# Patient Record
Sex: Female | Born: 1965 | Race: Black or African American | Hispanic: No | Marital: Married | Smoking: Never smoker
Health system: Southern US, Community
[De-identification: ages and names within clinical notes are randomized; demographics above are authoritative.]

## PROBLEM LIST (undated history)

## (undated) DIAGNOSIS — G43909 Migraine, unspecified, not intractable, without status migrainosus: Secondary | ICD-10-CM

## (undated) DIAGNOSIS — K219 Gastro-esophageal reflux disease without esophagitis: Secondary | ICD-10-CM

## (undated) HISTORY — PX: CERVIX SURGERY: SHX593

## (undated) HISTORY — PX: HERNIA REPAIR: SHX51

---

## 2015-11-17 ENCOUNTER — Ambulatory Visit (HOSPITAL_COMMUNITY)
Admission: EM | Admit: 2015-11-17 | Discharge: 2015-11-17 | Disposition: A | Discharge status: Home / Self Care | Payer: Worker's Compensation | Attending: Emergency Medicine | Admitting: Emergency Medicine

## 2015-11-17 ENCOUNTER — Ambulatory Visit (HOSPITAL_COMMUNITY): Payer: Worker's Compensation

## 2015-11-17 ENCOUNTER — Encounter (HOSPITAL_COMMUNITY): Payer: Self-pay | Admitting: Emergency Medicine

## 2015-11-17 DIAGNOSIS — Z79899 Other long term (current) drug therapy: Secondary | ICD-10-CM | POA: Insufficient documentation

## 2015-11-17 DIAGNOSIS — Z9889 Other specified postprocedural states: Secondary | ICD-10-CM | POA: Insufficient documentation

## 2015-11-17 DIAGNOSIS — M545 Low back pain: Secondary | ICD-10-CM | POA: Diagnosis present

## 2015-11-17 DIAGNOSIS — M533 Sacrococcygeal disorders, not elsewhere classified: Secondary | ICD-10-CM | POA: Insufficient documentation

## 2015-11-17 HISTORY — DX: Migraine, unspecified, not intractable, without status migrainosus: G43.909

## 2015-11-17 HISTORY — DX: Gastro-esophageal reflux disease without esophagitis: K21.9

## 2015-11-17 MED ORDER — MELOXICAM 7.5 MG PO TABS: 7.5000 mg | ORAL_TABLET | Freq: Every day | ORAL | Status: DC
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
## 2015-11-17 NOTE — ED Notes (Signed)
Patient sent to hospital radiology, called prior to leaving to notify radiology department

## 2015-11-17 NOTE — ED Notes (Addendum)
1 page work- related paper xeroxed to be scanned into record

## 2015-11-17 NOTE — ED Notes (Signed)
[facility] returned to [facility]

## 2015-11-17 NOTE — Discharge Instructions (Signed)
Tailbone Injury The tailbone is the small bone at the lower end of the backbone (spine). You may have stretched tissues, bruises, or a broken bone (fracture). These injuries can be painful. Most tailbone injuries get better on their own in 4-6 weeks. HOME CARE  Take Meloxicam daily for the next month.  Apply ice to the injured area.  Put ice in a plastic bag.  Place a towel between your skin and the bag.  Leave the ice on for 20 minutes, 2-3 times per day. Do this for the first 1-2 days.  Sit on a large, rubber or inflated ring or cushion to lessen pain. You can also use a wedge cushion. Lean forward when you sit to help lessen pain.  Avoid sitting in one place for a long time.  Increase your activity as the pain allows.  Do exercises as told by your doctor or physical therapist.  If it is painful to poop, take medicine to help you poop (stool softeners) as told by your doctor.  Eat foods that have plenty of fiber.  Keep all follow-up visits as told by your doctor. This is important. GET HELP IF:  Your pain gets worse.  Pooping causes you pain.  You cannot poop (constipation).  You are leaking pee (urinary incontinence).  You have a fever.   This information is not intended to replace advice given to you by your health care provider. Make sure you discuss any questions you have with your health care provider.   Document Released: 05/24/2010 Document Revised: 09/05/2014 Document Reviewed: 04/17/2014 Elsevier Interactive Patient Education [employer]2016 [employer]

## 2017-01-05 ENCOUNTER — Emergency Department (HOSPITAL_COMMUNITY)
Admission: EM | Admit: 2017-01-05 | Discharge: 2017-01-05 | Disposition: A | Discharge status: Home / Self Care | Payer: BLUE CROSS/BLUE SHIELD | Attending: Emergency Medicine | Admitting: Emergency Medicine

## 2017-01-05 ENCOUNTER — Emergency Department (HOSPITAL_COMMUNITY): Payer: BLUE CROSS/BLUE SHIELD

## 2017-01-05 ENCOUNTER — Encounter (HOSPITAL_COMMUNITY): Payer: Self-pay | Admitting: Emergency Medicine

## 2017-01-05 DIAGNOSIS — Z79899 Other long term (current) drug therapy: Secondary | ICD-10-CM | POA: Diagnosis not present

## 2017-01-05 DIAGNOSIS — Y999 Unspecified external cause status: Secondary | ICD-10-CM | POA: Diagnosis not present

## 2017-01-05 DIAGNOSIS — S161XXA Strain of muscle, fascia and tendon at neck level, initial encounter: Secondary | ICD-10-CM | POA: Diagnosis not present

## 2017-01-05 DIAGNOSIS — S199XXA Unspecified injury of neck, initial encounter: Secondary | ICD-10-CM | POA: Diagnosis present

## 2017-01-05 DIAGNOSIS — Y9389 Activity, other specified: Secondary | ICD-10-CM | POA: Insufficient documentation

## 2017-01-05 DIAGNOSIS — Y929 Unspecified place or not applicable: Secondary | ICD-10-CM | POA: Diagnosis not present

## 2017-01-05 DIAGNOSIS — M25512 Pain in left shoulder: Secondary | ICD-10-CM

## 2017-01-05 MED ORDER — CYCLOBENZAPRINE HCL 10 MG PO TABS: 10.0000 mg | ORAL_TABLET | Freq: Three times a day (TID) | ORAL | 0 refills | Status: DC | PRN

## 2017-01-05 MED ORDER — NAPROXEN 375 MG PO TABS: 375.0000 mg | ORAL_TABLET | Freq: Two times a day (BID) | ORAL | 0 refills | Status: DC

## 2017-01-05 NOTE — Discharge Instructions (Signed)
Your imaging has been reassuring. Likely musculoskeletal pain.  Please rest, ice, compress and elevated the affected body part to help with swelling and pain.  Please take the Naproxen as prescribed for pain. Do not take any additional NSAIDs including Motrin, Aleve, Ibuprofen, Advil.  Please the the Flexeril for muscle relaxation. This medication will make you drowsy so avoid situation that could place you in danger.   Soaking in warm bath with epson sault, and applying heat can help your pain.   Please follow-up with her primary care doctor for further imaging and referral if indicated.
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
## 2017-01-05 NOTE — ED Notes (Signed)
Pt taken to Xray.

## 2017-07-04 ENCOUNTER — Encounter (HOSPITAL_COMMUNITY): Payer: Self-pay | Admitting: Emergency Medicine

## 2017-07-04 ENCOUNTER — Ambulatory Visit (HOSPITAL_COMMUNITY)
Admission: EM | Admit: 2017-07-04 | Discharge: 2017-07-04 | Disposition: A | Discharge status: Home / Self Care | Payer: Worker's Compensation | Attending: Family Medicine | Admitting: Family Medicine

## 2017-07-04 DIAGNOSIS — S46811A Strain of other muscles, fascia and tendons at shoulder and upper arm level, right arm, initial encounter: Secondary | ICD-10-CM

## 2017-07-04 MED ORDER — TRAMADOL HCL 50 MG PO TABS: 50.0000 mg | ORAL_TABLET | Freq: Four times a day (QID) | ORAL | 0 refills | Status: AC | PRN
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
## 2017-07-04 NOTE — Discharge Instructions (Signed)
Heat (pad or rice pillow in microwave) over affected area, 10-15 minutes twice daily.   Ice/cold pack over area for 10-15 min twice daily.  OK to take Tylenol 1000 mg (2 extra strength tabs) or 975 mg (3 regular strength tabs) every 6 hours as needed.  Ibuprofen 400-600 mg (2-3 over the counter strength tabs) every 6 hours as needed for pain.  Trapezius stretches/exercises Do exercises exactly as told by your health care provider and adjust them as directed. It is normal to feel mild stretching, pulling, tightness, or discomfort as you do these exercises, but you should stop right away if you feel sudden pain or your pain gets worse.   Stretching and range of motion exercises These exercises warm up your muscles and joints and improve the movement and flexibility of your shoulder. These exercises can also help to relieve pain, numbness, and tingling. If you are unable to do any of the following for any reason, do not further attempt to do it.   Exercise A: Flexion, standing     Stand and hold a broomstick, a cane, or a similar object. Place your hands a little more than shoulder-width apart on the object. Your left / right hand should be palm-up, and your other hand should be palm-down. Push the stick to raise your left / right arm out to your side and then over your head. Use your other hand to help move the stick. Stop when you feel a stretch in your shoulder, or when you reach the angle that is recommended by your health care provider. Avoid shrugging your shoulder while you raise your arm. Keep your shoulder blade tucked down toward your spine. Hold for 30 seconds. Slowly return to the starting position. Repeat 2 times. Complete this exercise 3 times per week.  Exercise B: Abduction, supine     Lie on your back and hold a broomstick, a cane, or a similar object. Place your hands a little more than shoulder-width apart on the object. Your left / right hand should be palm-up, and your  other hand should be palm-down. Push the stick to raise your left / right arm out to your side and then over your head. Use your other hand to help move the stick. Stop when you feel a stretch in your shoulder, or when you reach the angle that is recommended by your health care provider. Avoid shrugging your shoulder while you raise your arm. Keep your shoulder blade tucked down toward your spine. Hold for 30 seconds. Slowly return to the starting position. Repeat 2 times. Complete this exercise 3 times per week.  Exercise C: Flexion, active-assisted     Lie on your back. You may bend your knees for comfort. Hold a broomstick, a cane, or a similar object. Place your hands about shoulder-width apart on the object. Your palms should face toward your feet. Raise the stick and move your arms over your head and behind your head, toward the floor. Use your healthy arm to help your left / right arm move farther. Stop when you feel a gentle stretch in your shoulder, or when you reach the angle where your health care provider tells you to stop. Hold for 30 seconds. Slowly return to the starting position. Repeat 2 times. Complete this exercise 3 times per week.  Exercise D: External rotation and abduction     Stand in a door frame with one of your feet slightly in front of the other. This is called a staggered  stance. Choose one of the following positions as told by your health care provider: Place your hands and forearms on the door frame above your head. Place your hands and forearms on the door frame at the height of your head. Place your hands on the door frame at the height of your elbows. Slowly move your weight onto your front foot until you feel a stretch across your chest and in the front of your shoulders. Keep your head and chest upright and keep your abdominal muscles tight. Hold for 30 seconds. To release the stretch, shift your weight to your back foot. Repeat 2 times. Complete this  stretch 3 times per week.  Strengthening exercises These exercises build strength and endurance in your shoulder. Endurance is the ability to use your muscles for a long time, even after your muscles get tired. Exercise E: Scapular depression and adduction  Sit on a stable chair. Support your arms in front of you with pillows, armrests, or a tabletop. Keep your elbows in line with the sides of your body. Gently move your shoulder blades down toward your middle back. Relax the muscles on the tops of your shoulders and in the back of your neck. Hold for 3 seconds. Slowly release the tension and relax your muscles completely before doing this exercise again. Repeat for a total of 10 repetitions. After you have practiced this exercise, try doing the exercise without the arm support. Then, try the exercise while standing instead of sitting. Repeat 2 times. Complete this exercise 3 times per week.  Exercise F: Shoulder abduction, isometric     Stand or sit about 4-6 inches (10-15 cm) from a wall with your left / right side facing the wall. Bend your left / right elbow and gently press your elbow against the wall. Increase the pressure slowly until you are pressing as hard as you can without shrugging your shoulder. Hold for 3 seconds. Slowly release the tension and relax your muscles completely. Repeat for a total of 10 repetitions. Repeat 2 times. Complete this exercise 3 times per week.  Exercise G: Shoulder flexion, isometric     Stand or sit about 4-6 inches (10-15 cm) away from a wall with your left / right side facing the wall. Keep your left / right elbow straight and gently press the top of your fist against the wall. Increase the pressure slowly until you are pressing as hard as you can without shrugging your shoulder. Hold for 10-15 seconds. Slowly release the tension and relax your muscles completely. Repeat for a total of 10 repetitions. Repeat 2 times. Complete this exercise 3  times per week.  Exercise H: Internal rotation     Sit in a stable chair without armrests, or stand. Secure an exercise band at your left / right side, at elbow height. Place a soft object, such as a folded towel or a small pillow, under your left / right upper arm so your elbow is a few inches (about 8 cm) away from your side. Hold the end of the exercise band so the band stretches. Keeping your elbow pressed against the soft object under your arm, move your forearm across your body toward your abdomen. Keep your body steady so the movement is only coming from your shoulder. Hold for 3 seconds. Slowly return to the starting position. Repeat for a total of 10 repetitions. Repeat 2 times. Complete this exercise 3 times per week.  Exercise I: External rotation     Sit in  a stable chair without armrests, or stand. Secure an exercise band at your left / right side, at elbow height. Place a soft object, such as a folded towel or a small pillow, under your left / right upper arm so your elbow is a few inches (about 8 cm) away from your side. Hold the end of the exercise band so the band stretches. Keeping your elbow pressed against the soft object under your arm, move your forearm out, away from your abdomen. Keep your body steady so the movement is only coming from your shoulder. Hold for 3 seconds. Slowly return to the starting position. Repeat for a total of 10 repetitions. Repeat 2 times. Complete this exercise 3 times per week. Exercise J: Shoulder extension  Sit in a stable chair without armrests, or stand. Secure an exercise band to a stable object in front of you so the band is at shoulder height. Hold one end of the exercise band in each hand. Your palms should face each other. Straighten your elbows and lift your hands up to shoulder height. Step back, away from the secured end of the exercise band, until the band stretches. Squeeze your shoulder blades together and pull your hands  down to the sides of your thighs. Stop when your hands are straight down by your sides. Do not let your hands go behind your body. Hold for 3 seconds. Slowly return to the starting position. Repeat for a total of 10 repetitions. Repeat 2 times. Complete this exercise 3 times per week.  Exercise K: Shoulder extension, prone     Lie on your abdomen on a firm surface so your left / right arm hangs over the edge. Hold a 5 lb weight in your hand so your palm faces in toward your body. Your arm should be straight. Squeeze your shoulder blade down toward the middle of your back. Slowly raise your arm behind you, up to the height of the surface that you are lying on. Keep your arm straight. Hold for 3 seconds. Slowly return to the starting position and relax your muscles. Repeat for a total of 10 repetitions. Repeat 2 times. Complete this exercise 3 times per week.   Exercise L: Horizontal abduction, prone  Lie on your abdomen on a firm surface so your left / right arm hangs over the edge. Hold a 5 lb weight in your hand so your palm faces toward your feet. Your arm should be straight. Squeeze your shoulder blade down toward the middle of your back. Bend your elbow so your hand moves up, until your elbow is bent to an "L" shape (90 degrees). With your elbow bent, slowly move your forearm forward and up. Raise your hand up to the height of the surface that you are lying on. Your upper arm should not move, and your elbow should stay bent. At the top of the movement, your palm should face the floor. Hold for 3 seconds. Slowly return to the starting position and relax your muscles. Repeat for a total of 10 repetitions. Repeat 2 times. Complete this exercise 3 times per week.  Exercise M: Horizontal abduction, standing  Sit on a stable chair, or stand. Secure an exercise band to a stable object in front of you so the band is at shoulder height. Hold one end of the exercise band in each  hand. Straighten your elbows and lift your hands straight in front of you, up to shoulder height. Your palms should face down, toward the floor.  Step back, away from the secured end of the exercise band, until the band stretches. Move your arms out to your sides, and keep your arms straight. Hold for 3 seconds. Slowly return to the starting position. Repeat for a total of 10 repetitions. Repeat 2 times. Complete this exercise 3 times per week.  Exercise N: Scapular retraction and elevation  Sit on a stable chair, or stand. Secure an exercise band to a stable object in front of you so the band is at shoulder height. Hold one end of the exercise band in each hand. Your palms should face each other. Sit in a stable chair without armrests, or stand. Step back, away from the secured end of the exercise band, until the band stretches. Squeeze your shoulder blades together and lift your hands over your head. Keep your elbows straight. Hold for 3 seconds. Slowly return to the starting position. Repeat for a total of 10 repetitions. Repeat 2 times. Complete this exercise 3 times per week.  This information is not intended to replace advice given to you by your health care provider. Make sure you discuss any questions you have with your health care provider. Document Released: 04/21/2005 Document Revised: 12/27/2015 Document Reviewed: 03/08/2015 Elsevier Interactive Patient Education  2017 [employer]

## 2017-07-04 NOTE — ED Provider Notes (Addendum)
  [facility]    CSN: [id number] Arrival date & time: 07/04/17  1201  Musculoskeletal Exam  Patient: Carla Hines DOB: 07/18/1965  DOS: 07/04/2017  SUBJECTIVE:  Chief Complaint:   Chief Complaint  Patient presents with  . Arm Pain    Carla Hines is a 52 y.o.  female for evaluation and treatment of R arm pain.   Onset:  2 days ago.  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  Location: shoulder Character:  aching  Progression of issue:  is unchanged Associated symptoms: None Radiates to neck and down arm Treatment: to date has been OTC NSAIDS.   Neurovascular symptoms: no  ROS: Musculoskeletal/Extremities: +r arm pain Neuro: No numbness or tingling  Past Medical History:  Diagnosis Date  . Acid reflux   . Migraines     Objective: VITAL SIGNS: BP 103/63 (BP Location: Left Arm)   Pulse 74   Temp (!) 97.4 F (36.3 C)   Resp 18   SpO2 100%  Constitutional: Well formed, well developed. No acute distress. Cardiovascular: Brisk cap refill Thorax & Lungs: No accessory muscle use Musculoskeletal: R shoulder.   Normal active range of motion: yes.   Normal passive range of motion: yes Tenderness to palpation: yes, over the trapezius Deformity: no Ecchymosis: no Tests positive: none Tests negative: Neer's, Hawkins, empty can, liftoff, crossover, speeds, O'Brien's, Spurling's Neurologic: Normal sensory function. No focal deficits noted. DTR's equal and symmetry in UE's. No clonus. Psychiatric: Normal mood. Age appropriate judgment and insight. Alert & oriented x 3.    Assessment:  Strain of right trapezius muscle, initial encounter  Plan: Tramadol 50 mg, 1 tab q 12 hrs prn pain, disp 12. Do not drink alcohol, do any illicit/street drugs, drive or do anything that requires alertness while on this medicine.  Cont NSAIDs, Tylenol, ice, heat, stretches/exercises for trap given. Light duty for overhead,  pushing, and lifting for next week. F/u w occ health in 1 week if no better. The patient voiced understanding and agreement to the plan.    Sharlene DoryWendling, Nicholas Paul, DO 07/04/17 1239    Sharlene DoryWendling, Nicholas Paul, DO 07/04/17 1240

## 2020-01-07 ENCOUNTER — Emergency Department (HOSPITAL_COMMUNITY)
Admission: EM | Admit: 2020-01-07 | Discharge: 2020-01-08 | Disposition: A | Discharge status: Home / Self Care | Payer: Worker's Compensation | Attending: Emergency Medicine | Admitting: Emergency Medicine

## 2020-01-07 ENCOUNTER — Other Ambulatory Visit: Payer: Self-pay

## 2020-01-07 DIAGNOSIS — M25551 Pain in right hip: Secondary | ICD-10-CM | POA: Diagnosis present

## 2020-01-07 DIAGNOSIS — M62838 Other muscle spasm: Secondary | ICD-10-CM | POA: Diagnosis not present

## 2020-01-07 DIAGNOSIS — R519 Headache, unspecified: Secondary | ICD-10-CM | POA: Diagnosis not present

## 2020-01-07 DIAGNOSIS — M2550 Pain in unspecified joint: Secondary | ICD-10-CM | POA: Diagnosis not present

## 2020-01-07 DIAGNOSIS — W19XXXA Unspecified fall, initial encounter: Secondary | ICD-10-CM

## 2020-01-07 DIAGNOSIS — M549 Dorsalgia, unspecified: Secondary | ICD-10-CM | POA: Diagnosis not present

## 2020-01-07 DIAGNOSIS — M542 Cervicalgia: Secondary | ICD-10-CM | POA: Insufficient documentation
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
## 2020-01-08 ENCOUNTER — Emergency Department (HOSPITAL_COMMUNITY): Payer: BLUE CROSS/BLUE SHIELD

## 2020-01-08 ENCOUNTER — Emergency Department (HOSPITAL_COMMUNITY): Payer: Worker's Compensation

## 2020-01-08 MED ORDER — IBUPROFEN 400 MG PO TABS
600.0000 mg | ORAL_TABLET | Freq: Once | ORAL | Status: AC
  Administered 2020-01-08: 600 mg via ORAL
  Filled 2020-01-08: qty 1

## 2020-01-08 MED ORDER — MELOXICAM 7.5 MG PO TABS: 7.5000 mg | ORAL_TABLET | Freq: Every day | ORAL | 0 refills | Status: AC

## 2020-01-08 MED ORDER — METHOCARBAMOL 500 MG PO TABS: 500.0000 mg | ORAL_TABLET | Freq: Two times a day (BID) | ORAL | 0 refills | Status: AC

## 2020-01-08 NOTE — Discharge Instructions (Signed)
Take meloxicam and Robaxin as needed as prescribed for pain. Apply warm compresses to sore muscles followed by gentle stretching. Recheck with your Worker's Comp. provider.
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
## 2020-01-08 NOTE — ED Notes (Signed)
Pt in xray

## 2020-07-17 ENCOUNTER — Emergency Department (HOSPITAL_COMMUNITY)
Admission: EM | Admit: 2020-07-17 | Discharge: 2020-07-17 | Disposition: A | Discharge status: Home / Self Care | Payer: BC Managed Care – PPO | Attending: Emergency Medicine | Admitting: Emergency Medicine

## 2020-07-17 ENCOUNTER — Emergency Department (HOSPITAL_COMMUNITY): Payer: BC Managed Care – PPO

## 2020-07-17 ENCOUNTER — Other Ambulatory Visit: Payer: Self-pay

## 2020-07-17 ENCOUNTER — Encounter (HOSPITAL_COMMUNITY): Payer: Self-pay | Admitting: Emergency Medicine

## 2020-07-17 DIAGNOSIS — R112 Nausea with vomiting, unspecified: Secondary | ICD-10-CM | POA: Insufficient documentation

## 2020-07-17 DIAGNOSIS — R55 Syncope and collapse: Secondary | ICD-10-CM

## 2020-07-17 DIAGNOSIS — R61 Generalized hyperhidrosis: Secondary | ICD-10-CM | POA: Diagnosis not present

## 2020-07-17 LAB — CBC
HCT: 36.8 % (ref 36.0–46.0)
Hemoglobin: 12.1 g/dL (ref 12.0–15.0)
MCH: 30 pg (ref 26.0–34.0)
MCHC: 32.9 g/dL (ref 30.0–36.0)
MCV: 91.3 fL (ref 80.0–100.0)
Platelets: 295 10*3/uL (ref 150–400)
RBC: 4.03 MIL/uL (ref 3.87–5.11)
RDW: 12.6 % (ref 11.5–15.5)
WBC: 5 10*3/uL (ref 4.0–10.5)
nRBC: 0 % (ref 0.0–0.2)

## 2020-07-17 LAB — COMPREHENSIVE METABOLIC PANEL
ALT: 13 U/L (ref 0–44)
AST: 23 U/L (ref 15–41)
Albumin: 4.5 g/dL (ref 3.5–5.0)
Alkaline Phosphatase: 58 U/L (ref 38–126)
Anion gap: 8 (ref 5–15)
BUN: 13 mg/dL (ref 6–20)
CO2: 27 mmol/L (ref 22–32)
Calcium: 10 mg/dL (ref 8.9–10.3)
Chloride: 106 mmol/L (ref 98–111)
Creatinine, Ser: 0.99 mg/dL (ref 0.44–1.00)
GFR, Estimated: >60 mL/min (ref >60)
Glucose, Bld: 99 mg/dL (ref 70–99)
Potassium: 3.7 mmol/L (ref 3.5–5.1)
Sodium: 141 mmol/L (ref 135–145)
Total Bilirubin: 0.9 mg/dL (ref 0.3–1.2)
Total Protein: 7.2 g/dL (ref 6.5–8.1)

## 2020-07-17 LAB — URINALYSIS, ROUTINE W REFLEX MICROSCOPIC
Bacteria, UA: NONE SEEN
Bilirubin Urine: NEGATIVE
Glucose, UA: NEGATIVE mg/dL
Hgb urine dipstick: NEGATIVE
Ketones, ur: 20 mg/dL — AB
Leukocytes,Ua: NEGATIVE
Nitrite: NEGATIVE
Protein, ur: 30 mg/dL — AB
Specific Gravity, Urine: 1.026 (ref 1.005–1.030)
pH: 7 (ref 5.0–8.0)

## 2020-07-17 LAB — LIPASE, BLOOD: Lipase: 31 U/L (ref 11–51)

## 2020-07-17 LAB — CBG MONITORING, ED: Glucose-Capillary: 82 mg/dL (ref 70–99)

## 2020-07-17 LAB — I-STAT BETA HCG BLOOD, ED (MC, WL, AP ONLY): I-stat hCG, quantitative: <5 m[IU]/mL (ref <5)

## 2020-07-17 MED ORDER — SODIUM CHLORIDE 0.9 % IV BOLUS
1000.0000 mL | Freq: Once | INTRAVENOUS | Status: AC
  Administered 2020-07-17: 1000 mL via INTRAVENOUS

## 2020-07-17 MED ORDER — ONDANSETRON 4 MG PO TBDP: 4.0000 mg | ORAL_TABLET | Freq: Three times a day (TID) | ORAL | 0 refills | Status: AC | PRN

## 2020-07-17 NOTE — ED Notes (Signed)
Passed fluid challenge, no nausea or vomitting

## 2020-07-17 NOTE — ED Triage Notes (Addendum)
Arrived via EMS patient became hot and nauseated. Emesis x 2 and felt like she was going to pass out at work. States lower abdominal pain 4/10 achy. EMS administered Zofran 4mg  IVP.

## 2020-07-17 NOTE — ED Provider Notes (Signed)
[facility] Provider Note   CSN: [id number] Arrival date & time: 07/17/20  1412     History Chief Complaint  Patient presents with  . Near Syncope  . Emesis  . Nausea    Carla Hines is a 55 y.o. female.  HPI Patient is a 55 year old female who presents the emergency department due to a near syncopal episode that happened just prior to arrival.  Patient states that she was at work and was working with a [employer].  She did not feel that she was exerting herself.  She states that she became hot/diaphoretic and immediately became nauseated.  She had multiple episodes of nonbloody emesis.  She reports some mild abdominal pain when this occurred but this is since resolved.  She was given Zofran by EMS and has no current nausea.  She states when this occurs she became lightheaded/dizzy.  This lasted for about 30 seconds.  She did not syncopized.  She states that she had to use the wall to help her self stay on her feet but denies any head trauma or LOC.  She is not anticoagulated.  She also notes that she typically eats around 10 AM but did not eat today.  She has a history of orthostatic hypotension and takes Florinef every 3 days for recurrent hypotension.  Her last dose was 2 days ago.  She denies any chest pain or shortness of breath before, during, or after the incident.  Denies any incontinence.       Past Medical History:  Diagnosis Date  . Acid reflux   . Migraines     There are no problems to display for this patient.   Past Surgical History:  Procedure Laterality Date  . CERVIX SURGERY    . HERNIA REPAIR       OB History   No obstetric history on file.     Family History  Problem Relation Age of Onset  . Dementia Mother   . Hypertension Mother     Social History   Tobacco Use  . Smoking status: Never Smoker  . Smokeless tobacco: Never Used  Substance Use Topics  . Alcohol use: No  . Drug use: No    Home  Medications Prior to Admission medications   Medication Sig Start Date End Date Taking? Authorizing Provider  ondansetron (ZOFRAN ODT) 4 MG disintegrating tablet Take 1 tablet (4 mg total) by mouth every 8 (eight) hours as needed for nausea or vomiting. 07/17/20  Yes Placido SouJoldersma, Logan, PA-C  cholecalciferol (VITAMIN D) 1000 units tablet Take 3,000 Units by mouth daily.    [provider]  methocarbamol (ROBAXIN) 500 MG tablet Take 1 tablet (500 mg total) by mouth 2 (two) times daily. 01/08/20   Jeannie FendMurphy, Laura A, PA-C  OMEGA-3 FATTY ACIDS PO Take by mouth.    [provider]  omeprazole (PRILOSEC) 20 MG capsule Take 20 mg by mouth daily as needed (acid reflux).     [provider]  Prenatal Vit-Fe Fumarate-FA (PRENATAL MULTIVITAMIN) TABS tablet Take 1 tablet by mouth every morning.     [provider]  traMADol (ULTRAM) 50 MG tablet Take 1 tablet (50 mg total) by mouth every 6 (six) hours as needed. 07/04/17   Sharlene DoryWendling, Nicholas Paul, DO  vitamin B-12 (CYANOCOBALAMIN) 1000 MCG tablet Take 1,000 mcg by mouth daily.    [provider]    Allergies    Other, Prednisone, Promethazine, Phenergan [promethazine hcl], and Reglan [metoclopramide]  Review  of Systems   Review of Systems  All other systems reviewed and are negative. Ten systems reviewed and are negative for acute change, except as noted in the HPI.   Physical Exam Updated Vital Signs BP 102/72   Pulse 69   Temp 98.4 F (36.9 C) (Oral)   Resp 13   Ht 5\' 4"  (1.626 m)   Wt 63.5 kg   SpO2 100%   BMI 24.03 kg/m   Physical Exam Vitals and nursing note reviewed.  Constitutional:      General: She is not in acute distress.    Appearance: Normal appearance. She is normal weight. She is not ill-appearing, toxic-appearing or diaphoretic.  HENT:     Head: Normocephalic and atraumatic.     Right Ear: External ear normal.     Left Ear: External ear normal.     Nose: Nose normal.     Mouth/Throat:      Mouth: Mucous membranes are moist.     Pharynx: Oropharynx is clear. No oropharyngeal exudate or posterior oropharyngeal erythema.  Eyes:     General: No scleral icterus.       Right eye: No discharge.        Left eye: No discharge.     Extraocular Movements: Extraocular movements intact.     Conjunctiva/sclera: Conjunctivae normal.  Cardiovascular:     Rate and Rhythm: Normal rate and regular rhythm.     Pulses: Normal pulses.     Heart sounds: Normal heart sounds. No murmur heard. No friction rub. No gallop.      Comments: Regular rate and rhythm without murmurs, rubs, or gallops.  Orthostatic Vital Signs:  BP lying is 118/72 BP sitting is 115/73. Standing is 114/76. Standing at 2 minutes is 111/84.   Patient asymptomatic while standing.  Pulmonary:     Effort: Pulmonary effort is normal. No respiratory distress.     Breath sounds: Normal breath sounds. No stridor. No wheezing, rhonchi or rales.     Comments: Lungs are clear to auscultation bilaterally. Abdominal:     General: Abdomen is flat.     Tenderness: There is no abdominal tenderness.  Musculoskeletal:        General: Normal range of motion.     Cervical back: Normal range of motion and neck supple. No tenderness.  Skin:    General: Skin is warm and dry.  Neurological:     General: No focal deficit present.     Mental Status: She is alert and oriented to person, place, and time.  Psychiatric:        Mood and Affect: Mood normal.        Behavior: Behavior normal.    ED Results / Procedures / Treatments   Labs (all labs ordered are listed, but only abnormal results are displayed) Labs Reviewed  URINALYSIS, ROUTINE W REFLEX MICROSCOPIC - Abnormal; Notable for the following components:      Result Value   Ketones, ur 20 (*)    Protein, ur 30 (*)    All other components within normal limits  CBC  LIPASE, BLOOD  COMPREHENSIVE METABOLIC PANEL  CBG MONITORING, ED  I-STAT BETA HCG BLOOD, ED ([facility], [facility], [facility]  ONLY)   EKG EKG Interpretation  Date/Time:  Tuesday July 17 2020 14:21:16 EDT Ventricular Rate:  67 PR Interval:  158 QRS Duration: 80 QT Interval:  408 QTC Calculation: 431 R Axis:   20 Text Interpretation: Normal sinus rhythm Normal ECG Confirmed by [id number] (Marianna Fuss)  on 07/17/2020 4:14:09 PM  Radiology DG Chest 2 View  Result Date: 07/17/2020 CLINICAL DATA:  Syncope. EXAM: CHEST - 2 VIEW COMPARISON:  May 09, 2020. FINDINGS: The heart size and mediastinal contours are within normal limits. Both lungs are clear. The visualized skeletal structures are unremarkable. IMPRESSION: No active cardiopulmonary disease. Electronically Signed   By: Lupita Raider M.D.   On: 07/17/2020 16:53   CT Head Wo Contrast  Result Date: 07/17/2020 CLINICAL DATA:  Dizziness, nonspecific. Additional history provided: Near syncopal episode today, patient reports nausea and dizziness. EXAM: CT HEAD WITHOUT CONTRAST TECHNIQUE: Contiguous axial images were obtained from the base of the skull through the vertex without intravenous contrast. COMPARISON:  Report from head CT 03/21/2013 (images unavailable). FINDINGS: Brain: Cerebral volume is normal. There is no acute intracranial hemorrhage. No demarcated cortical infarct. No extra-axial fluid collection. No evidence of intracranial mass. No midline shift. Vascular: No hyperdense vessel. Skull: Normal. Negative for fracture or focal lesion. Sinuses/Orbits: Visualized orbits show no acute finding. No significant paranasal sinus disease at the imaged levels. IMPRESSION: No evidence of acute intracranial abnormality. Electronically Signed   By: Jackey Loge DO   On: 07/17/2020 16:55    Procedures Procedures   Medications Ordered in ED Medications  sodium chloride 0.9 % bolus 1,000 mL (1,000 mLs Intravenous New Bag/Given 07/17/20 1557)    ED Course  I have reviewed the triage vital signs and the nursing notes.  Pertinent labs & imaging results that were  available during my care of the patient were reviewed by me and considered in my medical decision making (see chart for details).  Clinical Course as of 07/17/20 1846  Tue Jul 17, 2020  1756 Patient reassessed.  She states she is feeling much better after getting IV fluids.  We will have the nursing staff p.o. challenge her.  Will likely discharge. [LJ]    Clinical Course User Index [LJ] Placido Sou, PA-C   MDM Rules/Calculators/A&P                          Pt is a 55 y.o. female who presents the emergency department due to a near syncopal episode as well as multiple episodes of nausea and vomiting that occurred prior to arrival.  Labs: CBC without abnormalities. CMP without abnormalities. CBG of 82. UA shows 20 ketones and 30 protein. Lipase of 31.  Imaging: Chest x-ray is negative. CT scan of the head is negative.  I, Placido Sou, PA-C, personally reviewed and evaluated these images and lab results as part of my medical decision-making.  Feel that patient's symptoms today are likely vasovagal.  She notes feeling increased warmth/diaphoresis and then experienced several episodes of vomiting prior to her near syncopal episode.  She typically eats breakfast and states that she did skip that meal this morning.  She does also have a history of orthostatic hypotension but does not appear to be orthostatic in the ED.  Orthostatic vital signs were reassuring and patient had no lightheadedness.  I gave her a liter of IV fluids in addition to the Zofran that she was already given.  She states she is feeling quite a bit better.  Successfully p.o. challenged.  Feel that she is stable for discharge and she is agreeable.  We will give her a short course of Zofran for breakthrough nausea and vomiting.  Urged her to make sure that she is staying hydrated and eating adequately.  Recommended that she  follow-up with her PCP tomorrow as well as take her prescribed fludrocortisone.  We discussed  return precautions in length.  Her questions were answered and she was amicable to time of discharge.  Her vital signs are stable.  Note: Portions of this report may have been transcribed using voice recognition software. Every effort was made to ensure accuracy; however, inadvertent computerized transcription errors may be present.    Final Clinical Impression(s) / ED Diagnoses Final diagnoses:  Near syncope  Non-intractable vomiting with nausea, unspecified vomiting type    Rx / DC Orders ED Discharge Orders         Ordered    ondansetron (ZOFRAN ODT) 4 MG disintegrating tablet  Every 8 hours PRN        07/17/20 1842           Placido Sou, PA-C 07/17/20 1846    Milagros Loll, MD 07/20/20 [id number]

## 2020-07-17 NOTE — Discharge Instructions (Addendum)
Please follow-up with your regular doctor tomorrow.  I think that you likely experienced a vasovagal episode earlier today.  Although your vital signs did not appear to show orthostatic hypotension, you do have a history of orthostatic hypotension and that could have been the cause of your fainting spell earlier today.  I am also prescribing you a short course of a medication called Zofran.  You can take this up to 3 times a day for worsening nausea and vomiting.  Please make sure that you eat a large meal and stay adequately hydrated.  If your symptoms worsen again, you can return to the emergency department for reevaluation.  It was a pleasure to meet you.

## 2020-09-15 ENCOUNTER — Ambulatory Visit (HOSPITAL_COMMUNITY)
Admission: EM | Admit: 2020-09-15 | Discharge: 2020-09-15 | Disposition: A | Discharge status: Home / Self Care | Payer: Worker's Compensation

## 2020-09-15 ENCOUNTER — Other Ambulatory Visit: Payer: Self-pay

## 2020-09-15 ENCOUNTER — Encounter (HOSPITAL_COMMUNITY): Payer: Self-pay | Admitting: *Deleted

## 2020-09-15 DIAGNOSIS — S161XXA Strain of muscle, fascia and tendon at neck level, initial encounter: Secondary | ICD-10-CM | POA: Diagnosis not present

## 2020-09-15 NOTE — ED Provider Notes (Signed)
[facility]    CSN: [id number] Arrival date & time: 09/15/20  1257      History   Chief Complaint Chief Complaint  Patient presents with  . Neck Pain    Lower back  . Back Pain  . Hypertension    HPI Carla Hines is a 55 y.o. female.   Patient here today with acute on chronic bilateral neck pain and low back pain that has been ongoing since about 7 months ago when she was injured on the job.  She states she was just released back to work from her [employer]. case through orthopedics and her pain has been significantly worse since going back.  She also recently stopped physical therapy which she states was helping quite a bit while she was taking it.  Has good range of motion and no radiation of pain, numbness, tingling down upper extremities or lower extremities.  No bowel or bladder incontinence.  No new injuries.  Taking her muscle relaxers and ibuprofen with mild relief of symptoms temporarily.     Past Medical History:  Diagnosis Date  . Acid reflux   . Migraines     There are no problems to display for this patient.   Past Surgical History:  Procedure Laterality Date  . CERVIX SURGERY    . HERNIA REPAIR      OB History   No obstetric history on file.      Home Medications    Prior to Admission medications   Medication Sig Start Date End Date Taking? Authorizing Provider  cholecalciferol (VITAMIN D) 1000 units tablet Take 3,000 Units by mouth daily.   Yes [provider]  cyclobenzaprine (FLEXERIL) 5 MG tablet Take 5 mg by mouth 3 (three) times daily as needed for muscle spasms.   Yes [provider]  OMEGA-3 FATTY ACIDS PO Take by mouth.   Yes [provider]  Prenatal Vit-Fe Fumarate-FA (PRENATAL MULTIVITAMIN) TABS tablet Take 1 tablet by mouth every morning.    Yes [provider]  vitamin B-12 (CYANOCOBALAMIN) 1000 MCG tablet Take 1,000 mcg by mouth daily.   Yes [provider]   methocarbamol (ROBAXIN) 500 MG tablet Take 1 tablet (500 mg total) by mouth 2 (two) times daily. 01/08/20   Jeannie Fend, PA-C  omeprazole (PRILOSEC) 20 MG capsule Take 20 mg by mouth daily as needed (acid reflux).     [provider]  ondansetron (ZOFRAN ODT) 4 MG disintegrating tablet Take 1 tablet (4 mg total) by mouth every 8 (eight) hours as needed for nausea or vomiting. 07/17/20   Placido Sou, PA-C  traMADol (ULTRAM) 50 MG tablet Take 1 tablet (50 mg total) by mouth every 6 (six) hours as needed. 07/04/17   Sharlene Dory, DO    Family History Family History  Problem Relation Age of Onset  . Dementia Mother   . Hypertension Mother     Social History Social History   Tobacco Use  . Smoking status: Never Smoker  . Smokeless tobacco: Never Used  Substance Use Topics  . Alcohol use: No  . Drug use: No     Allergies   Other, Prednisone, Promethazine, Phenergan [promethazine hcl], and Reglan [metoclopramide]   Review of Systems Review of Systems Per HPI  Physical Exam Triage Vital Signs ED Triage Vitals  Enc Vitals Group     BP 09/15/20 1340 117/77     Pulse Rate 09/15/20 1340 74     Resp 09/15/20 1340 18  Temp 09/15/20 1340 99 F (37.2 C)     Temp src --      SpO2 09/15/20 1340 100 %     Weight --      Height --      Head Circumference --      Peak Flow --      Pain Score 09/15/20 1342 5     Pain Loc --      Pain Edu? --      Excl. in GC? --    No data found.  Updated Vital Signs BP 117/77   Pulse 74   Temp 99 F (37.2 C)   Resp 18   SpO2 100%   Visual Acuity Right Eye Distance:   Left Eye Distance:   Bilateral Distance:    Right Eye Near:   Left Eye Near:    Bilateral Near:     Physical Exam Vitals and nursing note reviewed.  Constitutional:      Appearance: Normal appearance. She is not ill-appearing.  HENT:     Head: Atraumatic.     Mouth/Throat:     Mouth: Mucous membranes are moist.     Pharynx:  Oropharynx is clear.  Eyes:     Extraocular Movements: Extraocular movements intact.     Conjunctiva/sclera: Conjunctivae normal.  Cardiovascular:     Rate and Rhythm: Normal rate and regular rhythm.     Heart sounds: Normal heart sounds.  Pulmonary:     Effort: Pulmonary effort is normal.     Breath sounds: Normal breath sounds. No wheezing or rales.  Abdominal:     General: Bowel sounds are normal. There is no distension.     Palpations: Abdomen is soft.     Tenderness: There is no abdominal tenderness. There is no guarding.  Musculoskeletal:        General: Tenderness present. Normal range of motion.     Cervical back: Normal range of motion and neck supple.     Comments: Bilateral cervical and lumbar paraspinal muscles tender to palpation equally No midline spinal tenderness palpation diffusely Negative straight leg raise bilaterally Strength full and equal all 4 extremities  Skin:    General: Skin is warm and dry.  Neurological:     Mental Status: She is alert and oriented to person, place, and time.     Comments: All 4 extremities neurovascularly intact  Psychiatric:        Mood and Affect: Mood normal.        Thought Content: Thought content normal.        Judgment: Judgment normal.      UC Treatments / Results  Labs (all labs ordered are listed, but only abnormal results are displayed) Labs Reviewed - No data to display  EKG   Radiology No results found.  Procedures Procedures (including critical care time)  Medications Ordered in UC Medications - No data to display  Initial Impression / Assessment and Plan / UC Course  I have reviewed the triage vital signs and the nursing notes.  Pertinent labs & imaging results that were available during my care of the patient were reviewed by me and considered in my medical decision making (see chart for details).     Intolerant to prednisone, gabapentin and already has muscle relaxers and NSAIDs at home.  We will  send a work note for rest and have her follow-up first thing next week with her orthopedist for further management.  Discussed topical medications, supportive home care.  Return for acutely worsening symptoms.  Final Clinical Impressions(s) / UC Diagnoses   Final diagnoses:  Strain of neck muscle, initial encounter   Discharge Instructions   None    ED Prescriptions    None     PDMP not reviewed this encounter.   Particia Nearing, [place] 09/15/20 1411

## 2020-09-15 NOTE — ED Triage Notes (Signed)
Pt reports neck and lower back pain . Pt had a workers comp on sept-2021. Pt just released from Dr and reports pain is worse. Pt also  Reports BP high today.

## 2021-02-27 ENCOUNTER — Ambulatory Visit: Payer: Medicaid Other | Attending: Physical Medicine and Rehabilitation | Admitting: Physical Therapy

## 2021-02-27 ENCOUNTER — Encounter: Payer: Self-pay | Admitting: Physical Therapy

## 2021-02-27 ENCOUNTER — Other Ambulatory Visit: Payer: Self-pay

## 2021-02-27 DIAGNOSIS — R262 Difficulty in walking, not elsewhere classified: Secondary | ICD-10-CM | POA: Insufficient documentation

## 2021-02-27 DIAGNOSIS — M5441 Lumbago with sciatica, right side: Secondary | ICD-10-CM | POA: Diagnosis present

## 2021-02-27 DIAGNOSIS — G8929 Other chronic pain: Secondary | ICD-10-CM | POA: Diagnosis present

## 2021-02-27 DIAGNOSIS — M62838 Other muscle spasm: Secondary | ICD-10-CM | POA: Insufficient documentation

## 2021-02-27 DIAGNOSIS — M542 Cervicalgia: Secondary | ICD-10-CM | POA: Insufficient documentation

## 2021-02-27 DIAGNOSIS — M6281 Muscle weakness (generalized): Secondary | ICD-10-CM | POA: Diagnosis present

## 2021-02-27 DIAGNOSIS — M5442 Lumbago with sciatica, left side: Secondary | ICD-10-CM | POA: Diagnosis present

## 2021-02-27 NOTE — Therapy (Signed)
[facility] Outpatient Rehabilitation [facility] [address]  [address] [place], [place], [address] Phone: [phone]   Fax:  [phone]  Physical Therapy Evaluation  Patient Details  Name: Carla Hines MRN: [id number] Date of Birth: 24-Oct-1965 Referring Provider (PT): Claria Dice, MD   Encounter Date: 02/27/2021   PT End of Session - 02/27/21 1342     Visit Number 1    Number of Visits 12    Date for PT Re-Evaluation 04/10/21    Authorization Type [employer]    Progress Note Due on Visit 10    PT Start Time 1020    PT Stop Time 1100    PT Time Calculation (min) 40 min    Activity Tolerance Patient tolerated treatment well    Behavior During Therapy [facility] for tasks assessed/performed             Past Medical History:  Diagnosis Date   Acid reflux    Migraines     Past Surgical History:  Procedure Laterality Date   CERVIX SURGERY     HERNIA REPAIR      There were no vitals filed for this visit.    Subjective Assessment - 02/27/21 1023     Subjective Patient reports pain started from work injury started 01/07/2020- fall while restraining patient, x-rays unremarkable at time.   [facility], and between PT and medication, felt significant improvement and was discharged.  However, since she was discharged the pain has returned, and while her R hip is feeling better after injections pain in starting to go down her L leg as well.  She also reports on/off radicular symptoms down her R arm to fingers 3-5.  She reports limitations with all activities due to pain, misses being able to do activities like camping, field days and walking.  She also was let go from position due to limitations for lifting/pushing and pulling, and is having difficulty finding new job as a result of these limitations.    Pertinent History chronic neck and back pain, orthostatic hypotension    Limitations Sitting;Standing;Walking    How long can you  sit comfortably? 30 minutes - has to recline seate while waiting in carpool line due to pain.    How long can you stand comfortably? 30 minutes    How long can you walk comfortably? couple blocks with stick    Diagnostic tests X-rays- unavailable for review.  Reports "leaking" disc in back and bulging disc in neck    Patient Stated Goals being able to function more and longer period of time    Currently in Pain? Yes    Pain Score 3    3/10 constant baseline   Pain Location Neck    Pain Orientation Mid;Right;Left;Posterior    Pain Descriptors / Indicators Dull;Aching   sharp intermittant, headaches   Pain Type Acute pain;Chronic pain    Pain Radiating Towards R arm, tingling fingers 3-5 when bad    Pain Onset Other (comment)   01/07/2020   Pain Frequency Constant    Aggravating Factors  moving around    Pain Relieving Factors laying down, pain medication    Multiple Pain Sites Yes    Pain Score 4    Pain Location Back    Pain Orientation Mid;Lower    Pain Descriptors / Indicators Aching    Pain Type Acute pain;Chronic pain    Pain Radiating Towards used to radiate down the R leg but stopped  with shots, now starting to radiate down the left    Pain Onset Other (comment)   September 2021   Pain Frequency Constant    Aggravating Factors  moving, seating long periods, walking too much, bending too much, driving    Pain Relieving Factors laying down, pain medication, physical therapy    Effect of Pain on Daily Activities can't do everything she wants to do, interferes with routine                [facility] PT Assessment - 02/27/21 0001       Assessment   Medical Diagnosis M54.2 (ICD-10-CM) - Cervical pain  M54.50 (ICD-10-CM) - Lumbar pain    Referring Provider (PT) Claria Dice, MD    Onset Date/Surgical Date 01/16/21    Hand Dominance Right    Next MD Visit 03/26/2021 with Dr. Luiz Blare (orthopedist)    Prior Therapy yes at [facility]      Precautions   Precautions Back    Precaution  Comments For work : no pushing or lifting more than 25 pounds, no lifting overhead      Restrictions   Weight Bearing Restrictions No      Balance Screen   Has the patient fallen in the past 6 months No    Has the patient had a decrease in activity level because of a fear of falling?  No    Is the patient reluctant to leave their home because of a fear of falling?  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    Focus on Therapeutic Outcomes (FOTO)  lumbar: 39   predicted outcome after 11 visits 50     ROM / Strength   AROM / PROM / Strength AROM;Strength      AROM   Overall AROM  Deficits    Overall AROM Comments tested in sitting    AROM Assessment Site Cervical    Cervical Flexion 45   increased pain endrange   Cervical Extension 45   tightness   Cervical - Right Side Bend 20   tightness   Cervical - Left Side Bend 20   tightness   Cervical - Right Rotation 60   tight   Cervical - Left Rotation 65      Strength   Overall Strength Within functional limits for tasks performed    Overall Strength Comments tested in sitting, full force not applied to  avoid pain provocation    Strength Assessment Site Shoulder;Elbow;Forearm;Wrist;Hand;Hip;Knee;Ankle    Right/Left Shoulder Right;Left    Right Shoulder Flexion 4+/5  Right Shoulder Internal Rotation 4+/5    Right Shoulder External Rotation 4+/5    Left Shoulder Flexion 4+/5    Left Shoulder Internal Rotation 4+/5    Left Shoulder External Rotation 4+/5    Right/Left Elbow Right;Left    Right Elbow Flexion 4+/5    Right Elbow Extension 4+/5    Left Elbow Flexion 4+/5    Left Elbow Extension 4+/5    Right/Left Wrist Right;Left    Right Wrist Flexion 4+/5    Left Wrist Flexion 4+/5    Right/Left hand Right;Left    Right Hand Gross Grasp Functional    Left Hand Gross Grasp Functional    Right/Left Hip Right;Left    Right Hip Flexion 4/5   very gaurded tremorous movements   Right Hip ABduction 5/5    Right Hip ADduction 5/5    Left Hip Flexion 4/5   guarded tremorous movements   Left Hip ABduction 5/5    Left Hip ADduction 5/5    Right/Left Knee Right;Left    Right Knee Flexion 5/5    Right Knee Extension 5/5    Left Knee Flexion 5/5    Left Knee Extension 5/5    Right/Left Ankle Right;Left    Right Ankle Dorsiflexion 4+/5    Left Ankle Dorsiflexion 4+/5      Palpation   Palpation comment tenderness throughout cervical paraspinals (R C5/6 multifidi), bil UT/levator scapula.  Back not assessed today due to time.      Transfers   Five time sit to stand comments  24 seconds, very slow and guarded, reports feeling sick and nauseous afterwords followed by pain.                        Objective measurements completed on examination: See above findings.                PT Education - 02/27/21 1340     Education Details education on findings and plan of care    Person(s) Educated Patient    Methods Explanation    Comprehension Verbalized understanding              PT Short Term Goals - 02/27/21 1350       PT SHORT TERM GOAL #1   Title  Independent with initial HEP    Time 2    Period Weeks    Status New    Target Date 03/13/21               PT Long Term Goals - 02/27/21 1350       PT LONG TERM GOAL #1   Title Independent with progressed HEP to improve outcomes    Time 6    Period Weeks    Status New    Target Date 04/10/21      PT LONG TERM GOAL #2   Title Patient will demonstrate full pain free cervical AROM for safety with driving and ADLs    Baseline slow gaurded movements, tightness throughout, increased pain with extension, frequent headaches.    Time 6    Period Weeks    Status New    Target Date 04/10/21      PT LONG TERM GOAL #3   Title Pt. will report 75% improvement in frequency/intensity of neck and back pain.    Time 6    Period Weeks    Status New    Target Date 04/10/21      PT  LONG TERM GOAL #4   Title Pt. will improve FOTO to at least 50 (lumbar) to demonstrate improved functional mobility    Baseline 39    Time 6    Period Weeks    Status New    Target Date 04/10/21      PT LONG TERM GOAL #5   Title Pt. will be able to lift 25# without increased LBP.    Baseline avoids all lifting/pulling/pushing and overhead movements.    Time 6    Period Weeks    Status New    Target Date 04/10/21                    Plan - 02/27/21 1343     Clinical Impression Statement Carla Hines is a 55 year old female referred for chronic neck and back pain dating back to work incident/fall on 12/07/2019.  She has responded well to PT in the past, but continues to be well below her previous functional level and also pain has started to increase again, limiting her activities.  She demonstrates impairments today in activity tolerance (5x STS of 24 seconds with lightheadedness/nausea), pain in cervical spine and back, decreased cervical and lumbar ROM, and muscle weakness in hip flexors.  She would benefit from skilled physical therapy to improve core/hip/back strength, improve tolerance to  activities, decrease pain and muscle spasm in neck and back, and return to activities like walking for health.    Personal Factors and Comorbidities Comorbidity 3+;Time since onset of injury/illness/exacerbation;Finances    Comorbidities HIstory of hernia repair, migraines, orthostatic hypotension, chronic neck and back pain.    Examination-Activity Limitations Bend;Carry;Lift;Locomotion Level;Sit;Stand;Stairs;Reach Overhead    Examination-Participation Restrictions Driving;Yard Work;Community Activity;Occupation;Cleaning    Stability/Clinical Decision Making Evolving/Moderate complexity    Clinical Decision Making Moderate    Rehab Potential Good    PT Frequency 2x / week    PT Duration 6 weeks    PT Treatment/Interventions ADLs/Self Care Home Management;Cryotherapy;Electrical Stimulation;Aquatic Therapy;Ultrasound;Moist Heat;Traction;Stair training;Functional mobility training;Therapeutic activities;Therapeutic exercise;Neuromuscular re-education;Passive range of motion;Dry needling;Taping;Spinal Manipulations;Joint Manipulations    PT Next Visit Plan assess back/hip mobility, initiate core strengthening program, modalities PRN    Consulted and Agree with Plan of Care Patient             Patient will benefit from skilled therapeutic intervention in order to improve the following deficits and impairments:  Decreased activity tolerance, Decreased endurance, Decreased range of motion, Decreased strength, Increased fascial restricitons, Impaired perceived functional ability, Improper body mechanics, Pain, Decreased mobility, Difficulty walking, Increased muscle spasms, Postural dysfunction  Visit Diagnosis: Cervicalgia  Chronic midline low back pain with bilateral sciatica  Muscle weakness (generalized)  Other muscle spasm  Difficulty in walking, not elsewhere classified     Problem List There are no problems to display for this patient.   Jena Gauss, PT,  DPT 02/27/2021, 1:55 PM  [facility] [address]  [address] [place], [place], [address] Phone: [phone]   Fax:  [phone]  Name: Carla Hines MRN: [id number] Date of Birth: 1965-08-10

## 2021-03-05 ENCOUNTER — Ambulatory Visit: Payer: Medicaid Other | Attending: Physical Medicine and Rehabilitation

## 2021-03-05 ENCOUNTER — Other Ambulatory Visit: Payer: Self-pay

## 2021-03-05 DIAGNOSIS — M6281 Muscle weakness (generalized): Secondary | ICD-10-CM | POA: Diagnosis present

## 2021-03-05 DIAGNOSIS — M5442 Lumbago with sciatica, left side: Secondary | ICD-10-CM | POA: Diagnosis present

## 2021-03-05 DIAGNOSIS — G8929 Other chronic pain: Secondary | ICD-10-CM

## 2021-03-05 DIAGNOSIS — M62838 Other muscle spasm: Secondary | ICD-10-CM | POA: Diagnosis present

## 2021-03-05 DIAGNOSIS — M5441 Lumbago with sciatica, right side: Secondary | ICD-10-CM | POA: Diagnosis present

## 2021-03-05 DIAGNOSIS — M542 Cervicalgia: Secondary | ICD-10-CM | POA: Diagnosis present

## 2021-03-05 DIAGNOSIS — R262 Difficulty in walking, not elsewhere classified: Secondary | ICD-10-CM | POA: Diagnosis present

## 2021-03-05 NOTE — Therapy (Signed)
[facility] Outpatient Rehabilitation [facility] [address]  [address] [place], [place], [address] Phone: [phone]   Fax:  [phone]  Physical Therapy Treatment  Patient Details  Name: Carla Hines MRN: [id number] Date of Birth: 17-Apr-1966 Referring Provider (PT): Claria Dice, MD   Encounter Date: 03/05/2021   PT End of Session - 03/05/21 1358     Visit Number 2    Number of Visits 12    Date for PT Re-Evaluation 04/10/21    Authorization Type [employer]    Progress Note Due on Visit 10    PT Start Time 1315    PT Stop Time 1356    PT Time Calculation (min) 41 min    Activity Tolerance Patient tolerated treatment well;Patient limited by pain    Behavior During Therapy [facility] for tasks assessed/performed             Past Medical History:  Diagnosis Date   Acid reflux    Migraines     Past Surgical History:  Procedure Laterality Date   CERVIX SURGERY     HERNIA REPAIR      There were no vitals filed for this visit.   Subjective Assessment - 03/05/21 1317     Subjective Pt reports that she has been constantly in pain, made a bit worse today from sitting and driving.    Pertinent History chronic neck and back pain, orthostatic hypotension    Diagnostic tests X-rays- unavailable for review.  Reports "leaking" disc in back and bulging disc in neck    Patient Stated Goals being able to function more and longer period of time    Currently in Pain? Yes    Pain Score 6     Pain Location Shoulder    Pain Orientation Right    Pain Descriptors / Indicators Dull;Aching    Pain Type Acute pain;Chronic pain    Pain Score 8    Pain Location Back    Pain Orientation Mid;Lower    Pain Descriptors / Indicators Aching    Pain Type Acute pain;Chronic pain                [facility] PT Assessment - 03/05/21 0001       AROM   AROM Assessment Site Lumbar    Lumbar Flexion hands to toes    Lumbar Extension 50% limited    Lumbar - Right Side  Bend hands to knee joint line    Lumbar - Left Side Bend hands to knee joint line    Lumbar - Right Rotation WFL    Lumbar - Left Rotation [facility]      Flexibility   Soft Tissue Assessment /Muscle Length yes    Hamstrings mild tightness    Piriformis midly tight on R; mod tight on L                           [facility] Adult PT Treatment/Exercise - 03/05/21 0001       Exercises   Exercises Lumbar;Neck      Neck Exercises: Machines for Strengthening   UBE (Upper Arm Bike) L1.0 each way      Neck Exercises: Seated   Other Seated Exercise shoulder squeezes 10x3"      Lumbar Exercises: Stretches   Pelvic Tilt 10 reps    Pelvic Tilt Limitations 3 second hold in sitting    Piriformis Stretch Right;Left;3 reps;20 seconds    Piriformis  Stretch Limitations seated KTOS    Figure 4 Stretch 3 reps;20 seconds;Seated;With overpressure    Figure 4 Stretch Limitations R/L    Other Lumbar Stretch Exercise lumbar flexion stretch with      Manual Therapy   Manual Therapy Soft tissue mobilization    Soft tissue mobilization STM to B thoracolumbar paraspinals and traps                     PT Education - 03/05/21 1405     Education Details HEP update: Access Code: [email or web address]    Person(s) Educated Patient    Methods Explanation;Demonstration;Handout    Comprehension Verbalized understanding;Returned demonstration              PT Short Term Goals - 03/05/21 1359       PT SHORT TERM GOAL #1   Title Independent with initial HEP    Time 2    Period Weeks    Status On-going    Target Date 03/13/21               PT Long Term Goals - 03/05/21 1359       PT LONG TERM GOAL #1   Title Independent with progressed HEP to improve outcomes    Time 6    Period Weeks    Status On-going      PT LONG TERM GOAL #2   Title Patient will demonstrate full pain free cervical AROM for safety with driving and ADLs    Baseline slow gaurded movements, tightness  throughout, increased pain with extension, frequent headaches.    Time 6    Period Weeks    Status On-going      PT LONG TERM GOAL #3   Title Pt. will report 75% improvement in frequency/intensity of neck and back pain.    Time 6    Period Weeks    Status On-going      PT LONG TERM GOAL #4   Title Pt. will improve FOTO to at least 50 (lumbar) to demonstrate improved functional mobility    Baseline 39    Time 6    Period Weeks    Status On-going      PT LONG TERM GOAL #5   Title Pt. will be able to lift 25# without increased LBP.    Baseline avoids all lifting/pulling/pushing and overhead movements.    Time 6    Period Weeks    Status On-going                   Plan - 03/05/21 1405     Clinical Impression Statement Pt came in with reports of LBP and R sided neck pain. She demonstrated global tightness with lumbar ROM but B rotation WFL. L piriformis is more tight than the R side, hamstrings only mildly tight. Cues required with the stretches to avoid pushing into pain. She only had mild c/o pain with the interventions but modifications made to decrease painful ROM allowed pain to subside. Finished session with STM to B thoracolumbar parapsinals and traps, much improvement in pain noted by pt post session.    Personal Factors and Comorbidities Comorbidity 3+;Time since onset of injury/illness/exacerbation;Finances    Comorbidities HIstory of hernia repair, migraines, orthostatic hypotension, chronic neck and back pain.    PT Frequency 2x / week    PT Duration 6 weeks    PT Treatment/Interventions ADLs/Self Care Home Management;Cryotherapy;Electrical Stimulation;Aquatic Therapy;Ultrasound;Moist Heat;Traction;Stair training;Functional mobility training;Therapeutic activities;Therapeutic exercise;Neuromuscular re-education;Passive range  of motion;Dry needling;Taping;Spinal Manipulations;Joint Manipulations    PT Next Visit Plan initiate core strengthening program, modalities  PRN    Consulted and Agree with Plan of Care Patient             Patient will benefit from skilled therapeutic intervention in order to improve the following deficits and impairments:  Decreased activity tolerance, Decreased endurance, Decreased range of motion, Decreased strength, Increased fascial restricitons, Impaired perceived functional ability, Improper body mechanics, Pain, Decreased mobility, Difficulty walking, Increased muscle spasms, Postural dysfunction  Visit Diagnosis: Cervicalgia  Chronic midline low back pain with bilateral sciatica  Muscle weakness (generalized)  Other muscle spasm  Difficulty in walking, not elsewhere classified     Problem List There are no problems to display for this patient.   Darleene Cleaver, PTA 03/05/2021, 2:35 PM  [facility] [address]  [address] [place], [place], [address] Phone: [phone]   Fax:  [phone]  Name: Carla Hines MRN: [id number] Date of Birth: 1965/09/24

## 2021-03-08 ENCOUNTER — Other Ambulatory Visit: Payer: Self-pay

## 2021-03-08 ENCOUNTER — Encounter: Payer: Self-pay | Admitting: Physical Therapy

## 2021-03-08 ENCOUNTER — Ambulatory Visit: Payer: Medicaid Other | Admitting: Physical Therapy

## 2021-03-08 DIAGNOSIS — G8929 Other chronic pain: Secondary | ICD-10-CM

## 2021-03-08 DIAGNOSIS — M542 Cervicalgia: Secondary | ICD-10-CM | POA: Diagnosis not present

## 2021-03-08 DIAGNOSIS — M5442 Lumbago with sciatica, left side: Secondary | ICD-10-CM

## 2021-03-08 DIAGNOSIS — M62838 Other muscle spasm: Secondary | ICD-10-CM

## 2021-03-08 DIAGNOSIS — R262 Difficulty in walking, not elsewhere classified: Secondary | ICD-10-CM

## 2021-03-08 DIAGNOSIS — M6281 Muscle weakness (generalized): Secondary | ICD-10-CM

## 2021-03-08 NOTE — Patient Instructions (Signed)
Access Code: [email or web address] URL: [email or web address] Date: 03/08/2021 Prepared by: Harrie Foreman  Exercises  Supine Lower Trunk Rotation - 1 x daily - 7 x weekly - 2 sets - 10 reps Supine Double Knee to Chest Modified - 1 x daily - 7 x weekly - 2 sets - 10 reps - 15 sec hold Supine Posterior Pelvic Tilt - 1 x daily - 7 x weekly - 2 sets - 10 reps Supine Hip Adduction Isometric with Ball - 1 x daily - 7 x weekly - 2 sets - 10 reps - 5 sec hold Supine Transversus Abdominis Bracing with Double Leg Fallout - 1 x daily - 7 x weekly - 2 sets - 10 reps Supine Chin Tuck - 1 x daily - 7 x weekly - 2 sets - 10 reps - 5 sec hold Supine Suboccipital Release with Tennis Balls - 1 x daily - 7 x weekly - 1 sets - 1 reps - 1 min hold

## 2021-03-08 NOTE — Therapy (Signed)
[facility] [address]  [address] [place], [place], [address] Phone: [phone]   Fax:  [phone]  Physical Therapy Treatment  Patient Details  Name: Carla Hines MRN: [id number] Date of Birth: 1965/08/18 Referring Provider (PT): Claria Dice, MD   Encounter Date: 03/08/2021   PT End of Session - 03/08/21 1105     Visit Number 3    Number of Visits 12    Date for PT Re-Evaluation 04/10/21    Authorization Type [employer]    Progress Note Due on Visit 10    PT Start Time 1102    PT Stop Time 1157    PT Time Calculation (min) 55 min    Activity Tolerance Patient tolerated treatment well;Patient limited by pain    Behavior During Therapy [facility] for tasks assessed/performed             Past Medical History:  Diagnosis Date   Acid reflux    Migraines     Past Surgical History:  Procedure Laterality Date   CERVIX SURGERY     HERNIA REPAIR      There were no vitals filed for this visit.   Subjective Assessment - 03/08/21 1103     Subjective Patient reports "pregaban" medication she is taking is making her nauseous all the time.  She is going to call and talk to doctor about it.  She reports back/neck pain is about the same.    Pertinent History chronic neck and back pain, orthostatic hypotension    Diagnostic tests X-rays- unavailable for review.  Reports "leaking" disc in back and bulging disc in neck    Patient Stated Goals being able to function more and longer period of time    Currently in Pain? Yes    Pain Score 7     Pain Location Back    Pain Score 4    Pain Location Neck    Pain Descriptors / Indicators Headache                               [facility] Adult PT Treatment/Exercise - 03/08/21 0001       Exercises   Exercises Lumbar;Neck      Neck Exercises: Machines for Strengthening   Nustep L4 x 6 min      Neck Exercises: Supine   Neck Retraction 10 reps    Neck  Retraction Limitations into therapists hands, gentle 3 sec hold      Lumbar Exercises: Stretches   Single Knee to Chest Stretch Right;Left;3 reps;10 seconds    Double Knee to Chest Stretch 3 reps;10 seconds    Double Knee to Chest Stretch Limitations preferred over [facility]    Lower Trunk Rotation Limitations    Lower Trunk Rotation Limitations x 10 bil no hold pain free ROM    Pelvic Tilt 10 reps      Lumbar Exercises: Supine   Pelvic Tilt 10 reps;5 seconds    Pelvic Tilt Limitations with ball squeeze    Clam 10 reps    Clam Limitations with abdominal bracing, YTB x 10      Modalities   Modalities Moist Heat      Moist Heat Therapy   Number Minutes Moist Heat 10 Minutes    Moist Heat Location Cervical      Manual Therapy   Manual Therapy Soft tissue mobilization;Joint mobilization;Myofascial release    Manual therapy comments  in supine to decrease muscle spasm and pain and improve cervical ROM    Joint Mobilization PA mobs grade 1-2 cervical spine - hypomobile throughout, NAGS into rotation    Soft tissue mobilization STM to cervical paraspinals and suboccipitals    Myofascial Release suboccipital release, gentle cervical traction with short holds.                     PT Education - 03/08/21 1201     Education Details Access Code: [email or web address]    Person(s) Educated Patient    Methods Explanation;Demonstration;Handout;Verbal cues    Comprehension Verbalized understanding;Returned demonstration              PT Short Term Goals - 03/08/21 1106       PT SHORT TERM GOAL #1   Title Independent with initial HEP    Time 2    Period Weeks    Status Achieved    Target Date 03/13/21               PT Long Term Goals - 03/05/21 1359       PT LONG TERM GOAL #1   Title Independent with progressed HEP to improve outcomes    Time 6    Period Weeks    Status On-going      PT LONG TERM GOAL #2   Title Patient will demonstrate full pain free cervical AROM for  safety with driving and ADLs    Baseline slow gaurded movements, tightness throughout, increased pain with extension, frequent headaches.    Time 6    Period Weeks    Status On-going      PT LONG TERM GOAL #3   Title Pt. will report 75% improvement in frequency/intensity of neck and back pain.    Time 6    Period Weeks    Status On-going      PT LONG TERM GOAL #4   Title Pt. will improve FOTO to at least 50 (lumbar) to demonstrate improved functional mobility    Baseline 39    Time 6    Period Weeks    Status On-going      PT LONG TERM GOAL #5   Title Pt. will be able to lift 25# without increased LBP.    Baseline avoids all lifting/pulling/pushing and overhead movements.    Time 6    Period Weeks    Status On-going                   Plan - 03/08/21 1202     Clinical Impression Statement Patient reported continued back pain and neck pain, along with headache today.  Educated on pain science and goal to retrain body to not associate pain with movement, so all exercises should be gentle and pain pain free ROM.  Inititiated neutral spine exercises today, which she tolerated well althoug needed cues to decrease isometric contraction with ball squeeze to avoid pain provacation.  Also given supine chin tucks.  Manual therapy to neck followed by MHP to cervical spine, after which patient reported decreased headache and overall pain.  Patient would benefit from continued skilled therapy.    Personal Factors and Comorbidities Comorbidity 3+;Time since onset of injury/illness/exacerbation;Finances    Comorbidities HIstory of hernia repair, migraines, orthostatic hypotension, chronic neck and back pain.    PT Frequency 2x / week    PT Duration 6 weeks    PT Treatment/Interventions ADLs/Self Care Home Management;Cryotherapy;Electrical Stimulation;Aquatic Therapy;Ultrasound;Moist Heat;Traction;Stair training;Functional mobility  training;Therapeutic activities;Therapeutic  exercise;Neuromuscular re-education;Passive range of motion;Dry needling;Taping;Spinal Manipulations;Joint Manipulations    PT Next Visit Plan initiate core strengthening program, modalities PRN    Consulted and Agree with Plan of Care Patient             Patient will benefit from skilled therapeutic intervention in order to improve the following deficits and impairments:  Decreased activity tolerance, Decreased endurance, Decreased range of motion, Decreased strength, Increased fascial restricitons, Impaired perceived functional ability, Improper body mechanics, Pain, Decreased mobility, Difficulty walking, Increased muscle spasms, Postural dysfunction  Visit Diagnosis: Cervicalgia  Chronic midline low back pain with bilateral sciatica  Muscle weakness (generalized)  Other muscle spasm  Difficulty in walking, not elsewhere classified     Problem List There are no problems to display for this patient.   Jena Gauss, PT, DPT  03/08/2021, 12:10 PM  [facility] [address]  [address] [place], [place], [address] Phone: [phone]   Fax:  [phone]  Name: Carla Hines MRN: [id number] Date of Birth: Jul 29, 1965

## 2021-03-13 ENCOUNTER — Ambulatory Visit: Payer: Medicaid Other

## 2021-03-13 ENCOUNTER — Other Ambulatory Visit: Payer: Self-pay

## 2021-03-13 DIAGNOSIS — M542 Cervicalgia: Secondary | ICD-10-CM

## 2021-03-13 DIAGNOSIS — M6281 Muscle weakness (generalized): Secondary | ICD-10-CM

## 2021-03-13 DIAGNOSIS — R262 Difficulty in walking, not elsewhere classified: Secondary | ICD-10-CM

## 2021-03-13 DIAGNOSIS — M62838 Other muscle spasm: Secondary | ICD-10-CM

## 2021-03-13 DIAGNOSIS — G8929 Other chronic pain: Secondary | ICD-10-CM

## 2021-03-13 NOTE — Therapy (Signed)
[facility] [address]  [address] [place], [place], [address] Phone: [phone]   Fax:  [phone]  Physical Therapy Treatment  Patient Details  Name: Carla Hines MRN: [id number] Date of Birth: 1966-01-17 Referring Provider (PT): Claria Dice, MD   Encounter Date: 03/13/2021   PT End of Session - 03/13/21 1013     Visit Number 4    Number of Visits 12    Date for PT Re-Evaluation 04/10/21    Authorization Type [employer]    Progress Note Due on Visit 10    PT Start Time 0933    PT Stop Time 1022    PT Time Calculation (min) 49 min    Activity Tolerance Patient tolerated treatment well;Patient limited by pain    Behavior During Therapy [facility] for tasks assessed/performed             Past Medical History:  Diagnosis Date   Acid reflux    Migraines     Past Surgical History:  Procedure Laterality Date   CERVIX SURGERY     HERNIA REPAIR      There were no vitals filed for this visit.   Subjective Assessment - 03/13/21 0934     Subjective Pt had groin pains and pain down the front and back of her legs. Has an MD appointment today to address these issues.    Pertinent History chronic neck and back pain, orthostatic hypotension    Diagnostic tests X-rays- unavailable for review.  Reports "leaking" disc in back and bulging disc in neck    Patient Stated Goals being able to function more and longer period of time    Currently in Pain? Yes    Pain Score 3     Pain Location Hip    Pain Orientation Right    Pain Descriptors / Indicators Aching    Pain Type Acute pain;Chronic pain                               [facility] Adult PT Treatment/Exercise - 03/13/21 0001       Exercises   Exercises Lumbar;Neck      Neck Exercises: Machines for Strengthening   UBE (Upper Arm Bike) L1.5 each way      Lumbar Exercises: Stretches   Lower Trunk Rotation Limitations 10x3"; relief noted  when going to the L side    Pelvic Tilt 10 reps    Pelvic Tilt Limitations PPT with 5" hold    Figure 4 Stretch 2 reps;30 seconds;Supine;With overpressure    Figure 4 Stretch Limitations R/L      Lumbar Exercises: Supine   Clam Limitations 3x10"    Bridge 20 reps;2 seconds    Straight Leg Raise 5 reps;2 seconds      Modalities   Modalities Moist Heat      Moist Heat Therapy   Number Minutes Moist Heat 10 Minutes    Moist Heat Location Lumbar Spine;Cervical      Manual Therapy   Manual Therapy Soft tissue mobilization    Soft tissue mobilization STM to R thoracolumbar paraspinals, traps, LS, QL                       PT Short Term Goals - 03/08/21 1106       PT SHORT TERM GOAL #1   Title Independent with initial HEP    Time  2    Period Weeks    Status Achieved    Target Date 03/13/21               PT Long Term Goals - 03/05/21 1359       PT LONG TERM GOAL #1   Title Independent with progressed HEP to improve outcomes    Time 6    Period Weeks    Status On-going      PT LONG TERM GOAL #2   Title Patient will demonstrate full pain free cervical AROM for safety with driving and ADLs    Baseline slow gaurded movements, tightness throughout, increased pain with extension, frequent headaches.    Time 6    Period Weeks    Status On-going      PT LONG TERM GOAL #3   Title Pt. will report 75% improvement in frequency/intensity of neck and back pain.    Time 6    Period Weeks    Status On-going      PT LONG TERM GOAL #4   Title Pt. will improve FOTO to at least 50 (lumbar) to demonstrate improved functional mobility    Baseline 39    Time 6    Period Weeks    Status On-going      PT LONG TERM GOAL #5   Title Pt. will be able to lift 25# without increased LBP.    Baseline avoids all lifting/pulling/pushing and overhead movements.    Time 6    Period Weeks    Status On-going                   Plan - 03/13/21 1015     Clinical  Impression Statement Pt had some complaints of groin pain while trying to do SLRs, pain subsided with clamshell stretch but still was limited by B LE weakness. Noted relief with the holds during LTRs and the supine figure 4 stretch. She reported that the interventions improved her hip pain. Finished with some STM and moist heat, to decrease muscle tension and pain. She may benefit from some DN.    Personal Factors and Comorbidities Comorbidity 3+;Time since onset of injury/illness/exacerbation;Finances    Comorbidities HIstory of hernia repair, migraines, orthostatic hypotension, chronic neck and back pain.    PT Frequency 2x / week    PT Duration 6 weeks    PT Treatment/Interventions ADLs/Self Care Home Management;Cryotherapy;Electrical Stimulation;Aquatic Therapy;Ultrasound;Moist Heat;Traction;Stair training;Functional mobility training;Therapeutic activities;Therapeutic exercise;Neuromuscular re-education;Passive range of motion;Dry needling;Taping;Spinal Manipulations;Joint Manipulations    PT Next Visit Plan try DN? initiate core strengthening program, modalities PRN    Consulted and Agree with Plan of Care Patient             Patient will benefit from skilled therapeutic intervention in order to improve the following deficits and impairments:  Decreased activity tolerance, Decreased endurance, Decreased range of motion, Decreased strength, Increased fascial restricitons, Impaired perceived functional ability, Improper body mechanics, Pain, Decreased mobility, Difficulty walking, Increased muscle spasms, Postural dysfunction  Visit Diagnosis: Cervicalgia  Chronic midline low back pain with bilateral sciatica  Muscle weakness (generalized)  Other muscle spasm  Difficulty in walking, not elsewhere classified     Problem List There are no problems to display for this patient.   Darleene Cleaver, PTA 03/13/2021, 11:48 AM  [facility] [address]  [address] [place], [place], [address] Phone: [phone]   Fax:  [phone]  Name: Carla Hines MRN:  [id number] Date of Birth: [birth year]

## 2021-03-15 ENCOUNTER — Other Ambulatory Visit: Payer: Self-pay

## 2021-03-15 ENCOUNTER — Ambulatory Visit: Payer: Medicaid Other

## 2021-03-15 DIAGNOSIS — R262 Difficulty in walking, not elsewhere classified: Secondary | ICD-10-CM

## 2021-03-15 DIAGNOSIS — M6281 Muscle weakness (generalized): Secondary | ICD-10-CM

## 2021-03-15 DIAGNOSIS — M62838 Other muscle spasm: Secondary | ICD-10-CM

## 2021-03-15 DIAGNOSIS — G8929 Other chronic pain: Secondary | ICD-10-CM

## 2021-03-15 DIAGNOSIS — M542 Cervicalgia: Secondary | ICD-10-CM

## 2021-03-15 NOTE — Patient Instructions (Signed)

## 2021-03-15 NOTE — Therapy (Addendum)
[facility] [address]  [address] [place], [place], [address] Phone: [phone]   Fax:  [phone]  Physical Therapy Treatment  Patient Details  Name: Carla Hines MRN: [id number] Date of Birth: [birth year] Referring Provider (PT): Claria Dice, MD   Encounter Date: 03/15/2021   PT End of Session - 03/15/21 1155     Visit Number 5    Number of Visits 12    Date for PT Re-Evaluation 04/10/21    Authorization Type [employer]    Progress Note Due on Visit 10    PT Start Time 1100    PT Stop Time 1147    PT Time Calculation (min) 47 min    Activity Tolerance Patient tolerated treatment well    Behavior During Therapy [facility] for tasks assessed/performed             Past Medical History:  Diagnosis Date   Acid reflux    Migraines     Past Surgical History:  Procedure Laterality Date   CERVIX SURGERY     HERNIA REPAIR      There were no vitals filed for this visit.   Subjective Assessment - 03/15/21 1101     Subjective Pt reports that she went to the doctor the other day, they want to do an MRI for the spine. Also recieved an injection for her hip    Pertinent History chronic neck and back pain, orthostatic hypotension    Diagnostic tests X-rays- unavailable for review.  Reports "leaking" disc in back and bulging disc in neck    Patient Stated Goals being able to function more and longer period of time    Currently in Pain? Yes    Pain Score 7     Pain Location Hip    Pain Orientation Right    Pain Descriptors / Indicators Aching    Pain Type Acute pain;Chronic pain                               [facility] Adult PT Treatment/Exercise - 03/15/21 0001       Self-Care   Self-Care Other Self-Care Comments    Other Self-Care Comments  edu on husband helping to do inf hip glides at home for relief; also edu on her hyperextended posture with excessive APT being a contributing factor in  LBP      Lumbar Exercises: Aerobic   Nustep L4x28min      Manual Therapy   Manual Therapy Soft tissue mobilization;Joint mobilization    Joint Mobilization inferior hip glides with prolonged holds and oscillations    Soft tissue mobilization STM to B thoracolumbar paraspinals, glutes              Trigger Point Dry Needling - 03/15/21 0001     Consent Given? Yes    Education Handout Provided Yes    Muscles Treated Head and Neck Upper trapezius   right   Muscles Treated Back/Hip Gluteus medius;Lumbar multifidi   R glut, bil L4& 5 multifidi   Dry Needling Comments in prone, to decrease muscle spasm and pain    Upper Trapezius Response Twitch reponse elicited;Palpable increased muscle length    Gluteus Medius Response Twitch response elicited;Palpable increased muscle length    Lumbar multifidi Response Twitch response elicited;Palpable increased muscle length                   PT  Education - 03/15/21 1207     Education Details education on dry needling, including risks and aftercare.    Person(s) Educated Patient    Methods Explanation;Demonstration;Handout    Comprehension Verbalized understanding              PT Short Term Goals - 03/08/21 1106       PT SHORT TERM GOAL #1   Title Independent with initial HEP    Time 2    Period Weeks    Status Achieved    Target Date 03/13/21               PT Long Term Goals - 03/05/21 1359       PT LONG TERM GOAL #1   Title Independent with progressed HEP to improve outcomes    Time 6    Period Weeks    Status On-going      PT LONG TERM GOAL #2   Title Patient will demonstrate full pain free cervical AROM for safety with driving and ADLs    Baseline slow gaurded movements, tightness throughout, increased pain with extension, frequent headaches.    Time 6    Period Weeks    Status On-going      PT LONG TERM GOAL #3   Title Pt. will report 75% improvement in frequency/intensity of neck and back pain.     Time 6    Period Weeks    Status On-going      PT LONG TERM GOAL #4   Title Pt. will improve FOTO to at least 50 (lumbar) to demonstrate improved functional mobility    Baseline 39    Time 6    Period Weeks    Status On-going      PT LONG TERM GOAL #5   Title Pt. will be able to lift 25# without increased LBP.    Baseline avoids all lifting/pulling/pushing and overhead movements.    Time 6    Period Weeks    Status On-going                   Plan - 03/15/21 1156     Clinical Impression Statement Pt noted 7/10 pain in B hips starting the session today. We started with some hip mobilizations for lumbar distraction, she noted a lot of relief from this, so I educated her on how to get her husband to do this at home. Also educated her on the alignment of her lumbar spine in hyperextension contributing to her LBP. Finished with some STM to the thoracolumbar parapsinals and glutes to decrease pain and muscle tension. Supervising PT brought in end of session for DN to further decrease pain and muscle tension.    Discussed potential trial of DN to help reduce muscle tension and guarding which is limiting her ROM progression and contributing to her pain. After explanation of DN rational, procedures, outcomes and potential side effects, patient verbalized consent to DN treatment in conjunction with manual STM/DTM and TPR to reduce ttp/muscle tension to lumbar multifidi, R glut med and R UT.  She reported decreased pain and tightness following intervention.   Jena Gauss, PT    Personal Factors and Comorbidities Comorbidity 3+;Time since onset of injury/illness/exacerbation;Finances    PT Frequency 2x / week    PT Duration 6 weeks    PT Treatment/Interventions ADLs/Self Care Home Management;Cryotherapy;Electrical Stimulation;Aquatic Therapy;Ultrasound;Moist Heat;Traction;Stair training;Functional mobility training;Therapeutic activities;Therapeutic exercise;Neuromuscular  re-education;Passive range of motion;Dry needling;Taping;Spinal Manipulations;Joint Manipulations    PT Next  Visit Plan assess response to DN; initiate core strengthening program, modalities PRN    Consulted and Agree with Plan of Care Patient             Patient will benefit from skilled therapeutic intervention in order to improve the following deficits and impairments:  Decreased activity tolerance, Decreased endurance, Decreased range of motion, Decreased strength, Increased fascial restricitons, Impaired perceived functional ability, Improper body mechanics, Pain, Decreased mobility, Difficulty walking, Increased muscle spasms, Postural dysfunction  Visit Diagnosis: Cervicalgia  Chronic midline low back pain with bilateral sciatica  Muscle weakness (generalized)  Other muscle spasm  Difficulty in walking, not elsewhere classified     Problem List There are no problems to display for this patient.   Darleene Cleaver, PTA 03/15/2021, 12:04 PM  Jena Gauss, PT, DPT 03/15/2021 12:10 PM   [facility] [address]  [address] [place], [place], [address] Phone: [phone]   Fax:  [phone]  Name: Carla Hines MRN: [id number] Date of Birth: 1965/09/04

## 2021-03-20 ENCOUNTER — Ambulatory Visit: Payer: Medicaid Other

## 2021-03-20 ENCOUNTER — Other Ambulatory Visit: Payer: Self-pay

## 2021-03-20 DIAGNOSIS — M62838 Other muscle spasm: Secondary | ICD-10-CM

## 2021-03-20 DIAGNOSIS — M6281 Muscle weakness (generalized): Secondary | ICD-10-CM

## 2021-03-20 DIAGNOSIS — M542 Cervicalgia: Secondary | ICD-10-CM

## 2021-03-20 DIAGNOSIS — R262 Difficulty in walking, not elsewhere classified: Secondary | ICD-10-CM

## 2021-03-20 DIAGNOSIS — G8929 Other chronic pain: Secondary | ICD-10-CM

## 2021-03-20 NOTE — Therapy (Signed)
[facility] Outpatient Rehabilitation [facility] [address]  [address] [place], [place], [address] Phone: [phone]   Fax:  [phone]  Physical Therapy Treatment  Patient Details  Name: Carla Hines MRN: [id number] Date of Birth: Feb 03, 1966 Referring Provider (PT): Claria Dice, MD   Encounter Date: 03/20/2021   PT End of Session - 03/20/21 1021     Visit Number 6    Number of Visits 12    Date for PT Re-Evaluation 04/10/21    Authorization Type [employer]    Progress Note Due on Visit 10    PT Start Time 0929    PT Stop Time 1015    PT Time Calculation (min) 46 min    Activity Tolerance Patient tolerated treatment well;Patient limited by pain    Behavior During Therapy [facility] for tasks assessed/performed             Past Medical History:  Diagnosis Date   Acid reflux    Migraines     Past Surgical History:  Procedure Laterality Date   CERVIX SURGERY     HERNIA REPAIR      There were no vitals filed for this visit.   Subjective Assessment - 03/20/21 0929     Subjective For the whole weekend she felt great, she did have some pain in her temple and some L inner thigh soreness still today.    Pertinent History chronic neck and back pain, orthostatic hypotension    Diagnostic tests X-rays- unavailable for review.  Reports "leaking" disc in back and bulging disc in neck    Patient Stated Goals being able to function more and longer period of time    Currently in Pain? Yes    Pain Score 5     Pain Location Back    Pain Orientation Mid;Left    Pain Descriptors / Indicators Aching    Pain Type Chronic pain                               [facility] Adult PT Treatment/Exercise - 03/20/21 0001       Lumbar Exercises: Stretches   Figure 4 Stretch 2 reps;30 seconds;With overpressure;Seated    Figure 4 Stretch Limitations R/L    Other Lumbar Stretch Exercise lumbar flexion stretch with green Pball 10x5"      Lumbar  Exercises: Aerobic   Recumbent Bike L1x16min   discontinued due to increased pain   Nustep L4x31min      Lumbar Exercises: Seated   Other Seated Lumbar Exercises circular WS with arms supported on green Pball 20x   some reports of L hip pain     Lumbar Exercises: Supine   Bent Knee Raise 10 reps;2 seconds    Bent Knee Raise Limitations yellow TB and TrA brace    Bridge with Ball Squeeze 10 reps;2 seconds    Bridge with clamshell 10 reps;3 seconds    Bridge with [employer] Limitations yellow TB      Lumbar Exercises: Sidelying   Clam Right;Left;10 reps    Clam Limitations yellow TB; tried holds but pt reported pain      Manual Therapy   Manual Therapy Joint mobilization    Joint Mobilization inferior hip glides with prolonged holds and oscillations                     PT Education - 03/20/21 0955     Education  Details progression to HEP    Person(s) Educated Patient    Methods Demonstration;Explanation;Handout    Comprehension Verbalized understanding;Returned demonstration              PT Short Term Goals - 03/08/21 1106       PT SHORT TERM GOAL #1   Title Independent with initial HEP    Time 2    Period Weeks    Status Achieved    Target Date 03/13/21               PT Long Term Goals - 03/20/21 1002       PT LONG TERM GOAL #1   Title Independent with progressed HEP to improve outcomes    Time 6    Period Weeks    Status On-going      PT LONG TERM GOAL #2   Title Patient will demonstrate full pain free cervical AROM for safety with driving and ADLs    Baseline slow gaurded movements, tightness throughout, increased pain with extension, frequent headaches.    Time 6    Period Weeks    Status On-going      PT LONG TERM GOAL #3   Title Pt. will report 75% improvement in frequency/intensity of neck and back pain.    Time 6    Period Weeks    Status On-going   50% improvement in neck pain; 20% improvement in back pain     PT LONG TERM  GOAL #4   Title Pt. will improve FOTO to at least 50 (lumbar) to demonstrate improved functional mobility    Baseline 39    Time 6    Period Weeks    Status On-going      PT LONG TERM GOAL #5   Title Pt. will be able to lift 25# without increased LBP.    Baseline avoids all lifting/pulling/pushing and overhead movements.    Time 6    Period Weeks    Status On-going                   Plan - 03/20/21 1006     Clinical Impression Statement Pt had mild pain with most exercises involving hip ER and lumbar rotation. Bridges were well tolerated during todays session. L pirirformis was shown to be more tight than the R in seated position. Continues to note relief from the hip distractions and has She had a good response to DN and wants to try it in future sessions due to decreases in pain levels. Overall she notes 50% improvement in her neck and 20% improvement in her back pain.    Personal Factors and Comorbidities Comorbidity 3+;Time since onset of injury/illness/exacerbation;Finances    Comorbidities HIstory of hernia repair, migraines, orthostatic hypotension, chronic neck and back pain.    PT Frequency 2x / week    PT Duration 6 weeks    PT Treatment/Interventions ADLs/Self Care Home Management;Cryotherapy;Electrical Stimulation;Aquatic Therapy;Ultrasound;Moist Heat;Traction;Stair training;Functional mobility training;Therapeutic activities;Therapeutic exercise;Neuromuscular re-education;Passive range of motion;Dry needling;Taping;Spinal Manipulations;Joint Manipulations    PT Next Visit Plan DN; initiate core strengthening program, modalities PRN    Consulted and Agree with Plan of Care Patient             Patient will benefit from skilled therapeutic intervention in order to improve the following deficits and impairments:  Decreased activity tolerance, Decreased endurance, Decreased range of motion, Decreased strength, Increased fascial restricitons, Impaired perceived  functional ability, Improper body mechanics, Pain, Decreased mobility, Difficulty walking,  Increased muscle spasms, Postural dysfunction  Visit Diagnosis: Cervicalgia  Chronic midline low back pain with bilateral sciatica  Muscle weakness (generalized)  Other muscle spasm  Difficulty in walking, not elsewhere classified     Problem List There are no problems to display for this patient.   Darleene Cleaver, PTA 03/20/2021, 12:16 PM  [facility] [address]  [address] [place], [place], [address] Phone: [phone]   Fax:  [phone]  Name: Carla Hines MRN: [id number] Date of Birth: April 04, 1966

## 2021-03-22 ENCOUNTER — Ambulatory Visit: Payer: Medicaid Other | Admitting: Physical Therapy

## 2021-03-22 ENCOUNTER — Other Ambulatory Visit: Payer: Self-pay

## 2021-03-22 ENCOUNTER — Encounter: Payer: Self-pay | Admitting: Physical Therapy

## 2021-03-22 DIAGNOSIS — G8929 Other chronic pain: Secondary | ICD-10-CM

## 2021-03-22 DIAGNOSIS — M6281 Muscle weakness (generalized): Secondary | ICD-10-CM

## 2021-03-22 DIAGNOSIS — R262 Difficulty in walking, not elsewhere classified: Secondary | ICD-10-CM

## 2021-03-22 DIAGNOSIS — M542 Cervicalgia: Secondary | ICD-10-CM | POA: Diagnosis not present

## 2021-03-22 DIAGNOSIS — M62838 Other muscle spasm: Secondary | ICD-10-CM

## 2021-03-22 NOTE — Patient Instructions (Signed)
Access Code: [email or web address] URL: [email or web address] Date: 03/22/2021 Prepared by: Harrie Foreman  Exercises Cervical Extension AROM with Strap - 1 x daily - 7 x weekly - 3 sets - 10 reps Seated Assisted Cervical Rotation with Towel - 1 x daily - 7 x weekly - 3 sets - 10 reps Beginner Reverse Clamshell - 1 x daily - 7 x weekly - 3 sets - 10 reps Prone Alternating Arm and Leg Lifts - 1 x daily - 7 x weekly - 3 sets - 10 reps Child's Pose Stretch - 1 x daily - 7 x weekly - 3 sets - 10 reps

## 2021-03-22 NOTE — Therapy (Signed)
[facility] Outpatient Rehabilitation [facility] [address]  [address] [place], [place], [address] Phone: [phone]   Fax:  [phone]  Physical Therapy Treatment  Patient Details  Name: Carla Hines MRN: [id number] Date of Birth: [birth year] Referring Provider (PT): Claria Dice, MD   Encounter Date: 03/22/2021   PT End of Session - 03/22/21 1111     Visit Number 7    Number of Visits 12    Date for PT Re-Evaluation 04/10/21    Authorization Type [employer]    Progress Note Due on Visit 10    PT Start Time 1107    PT Stop Time 1150    PT Time Calculation (min) 43 min    Activity Tolerance Patient tolerated treatment well    Behavior During Therapy [facility] for tasks assessed/performed             Past Medical History:  Diagnosis Date   Acid reflux    Migraines     Past Surgical History:  Procedure Laterality Date   CERVIX SURGERY     HERNIA REPAIR      There were no vitals filed for this visit.   Subjective Assessment - 03/22/21 1110     Subjective Patient reports her neck feels tight today, but has pain in low back/hips.  She reports clamshell exercise tends to aggravate back "they had me do it at the other place too, and it always hurts."    Pertinent History chronic neck and back pain, orthostatic hypotension    Diagnostic tests X-rays- unavailable for review.  Reports "leaking" disc in back and bulging disc in neck    Patient Stated Goals being able to function more and longer period of time    Currently in Pain? Yes    Pain Score 4     Pain Location Back    Pain Orientation Lower                               [facility] Adult PT Treatment/Exercise - 03/22/21 0001       Self-Care   Self-Care Other Self-Care Comments    Other Self-Care Comments  pain neuroscience education      Exercises   Exercises Lumbar;Neck      Neck Exercises: Seated   Other Seated Exercise cervical snags into extension and  rotation using pillow case.      Lumbar Exercises: Aerobic   Nustep L5 x 6 min      Lumbar Exercises: Supine   Bridge 20 reps;2 seconds    Bridge Limitations second set slightly higher    Bridge with [employer] 10 reps;2 seconds    Basic Lumbar Stabilization Limitations table tops x 5    Straight Leg Raise 5 reps    Straight Leg Raises Limitations cues for TrA contraction      Lumbar Exercises: Sidelying   Clam Limitations Reverse clams x 15 bil - no increase in pain      Lumbar Exercises: Prone   Single Arm Raise Right;Left;10 reps    Single Arm Raises Limitations 2 sets, no pain    Other Prone Lumbar Exercises hamstring curls 2 x 10 bil    Other Prone Lumbar Exercises child pose stretch after prone exercises, tried adding lateral stretch as well, liked stretching one side but complained of pain on other side.  PT Education - 03/22/21 1156     Education Details progressed HEP, educated to perform all exercises in pain free ROM, or side of preference if one side painful.  Continued education on pain neuroscience during exercises.    Person(s) Educated Patient    Methods Explanation;Demonstration;Verbal cues;Handout    Comprehension Verbalized understanding;Returned demonstration              PT Short Term Goals - 03/08/21 1106       PT SHORT TERM GOAL #1   Title Independent with initial HEP    Time 2    Period Weeks    Status Achieved    Target Date 03/13/21               PT Long Term Goals - 03/20/21 1002       PT LONG TERM GOAL #1   Title Independent with progressed HEP to improve outcomes    Time 6    Period Weeks    Status On-going      PT LONG TERM GOAL #2   Title Patient will demonstrate full pain free cervical AROM for safety with driving and ADLs    Baseline slow gaurded movements, tightness throughout, increased pain with extension, frequent headaches.    Time 6    Period Weeks    Status On-going      PT  LONG TERM GOAL #3   Title Pt. will report 75% improvement in frequency/intensity of neck and back pain.    Time 6    Period Weeks    Status On-going   50% improvement in neck pain; 20% improvement in back pain     PT LONG TERM GOAL #4   Title Pt. will improve FOTO to at least 50 (lumbar) to demonstrate improved functional mobility    Baseline 39    Time 6    Period Weeks    Status On-going      PT LONG TERM GOAL #5   Title Pt. will be able to lift 25# without increased LBP.    Baseline avoids all lifting/pulling/pushing and overhead movements.    Time 6    Period Weeks    Status On-going                   Plan - 03/22/21 1157     Clinical Impression Statement Reviewed exercises with patients as reportes pain with clamshells - replaced clamshells with reverse clamshells which she tolerated much better, so discontinued clamshell exercise.  Worked through slow progression of exercises today for core strengthening, educating throughout session on pain neuroscience and purpose of exercises to decrease sensitivity of pain system again, so if one side is painful, perform only on side of preference, like with child pose and QL stretch to side.  She reported no pain at end of session.    Personal Factors and Comorbidities Comorbidity 3+;Time since onset of injury/illness/exacerbation;Finances    Comorbidities HIstory of hernia repair, migraines, orthostatic hypotension, chronic neck and back pain.    PT Frequency 2x / week    PT Duration 6 weeks    PT Treatment/Interventions ADLs/Self Care Home Management;Cryotherapy;Electrical Stimulation;Aquatic Therapy;Ultrasound;Moist Heat;Traction;Stair training;Functional mobility training;Therapeutic activities;Therapeutic exercise;Neuromuscular re-education;Passive range of motion;Dry needling;Taping;Spinal Manipulations;Joint Manipulations    PT Next Visit Plan DN; initiate core strengthening program, modalities PRN    Consulted and Agree with  Plan of Care Patient             Patient will benefit from skilled therapeutic  intervention in order to improve the following deficits and impairments:  Decreased activity tolerance, Decreased endurance, Decreased range of motion, Decreased strength, Increased fascial restricitons, Impaired perceived functional ability, Improper body mechanics, Pain, Decreased mobility, Difficulty walking, Increased muscle spasms, Postural dysfunction  Visit Diagnosis: Cervicalgia  Chronic midline low back pain with bilateral sciatica  Muscle weakness (generalized)  Other muscle spasm  Difficulty in walking, not elsewhere classified     Problem List There are no problems to display for this patient.   Jena Gauss, PT, DPT 03/22/2021, 12:03 PM  [facility] [address]  [address] [place], [place], [address] Phone: [phone]   Fax:  [phone]  Name: Carla Hines MRN: [id number] Date of Birth: 1965-08-27

## 2021-04-02 ENCOUNTER — Ambulatory Visit: Payer: Medicaid Other

## 2021-04-02 ENCOUNTER — Other Ambulatory Visit: Payer: Self-pay

## 2021-04-02 DIAGNOSIS — M6281 Muscle weakness (generalized): Secondary | ICD-10-CM

## 2021-04-02 DIAGNOSIS — R262 Difficulty in walking, not elsewhere classified: Secondary | ICD-10-CM

## 2021-04-02 DIAGNOSIS — M542 Cervicalgia: Secondary | ICD-10-CM

## 2021-04-02 DIAGNOSIS — G8929 Other chronic pain: Secondary | ICD-10-CM

## 2021-04-02 DIAGNOSIS — M62838 Other muscle spasm: Secondary | ICD-10-CM

## 2021-04-02 NOTE — Patient Instructions (Signed)
Access Code: [email or web address] URL: [email or web address] Date: 04/02/2021 Prepared by: Verta Ellen  Exercises Cervical Extension AROM with Strap - 1 x daily - 7 x weekly - 3 sets - 10 reps Seated Assisted Cervical Rotation with Towel - 1 x daily - 7 x weekly - 3 sets - 10 reps Beginner Reverse Clamshell - 1 x daily - 7 x weekly - 3 sets - 10 reps Prone Alternating Arm and Leg Lifts - 1 x daily - 7 x weekly - 3 sets - 10 reps Child's Pose Stretch - 1 x daily - 7 x weekly - 3 sets - 10 reps Seated Flexion Stretch with Swiss Ball - 2 x daily - 7 x weekly - 2 sets - 2 reps - 30 sec hold

## 2021-04-02 NOTE — Therapy (Signed)
[facility] Outpatient Rehabilitation [facility] [address]  [address] [place], [place], [address] Phone: [phone]   Fax:  [phone]  Physical Therapy Treatment  Patient Details  Name: Carla Hines MRN: [id number] Date of Birth: 05-27-1965 Referring Provider (PT): Claria Dice, MD   Encounter Date: 04/02/2021   PT End of Session - 04/02/21 1445     Visit Number 8    Number of Visits 12    Date for PT Re-Evaluation 04/10/21    Authorization Type [employer]    Progress Note Due on Visit 10    PT Start Time 1404    PT Stop Time 1459    PT Time Calculation (min) 55 min    Activity Tolerance Patient tolerated treatment well;Patient limited by pain    Behavior During Therapy [facility] for tasks assessed/performed             Past Medical History:  Diagnosis Date   Acid reflux    Migraines     Past Surgical History:  Procedure Laterality Date   CERVIX SURGERY     HERNIA REPAIR      There were no vitals filed for this visit.   Subjective Assessment - 04/02/21 1410     Subjective "Last week I was in so much pain I had to be helped to bed on Thursday, have been relying on pain meds to keep me from hurting."    Pertinent History chronic neck and back pain, orthostatic hypotension    Diagnostic tests X-rays- unavailable for review.  Reports "leaking" disc in back and bulging disc in neck    Patient Stated Goals being able to function more and longer period of time    Currently in Pain? Yes    Pain Score 7     Pain Location Back    Pain Orientation Lower    Pain Descriptors / Indicators Aching    Pain Type Chronic pain                               [facility] Adult PT Treatment/Exercise - 04/02/21 0001       Lumbar Exercises: Stretches   Other Lumbar Stretch Exercise lumbar flexion stretch 2x30 sec      Lumbar Exercises: Aerobic   Nustep L5 x 6 min      Moist Heat Therapy   Number Minutes Moist Heat 21 Minutes     Moist Heat Location Lumbar Spine;Cervical   6 min on LB while on nustep; 15 min post session to back and neck     Manual Therapy   Manual Therapy Soft tissue mobilization    Soft tissue mobilization to B thoracolumbar paraspinals                     PT Education - 04/02/21 1444     Education Details HEP update, edu on TENS unit for use at home    Person(s) Educated Patient    Methods Explanation;Demonstration;Verbal cues;Handout    Comprehension Verbalized understanding;Returned demonstration;Verbal cues required              PT Short Term Goals - 03/08/21 1106       PT SHORT TERM GOAL #1   Title Independent with initial HEP    Time 2    Period Weeks    Status Achieved    Target Date 03/13/21  PT Long Term Goals - 03/20/21 1002       PT LONG TERM GOAL #1   Title Independent with progressed HEP to improve outcomes    Time 6    Period Weeks    Status On-going      PT LONG TERM GOAL #2   Title Patient will demonstrate full pain free cervical AROM for safety with driving and ADLs    Baseline slow gaurded movements, tightness throughout, increased pain with extension, frequent headaches.    Time 6    Period Weeks    Status On-going      PT LONG TERM GOAL #3   Title Pt. will report 75% improvement in frequency/intensity of neck and back pain.    Time 6    Period Weeks    Status On-going   50% improvement in neck pain; 20% improvement in back pain     PT LONG TERM GOAL #4   Title Pt. will improve FOTO to at least 50 (lumbar) to demonstrate improved functional mobility    Baseline 39    Time 6    Period Weeks    Status On-going      PT LONG TERM GOAL #5   Title Pt. will be able to lift 25# without increased LBP.    Baseline avoids all lifting/pulling/pushing and overhead movements.    Time 6    Period Weeks    Status On-going                   Plan - 04/02/21 1445     Clinical Impression Statement Pt has noted  increased pain since Thanksgiving day last week. She was on her feet more than usual. Most of the session we did STM to her back muscles to decrease pain and stiffness. Added seated lumbar flexion stretch to do at home when she feels her pain is increasing. Provided education on home TENS unit for pain relief and gave education on precautions and rationale for use. She reported a decrease in pain after the soft tissue work today and stretches. Ended session with moist heat to decrease pain and increase tissue extensibility.    Personal Factors and Comorbidities Comorbidity 3+;Time since onset of injury/illness/exacerbation;Finances    Comorbidities HIstory of hernia repair, migraines, orthostatic hypotension, chronic neck and back pain.    PT Frequency 2x / week    PT Duration 6 weeks    PT Treatment/Interventions ADLs/Self Care Home Management;Cryotherapy;Electrical Stimulation;Aquatic Therapy;Ultrasound;Moist Heat;Traction;Stair training;Functional mobility training;Therapeutic activities;Therapeutic exercise;Neuromuscular re-education;Passive range of motion;Dry needling;Taping;Spinal Manipulations;Joint Manipulations    PT Next Visit Plan DN; initiate core strengthening program, modalities PRN    Consulted and Agree with Plan of Care Patient             Patient will benefit from skilled therapeutic intervention in order to improve the following deficits and impairments:  Decreased activity tolerance, Decreased endurance, Decreased range of motion, Decreased strength, Increased fascial restricitons, Impaired perceived functional ability, Improper body mechanics, Pain, Decreased mobility, Difficulty walking, Increased muscle spasms, Postural dysfunction  Visit Diagnosis: Cervicalgia  Chronic midline low back pain with bilateral sciatica  Muscle weakness (generalized)  Other muscle spasm  Difficulty in walking, not elsewhere classified     Problem List There are no problems to  display for this patient.   Darleene Cleaver, PTA 04/02/2021, 3:20 PM  [facility] [address]  [address] [place], [place], [address] Phone: [phone]   Fax:  [phone]  Name: Carla Hines MRN: [id number] Date of Birth: 1966-01-08

## 2021-04-04 ENCOUNTER — Ambulatory Visit: Payer: Medicaid Other | Admitting: Physical Therapy

## 2021-04-08 ENCOUNTER — Ambulatory Visit: Payer: Medicaid Other | Attending: Physical Medicine and Rehabilitation

## 2021-04-08 ENCOUNTER — Other Ambulatory Visit: Payer: Self-pay

## 2021-04-08 DIAGNOSIS — M5441 Lumbago with sciatica, right side: Secondary | ICD-10-CM | POA: Diagnosis present

## 2021-04-08 DIAGNOSIS — M5442 Lumbago with sciatica, left side: Secondary | ICD-10-CM | POA: Insufficient documentation

## 2021-04-08 DIAGNOSIS — M542 Cervicalgia: Secondary | ICD-10-CM | POA: Insufficient documentation

## 2021-04-08 DIAGNOSIS — M62838 Other muscle spasm: Secondary | ICD-10-CM | POA: Insufficient documentation

## 2021-04-08 DIAGNOSIS — G8929 Other chronic pain: Secondary | ICD-10-CM | POA: Insufficient documentation

## 2021-04-08 DIAGNOSIS — M6281 Muscle weakness (generalized): Secondary | ICD-10-CM | POA: Diagnosis present

## 2021-04-08 DIAGNOSIS — R262 Difficulty in walking, not elsewhere classified: Secondary | ICD-10-CM | POA: Insufficient documentation

## 2021-04-08 NOTE — Therapy (Signed)
[facility] [address]  [place], [place], [address] Phone: [phone]   Fax:  [phone]  Physical Therapy Treatment  Patient Details  Name: Carla Hines MRN: [id number] Date of Birth: [birth year] Referring Provider (PT): Normajean Glasgow, MD   Encounter Date: 04/08/2021   PT End of Session - 04/08/21 1028     Visit Number 9    Number of Visits 12    Date for PT Re-Evaluation 04/10/21    Authorization Type [employer]    Progress Note Due on Visit 10    PT Start Time 0932    PT Stop Time 1034    PT Time Calculation (min) 62 min    Activity Tolerance Patient tolerated treatment well;Patient limited by pain    Behavior During Therapy [facility] for tasks assessed/performed             Past Medical History:  Diagnosis Date   Acid reflux    Migraines     Past Surgical History:  Procedure Laterality Date   CERVIX SURGERY     HERNIA REPAIR      There were no vitals filed for this visit.   Subjective Assessment - 04/08/21 0935     Subjective Pt reports that she is feeling much better than last week because she had the flu. Having less intense pain now.    Pertinent History chronic neck and back pain, orthostatic hypotension    Diagnostic tests X-rays- unavailable for review.  Reports "leaking" disc in back and bulging disc in neck    Patient Stated Goals being able to function more and longer period of time    Currently in Pain? Yes    Pain Score 6     Pain Location Neck    Pain Orientation Right;Left    Pain Descriptors / Indicators Aching    Pain Type Chronic pain    Pain Score 6    Pain Location Back    Pain Orientation Mid;Lower    Pain Descriptors / Indicators Aching    Pain Type Chronic pain                [facility] PT Assessment - 04/08/21 0001       AROM   Cervical Flexion 43    Cervical Extension 51   mild pain   Cervical - Right Side Bend 32    Cervical - Left Side Bend 39    little pain   Cervical - Right Rotation WFL    Cervical - Left Rotation WFL                           [facility] Adult PT Treatment/Exercise - 04/08/21 0001       Self-Care   Self-Care Other Self-Care Comments    Other Self-Care Comments  edu on self massage with tennis balls      Lumbar Exercises: Aerobic   Nustep L4 x 6 min      Lumbar Exercises: Prone   Other Prone Lumbar Exercises press ups on elbows 5x10"      Lumbar Exercises: Quadruped   Madcat/Old Horse 10 reps    Madcat/Old Horse Limitations 2 sec    Other Quadruped Lumbar Exercises glute kicks 5 reps; stopped due to weakness and decreased tolerance for position      Modalities   Modalities Electrical Stimulation      Moist Heat Therapy   Number Minutes Moist  Heat 15 Minutes    Moist Heat Location Lumbar Spine      Electrical Stimulation   Electrical Stimulation Location thoracolumbar parapsinals    Electrical Stimulation Action IFC    Electrical Stimulation Parameters 80-150 Hz, intensity to pt tolerance    Electrical Stimulation Goals Pain      Manual Therapy   Manual Therapy Soft tissue mobilization    Soft tissue mobilization to B thoracolumbar paraspinals, QL, lats                       PT Short Term Goals - 03/08/21 1106       PT SHORT TERM GOAL #1   Title Independent with initial HEP    Time 2    Period Weeks    Status Achieved    Target Date 03/13/21               PT Long Term Goals - 04/08/21 1013       PT LONG TERM GOAL #1   Title Independent with progressed HEP to improve outcomes    Time 6    Period Weeks    Status On-going      PT LONG TERM GOAL #2   Title Patient will demonstrate full pain free cervical AROM for safety with driving and ADLs    Baseline slow gaurded movements, tightness throughout, increased pain with extension, frequent headaches.    Time 6    Period Weeks    Status On-going   improved but still notes that she turns her whole body  when checking blind spots     PT LONG TERM GOAL #3   Title Pt. will report 75% improvement in frequency/intensity of neck and back pain.    Time 6    Period Weeks    Status Partially Met   80% overall improvement     PT LONG TERM GOAL #4   Title Pt. will improve FOTO to at least 50 (lumbar) to demonstrate improved functional mobility    Baseline 39    Time 6    Period Weeks    Status On-going      PT LONG TERM GOAL #5   Title Pt. will be able to lift 25# without increased LBP.    Baseline avoids all lifting/pulling/pushing and overhead movements.    Time 6    Period Weeks    Status On-going                   Plan - 04/08/21 1038     Clinical Impression Statement Pt is very tight along the mid and lower back. She expressed interest in doing more DN and wants to continue with PT. Cervical ROM has improved but she still notes that she turns her whole body when checking blind spots. She had some fatigue with the quadruped hip extension. I spent time to educate her on self massage with tennis balls to relieve LBP and for suboccipital release. Ended session with estim and heat for pain relief and to increase extensibility of the parapsinal muscles.    Personal Factors and Comorbidities Comorbidity 3+;Time since onset of injury/illness/exacerbation;Finances    Comorbidities HIstory of hernia repair, migraines, orthostatic hypotension, chronic neck and back pain.    PT Frequency 2x / week    PT Duration 6 weeks    PT Treatment/Interventions ADLs/Self Care Home Management;Cryotherapy;Electrical Stimulation;Aquatic Therapy;Ultrasound;Moist Heat;Traction;Stair training;Functional mobility training;Therapeutic activities;Therapeutic exercise;Neuromuscular re-education;Passive range of motion;Dry needling;Taping;Spinal Manipulations;Joint Manipulations  PT Next Visit Plan progress note/recert    Consulted and Agree with Plan of Care Patient             Patient will benefit from  skilled therapeutic intervention in order to improve the following deficits and impairments:  Decreased activity tolerance, Decreased endurance, Decreased range of motion, Decreased strength, Increased fascial restricitons, Impaired perceived functional ability, Improper body mechanics, Pain, Decreased mobility, Difficulty walking, Increased muscle spasms, Postural dysfunction  Visit Diagnosis: Cervicalgia  Chronic midline low back pain with bilateral sciatica  Muscle weakness (generalized)  Other muscle spasm  Difficulty in walking, not elsewhere classified     Problem List There are no problems to display for this patient.   Artist Pais, PTA 04/08/2021, 10:47 AM  [facility] [address]  [place], [place], [address] Phone: [phone]   Fax:  [phone]  Name: Carla Hines MRN: [id number] Date of Birth: April 23, 1966

## 2021-04-10 ENCOUNTER — Encounter: Payer: Self-pay | Admitting: Physical Therapy

## 2021-04-10 ENCOUNTER — Ambulatory Visit: Payer: Medicaid Other | Admitting: Physical Therapy

## 2021-04-10 ENCOUNTER — Other Ambulatory Visit: Payer: Self-pay

## 2021-04-10 DIAGNOSIS — G8929 Other chronic pain: Secondary | ICD-10-CM

## 2021-04-10 DIAGNOSIS — M542 Cervicalgia: Secondary | ICD-10-CM

## 2021-04-10 DIAGNOSIS — M5441 Lumbago with sciatica, right side: Secondary | ICD-10-CM

## 2021-04-10 DIAGNOSIS — M6281 Muscle weakness (generalized): Secondary | ICD-10-CM

## 2021-04-10 DIAGNOSIS — M62838 Other muscle spasm: Secondary | ICD-10-CM

## 2021-04-10 DIAGNOSIS — R262 Difficulty in walking, not elsewhere classified: Secondary | ICD-10-CM

## 2021-04-10 NOTE — Addendum Note (Signed)
Addended by: Jena Gauss on: 04/10/2021 12:31 PM   Modules accepted: Orders

## 2021-04-10 NOTE — Therapy (Signed)
[facility] [address]  [address] [place], [place], [address] Phone: [phone]   Fax:  [phone]  Physical Therapy Treatment/Progress Note  Progress Note Reporting Period 02/27/2021 to 04/10/2021  See note below for Objective Data and Assessment of Progress/Goals.     Patient Details  Name: Carla Hines MRN: [id number] Date of Birth: [birth year] Referring Provider (PT): Claria Dice, MD   Encounter Date: 04/10/2021   PT End of Session - 04/10/21 0929     Visit Number 10    Number of Visits 12    Date for PT Re-Evaluation 05/22/21    Authorization Type [employer]    Progress Note Due on Visit 10    PT Start Time 0930    PT Stop Time 1015    PT Time Calculation (min) 45 min    Activity Tolerance Patient tolerated treatment well    Behavior During Therapy [facility] for tasks assessed/performed             Past Medical History:  Diagnosis Date   Acid reflux    Migraines     Past Surgical History:  Procedure Laterality Date   CERVIX SURGERY     HERNIA REPAIR      There were no vitals filed for this visit.   Subjective Assessment - 04/10/21 0931     Subjective Patient reports neck has been aggravated, thinks because fasted yesterday, afterwards neck was hurting more.  Has pain going down the side of her L leg, but thinks it has calmed down some since took medicine.    Pertinent History chronic neck and back pain, orthostatic hypotension    Diagnostic tests X-rays- unavailable for review.  Reports "leaking" disc in back and bulging disc in neck    Patient Stated Goals being able to function more and longer period of time    Currently in Pain? Yes    Pain Score 4     Pain Location Neck    Pain Score 3    Pain Location Back    Pain Orientation Left;Right;Lower    Pain Radiating Towards L side                [facility] PT Assessment - 04/10/21 0001       Assessment   Medical Diagnosis M54.2  (ICD-10-CM) - Cervical pain  M54.50 (ICD-10-CM) - Lumbar pain    Referring Provider (PT) Claria Dice, MD    Onset Date/Surgical Date 01/16/21      Precautions   Precautions None    Precaution Comments For work : no pushing or lifting more than 25 pounds, no lifting overhead      Restrictions   Weight Bearing Restrictions No      Observation/Other Assessments   Focus on Therapeutic Outcomes (FOTO)  45      AROM   Cervical Flexion 60    Cervical Extension 45    Cervical - Right Rotation 70    Cervical - Left Rotation 70                           [facility] Adult PT Treatment/Exercise - 04/10/21 0001       Lumbar Exercises: Aerobic   Nustep L4 x 4 min      Manual Therapy   Manual Therapy Soft tissue mobilization;Joint mobilization;Myofascial release;Other (comment)    Manual therapy comments to decrease muscle spasm and pain    Joint Mobilization  PA mobs lumbar spine grade 1-2    Soft tissue mobilization STM to bil UT, L/S, cervical paraspinals    Myofascial Release MFR and TPR to bil QL and glutes    Other Manual Therapy dry needling              Trigger Point Dry Needling - 04/10/21 0001     Consent Given? Yes    Education Handout Provided Previously provided    Muscles Treated Head and Neck Upper trapezius;Levator scapulae;Cervical multifidi   bilateral   Upper Trapezius Response Twitch reponse elicited;Palpable increased muscle length    Levator Scapulae Response Twitch response elicited;Palpable increased muscle length    Cervical multifidi Response Twitch reponse elicited;Palpable increased muscle length   bil C4,5,6                    PT Short Term Goals - 03/08/21 1106       PT SHORT TERM GOAL #1   Title Independent with initial HEP    Time 2    Period Weeks    Status Achieved    Target Date 03/13/21               PT Long Term Goals - 04/10/21 0929       PT LONG TERM GOAL #1   Title Independent with progressed HEP to  improve outcomes    Time 6    Period Weeks    Status On-going    Target Date 05/22/21      PT LONG TERM GOAL #2   Title Patient will demonstrate full pain free cervical AROM for safety with driving and ADLs    Baseline slow gaurded movements, tightness throughout, increased pain with extension, frequent headaches.    Time 6    Period Weeks    Status On-going   improved but still notes that she turns her whole body when checking blind spots. 04/10/21- 70 rotation bil pain end range L, 60 flexion, pain end range, 45 extension, tightness anterior cervical muscles.   Target Date 05/22/21      PT LONG TERM GOAL #3   Title Pt. will report 75% improvement in frequency/intensity of neck and back pain.    Time 6    Period Weeks    Status Achieved   04/10/21- 80% overall improvement   Target Date 05/22/21      PT LONG TERM GOAL #4   Title Pt. will improve FOTO to at least 50 (lumbar) to demonstrate improved functional mobility    Baseline 39    Time 6    Period Weeks    Status On-going   04/10/21- 45   Target Date 05/22/21      PT LONG TERM GOAL #5   Title Pt. will be able to lift 25# without increased LBP.    Baseline avoids all lifting/pulling/pushing and overhead movements.    Time 6    Period Weeks    Status On-going   04/10/21- deferred today   Target Date 05/22/21                   Plan - 04/10/21 1218     Clinical Impression Statement Patient reports significant decrease in pain overall in low back and neck, but functionally she is still very limited.  She was very emotional today, feeling overwhelmed by her inability to find a job and contribute financially to household, and difficulty with performing household activities like cooking and cleaning as well.  Her FOTO has improved from 39 to 45% for back.  Because of her emmotional state, focused skilled therapy today on manual therapy to decrease pain, while continuing education on pain neuroscience.  She requested dry  needling for her neck today, as she did feel it was helpful, and tolerated well, demonstrating improving cervical ROM especially for flexion, although she still reports some pulling in neck, and pain at end range with L rotation.  Recommend continuing skilled physical therapy for additional 2x/week for 6 weeks.  Discussed today transfer to [facility] to trial aquatic therapy, in order to allow her to perform more aerobic activities to continue strengthening and to improve perceived physical ability and functioning.    Personal Factors and Comorbidities Comorbidity 3+;Time since onset of injury/illness/exacerbation;Finances    Comorbidities HIstory of hernia repair, migraines, orthostatic hypotension, chronic neck and back pain.    PT Frequency 2x / week    PT Duration 6 weeks    PT Treatment/Interventions ADLs/Self Care Home Management;Cryotherapy;Electrical Stimulation;Aquatic Therapy;Ultrasound;Moist Heat;Traction;Stair training;Functional mobility training;Therapeutic activities;Therapeutic exercise;Neuromuscular re-education;Passive range of motion;Dry needling;Taping;Spinal Manipulations;Joint Manipulations    PT Next Visit Plan aquatic therapy for aerobic activity, continue strengthening, introduce lifting with light resistance.    Consulted and Agree with Plan of Care Patient             Patient will benefit from skilled therapeutic intervention in order to improve the following deficits and impairments:  Decreased activity tolerance, Decreased endurance, Decreased range of motion, Decreased strength, Increased fascial restricitons, Impaired perceived functional ability, Improper body mechanics, Pain, Decreased mobility, Difficulty walking, Increased muscle spasms, Postural dysfunction  Visit Diagnosis: Cervicalgia  Chronic midline low back pain with bilateral sciatica  Muscle weakness (generalized)  Other muscle spasm  Difficulty in walking, not elsewhere classified     Problem  List There are no problems to display for this patient.   Jena Gauss, PT, DPT  04/10/2021, 12:27 PM  [facility] [address]  [address] [place], [place], [address] Phone: [phone]   Fax:  [phone]  Name: Carla Hines MRN: [id number] Date of Birth: 10-24-1965

## 2021-04-19 ENCOUNTER — Other Ambulatory Visit: Payer: Self-pay

## 2021-04-19 ENCOUNTER — Ambulatory Visit: Payer: Medicaid Other

## 2021-04-19 DIAGNOSIS — R262 Difficulty in walking, not elsewhere classified: Secondary | ICD-10-CM

## 2021-04-19 DIAGNOSIS — M542 Cervicalgia: Secondary | ICD-10-CM

## 2021-04-19 DIAGNOSIS — M62838 Other muscle spasm: Secondary | ICD-10-CM

## 2021-04-19 DIAGNOSIS — G8929 Other chronic pain: Secondary | ICD-10-CM

## 2021-04-19 DIAGNOSIS — M6281 Muscle weakness (generalized): Secondary | ICD-10-CM

## 2021-04-19 NOTE — Therapy (Signed)
[facility] [address]  [address] [place], [place], [address] Phone: [phone]   Fax:  [phone]  Physical Therapy Treatment  Patient Details  Name: Carla Hines MRN: [id number] Date of Birth: [birth year] Referring Provider (PT): Claria Dice, MD   Encounter Date: 04/19/2021   PT End of Session - 04/19/21 1209     Visit Number 11    Number of Visits 12    Date for PT Re-Evaluation 05/22/21    Authorization Type [employer]    Progress Note Due on Visit 10    PT Start Time 1017    PT Stop Time 1110    PT Time Calculation (min) 53 min    Activity Tolerance Patient tolerated treatment well    Behavior During Therapy [facility] for tasks assessed/performed             Past Medical History:  Diagnosis Date   Acid reflux    Migraines     Past Surgical History:  Procedure Laterality Date   CERVIX SURGERY     HERNIA REPAIR      There were no vitals filed for this visit.   Subjective Assessment - 04/19/21 1022     Subjective Pt reports that she has been feeling dizzy lately when walking. Because she has missed PT for about a week she feels that she has been set back.    Pertinent History chronic neck and back pain, orthostatic hypotension    Diagnostic tests X-rays- unavailable for review.  Reports "leaking" disc in back and bulging disc in neck    Patient Stated Goals being able to function more and longer period of time    Currently in Pain? Yes    Pain Score 6     Pain Location Back    Pain Orientation Right;Left    Pain Descriptors / Indicators Aching    Pain Type Chronic pain                               [facility] Adult PT Treatment/Exercise - 04/19/21 0001       Lumbar Exercises: Aerobic   Tread Mill no incline, 1.2 mph,   Nustep L5x49min      Lumbar Exercises: Machines for Strengthening   Other Lumbar Machine Exercise lat pull and rows 15lb each 2x10      Lumbar  Exercises: Seated   Long Arc Quad on Chair Strengthening;Both;10 reps    LAQ on Chair Limitations red TB    Other Seated Lumbar Exercises HS curls with red TB 10x    Other Seated Lumbar Exercises ball squeezes 10x2"      Electrical Stimulation   Electrical Stimulation Location thoracolumbar parapsinals    Electrical Stimulation Action IFC    Electrical Stimulation Parameters 80-150Hz , intensity to tolerance    Electrical Stimulation Goals Pain                       PT Short Term Goals - 03/08/21 1106       PT SHORT TERM GOAL #1   Title Independent with initial HEP    Time 2    Period Weeks    Status Achieved    Target Date 03/13/21               PT Long Term Goals - 04/10/21 0929       PT LONG TERM GOAL #  1   Title Independent with progressed HEP to improve outcomes    Time 6    Period Weeks    Status On-going    Target Date 05/22/21      PT LONG TERM GOAL #2   Title Patient will demonstrate full pain free cervical AROM for safety with driving and ADLs    Baseline slow gaurded movements, tightness throughout, increased pain with extension, frequent headaches.    Time 6    Period Weeks    Status On-going   improved but still notes that she turns her whole body when checking blind spots. 04/10/21- 70 rotation bil pain end range L, 60 flexion, pain end range, 45 extension, tightness anterior cervical muscles.   Target Date 05/22/21      PT LONG TERM GOAL #3   Title Pt. will report 75% improvement in frequency/intensity of neck and back pain.    Time 6    Period Weeks    Status Achieved   04/10/21- 80% overall improvement   Target Date 05/22/21      PT LONG TERM GOAL #4   Title Pt. will improve FOTO to at least 50 (lumbar) to demonstrate improved functional mobility    Baseline 39    Time 6    Period Weeks    Status On-going   04/10/21- 45   Target Date 05/22/21      PT LONG TERM GOAL #5   Title Pt. will be able to lift 25# without increased LBP.     Baseline avoids all lifting/pulling/pushing and overhead movements.    Time 6    Period Weeks    Status On-going   04/10/21- deferred today   Target Date 05/22/21                   Plan - 04/19/21 1210     Clinical Impression Statement Pt was still in pain starting the session today. We took more of general strengthening approach today. She reported some relief with the scap strengthening exercises in the lower back. She progressed well through the exercises and even noted decreased pain afterwards. Ended session with estim and heat to reduce pain.    Personal Factors and Comorbidities Comorbidity 3+;Time since onset of injury/illness/exacerbation;Finances    Comorbidities HIstory of hernia repair, migraines, orthostatic hypotension, chronic neck and back pain.    PT Frequency 2x / week    PT Duration 6 weeks    PT Treatment/Interventions ADLs/Self Care Home Management;Cryotherapy;Electrical Stimulation;Aquatic Therapy;Ultrasound;Moist Heat;Traction;Stair training;Functional mobility training;Therapeutic activities;Therapeutic exercise;Neuromuscular re-education;Passive range of motion;Dry needling;Taping;Spinal Manipulations;Joint Manipulations    PT Next Visit Plan aquatic therapy for aerobic activity, continue strengthening, introduce lifting with light resistance.    Consulted and Agree with Plan of Care Patient             Patient will benefit from skilled therapeutic intervention in order to improve the following deficits and impairments:  Decreased activity tolerance, Decreased endurance, Decreased range of motion, Decreased strength, Increased fascial restricitons, Impaired perceived functional ability, Improper body mechanics, Pain, Decreased mobility, Difficulty walking, Increased muscle spasms, Postural dysfunction  Visit Diagnosis: Cervicalgia  Chronic midline low back pain with bilateral sciatica  Muscle weakness (generalized)  Other muscle  spasm  Difficulty in walking, not elsewhere classified     Problem List There are no problems to display for this patient.   Darleene Cleaver, PTA 04/19/2021, 12:11 PM  [facility] [address]  [address] [place], [place], [address] Phone: [phone]   Fax:  [phone]  Name: Audrea Bolte MRN: [id number] Date of Birth: 1966/01/09

## 2021-04-23 ENCOUNTER — Ambulatory Visit: Payer: Medicaid Other

## 2021-04-24 ENCOUNTER — Encounter: Payer: Self-pay | Admitting: Physical Therapy

## 2021-04-24 ENCOUNTER — Ambulatory Visit: Payer: Medicaid Other | Admitting: Physical Therapy

## 2021-04-24 ENCOUNTER — Other Ambulatory Visit: Payer: Self-pay

## 2021-04-24 DIAGNOSIS — M62838 Other muscle spasm: Secondary | ICD-10-CM

## 2021-04-24 DIAGNOSIS — M542 Cervicalgia: Secondary | ICD-10-CM

## 2021-04-24 DIAGNOSIS — M6281 Muscle weakness (generalized): Secondary | ICD-10-CM

## 2021-04-24 DIAGNOSIS — R262 Difficulty in walking, not elsewhere classified: Secondary | ICD-10-CM

## 2021-04-24 DIAGNOSIS — M5442 Lumbago with sciatica, left side: Secondary | ICD-10-CM

## 2021-04-25 NOTE — Therapy (Signed)
[facility] Outpatient Rehabilitation [facility] [address]  [address] [place], [place], [address] Phone: [phone]   Fax:  [phone]  Physical Therapy Treatment  Patient Details  Name: Carla Hines MRN: [id number] Date of Birth: Aug 14, 1965 Referring Provider (PT): Claria Dice, MD   Encounter Date: 04/24/2021   PT End of Session - 04/24/21 0936     Visit Number 12    Number of Visits 20    Date for PT Re-Evaluation 05/22/21   new authorization needed 05/04/21   Authorization Type [employer]    Authorization Time Period 12/16-12/31 - 8 additional units    Progress Note Due on Visit 10    PT Start Time 0933    PT Stop Time 1025    PT Time Calculation (min) 52 min    Activity Tolerance Patient tolerated treatment well    Behavior During Therapy [facility] for tasks assessed/performed             Past Medical History:  Diagnosis Date   Acid reflux    Migraines     Past Surgical History:  Procedure Laterality Date   CERVIX SURGERY     HERNIA REPAIR      There were no vitals filed for this visit.   Subjective Assessment - 04/24/21 0935     Subjective Pt. reports no dizziness yesterday.  Feeling better today, neck only a 2/10, but her back is hurting worse when she woke up, 6/10 but after medicine now 4/10.   Feels like her medicine is not lasting through the night.    Pertinent History chronic neck and back pain, orthostatic hypotension    Diagnostic tests X-rays- unavailable for review.  Reports "leaking" disc in back and bulging disc in neck    Patient Stated Goals being able to function more and longer period of time    Currently in Pain? Yes    Pain Score 4     Pain Location Back    Pain Orientation Lower    Pain Score 2    Pain Location Neck                               [facility] Adult PT Treatment/Exercise - 04/25/21 0001       Neck Exercises: Supine   Other Supine Exercise self thoracic mobs with foam  roller (back bends over) x 10      Neck Exercises: Sidelying   Other Sidelying Exercise open book x 10 bil      Lumbar Exercises: Aerobic   Nustep L5 x 6 min      Lumbar Exercises: Machines for Strengthening   Leg Press 35# 2 x 10    Other Lumbar Machine Exercise lat pull 15# 1 x 10, 20# 1 x 10    Other Lumbar Machine Exercise rows 20# 3 x 10      Modalities   Modalities Moist Heat      Moist Heat Therapy   Number Minutes Moist Heat 10 Minutes    Moist Heat Location Lumbar Spine      Manual Therapy   Manual Therapy Soft tissue mobilization;Joint mobilization    Manual therapy comments in prone to LBP to decrease pain    Joint Mobilization PA mobs lumbar spine grade 2-3    Soft tissue mobilization STM with foam roller to bil glutes, QL and lumbar paraspinals  PT Short Term Goals - 03/08/21 1106       PT SHORT TERM GOAL #1   Title Independent with initial HEP    Time 2    Period Weeks    Status Achieved    Target Date 03/13/21               PT Long Term Goals - 04/10/21 0929       PT LONG TERM GOAL #1   Title Independent with progressed HEP to improve outcomes    Time 6    Period Weeks    Status On-going    Target Date 05/22/21      PT LONG TERM GOAL #2   Title Patient will demonstrate full pain free cervical AROM for safety with driving and ADLs    Baseline slow gaurded movements, tightness throughout, increased pain with extension, frequent headaches.    Time 6    Period Weeks    Status On-going   improved but still notes that she turns her whole body when checking blind spots. 04/10/21- 70 rotation bil pain end range L, 60 flexion, pain end range, 45 extension, tightness anterior cervical muscles.   Target Date 05/22/21      PT LONG TERM GOAL #3   Title Pt. will report 75% improvement in frequency/intensity of neck and back pain.    Time 6    Period Weeks    Status Achieved   04/10/21- 80% overall improvement    Target Date 05/22/21      PT LONG TERM GOAL #4   Title Pt. will improve FOTO to at least 50 (lumbar) to demonstrate improved functional mobility    Baseline 39    Time 6    Period Weeks    Status On-going   04/10/21- 45   Target Date 05/22/21      PT LONG TERM GOAL #5   Title Pt. will be able to lift 25# without increased LBP.    Baseline avoids all lifting/pulling/pushing and overhead movements.    Time 6    Period Weeks    Status On-going   04/10/21- deferred today   Target Date 05/22/21                   Plan - 04/25/21 1251     Clinical Impression Statement Patient reports more back then neck pain today, but requested continue exercise using weight machines as this is not something she can do at home.  She tolerated progression of exercises with decreased complaint of pain, educated throughout on pain neuroscience and how exercise can improve endorphins in brain and improve "wet brain" to help decrease pain, and goal is to continue to increase activity level and exercise to decrease pain.  Finished session with brief manual therapy to low back/glutes and MHP, reported significant decrease in pain following interventions.    Personal Factors and Comorbidities Comorbidity 3+;Time since onset of injury/illness/exacerbation;Finances    Comorbidities HIstory of hernia repair, migraines, orthostatic hypotension, chronic neck and back pain.    PT Frequency 2x / week    PT Duration 6 weeks    PT Treatment/Interventions ADLs/Self Care Home Management;Cryotherapy;Electrical Stimulation;Aquatic Therapy;Ultrasound;Moist Heat;Traction;Stair training;Functional mobility training;Therapeutic activities;Therapeutic exercise;Neuromuscular re-education;Passive range of motion;Dry needling;Taping;Spinal Manipulations;Joint Manipulations    PT Next Visit Plan aquatic therapy for aerobic activity, continue strengthening, introduce lifting with light resistance.    Consulted and Agree with Plan of  Care Patient  Patient will benefit from skilled therapeutic intervention in order to improve the following deficits and impairments:  Decreased activity tolerance, Decreased endurance, Decreased range of motion, Decreased strength, Increased fascial restricitons, Impaired perceived functional ability, Improper body mechanics, Pain, Decreased mobility, Difficulty walking, Increased muscle spasms, Postural dysfunction  Visit Diagnosis: Cervicalgia  Chronic midline low back pain with bilateral sciatica  Muscle weakness (generalized)  Other muscle spasm  Difficulty in walking, not elsewhere classified     Problem List There are no problems to display for this patient.   Jena Gauss, PT, DPT  04/25/2021, 12:58 PM  [facility] [address]  [address] [place], [place], [address] Phone: [phone]   Fax:  [phone]  Name: Dalinda Heidt MRN: [id number] Date of Birth: Dec 27, 1965

## 2021-04-30 ENCOUNTER — Ambulatory Visit: Payer: Medicaid Other

## 2021-04-30 ENCOUNTER — Other Ambulatory Visit: Payer: Self-pay

## 2021-04-30 DIAGNOSIS — M542 Cervicalgia: Secondary | ICD-10-CM

## 2021-04-30 DIAGNOSIS — R262 Difficulty in walking, not elsewhere classified: Secondary | ICD-10-CM

## 2021-04-30 DIAGNOSIS — M62838 Other muscle spasm: Secondary | ICD-10-CM

## 2021-04-30 DIAGNOSIS — M6281 Muscle weakness (generalized): Secondary | ICD-10-CM

## 2021-04-30 DIAGNOSIS — M5442 Lumbago with sciatica, left side: Secondary | ICD-10-CM

## 2021-04-30 NOTE — Therapy (Signed)
[facility] Outpatient Rehabilitation [facility] [address]  [address] [place], [place], [address] Phone: [phone]   Fax:  [phone]  Physical Therapy Treatment  Patient Details  Name: Carla Hines MRN: [id number] Date of Birth: 04-05-1966 Referring Provider (PT): Claria Dice, MD   Encounter Date: 04/30/2021   PT End of Session - 04/30/21 1723     Visit Number 13    Number of Visits 20    Date for PT Re-Evaluation 05/22/21   new authorization needed 05/04/21   Authorization Type [employer]    Authorization Time Period 12/16-12/31 - 8 additional units    Progress Note Due on Visit 10    PT Start Time 1533    PT Stop Time 1615    PT Time Calculation (min) 42 min    Activity Tolerance Patient tolerated treatment well    Behavior During Therapy [facility] for tasks assessed/performed             Past Medical History:  Diagnosis Date   Acid reflux    Migraines     Past Surgical History:  Procedure Laterality Date   CERVIX SURGERY     HERNIA REPAIR      There were no vitals filed for this visit.   Subjective Assessment - 04/30/21 1536     Subjective Pt reports no dizziness with less pain today.    Pertinent History chronic neck and back pain, orthostatic hypotension    Diagnostic tests X-rays- unavailable for review.  Reports "leaking" disc in back and bulging disc in neck    Patient Stated Goals being able to function more and longer period of time    Currently in Pain? Yes    Pain Score 3     Pain Location Back   groin, low and midback                              [facility] Adult PT Treatment/Exercise - 04/30/21 0001       Lumbar Exercises: Stretches   Other Lumbar Stretch Exercise lumbar flexion stretch in standing 3x10"      Lumbar Exercises: Aerobic   Tread Mill 0.5 incline, 1.0 mph,    Nustep L5 x 6 min      Lumbar Exercises: Machines for Strengthening   Other Lumbar Machine Exercise standing lat  pull 10lb 2x10 reps    Other Lumbar Machine Exercise pallof press with weight machine 5lb 10 reps   more difficulty and pain noted with this     Lumbar Exercises: Standing   Other Standing Lumbar Exercises OHP with yellow weight ball 2x10      Lumbar Exercises: Quadruped   Other Quadruped Lumbar Exercises multifidi hip raises 10x      Manual Therapy   Manual Therapy Manual Traction    Manual Traction to lumbar spine, inf hip glide with prolonged hold, S/L sustained traction                     PT Education - 04/30/21 1724     Education Details HEP update; Access Code: [email or web address]    Person(s) Educated Patient    Methods Explanation;Verbal cues    Comprehension Verbalized understanding              PT Short Term Goals - 03/08/21 1106       PT SHORT TERM GOAL #1   Title Independent with initial HEP  Time 2    Period Weeks    Status Achieved    Target Date 03/13/21               PT Long Term Goals - 04/10/21 0929       PT LONG TERM GOAL #1   Title Independent with progressed HEP to improve outcomes    Time 6    Period Weeks    Status On-going    Target Date 05/22/21      PT LONG TERM GOAL #2   Title Patient will demonstrate full pain free cervical AROM for safety with driving and ADLs    Baseline slow gaurded movements, tightness throughout, increased pain with extension, frequent headaches.    Time 6    Period Weeks    Status On-going   improved but still notes that she turns her whole body when checking blind spots. 04/10/21- 70 rotation bil pain end range L, 60 flexion, pain end range, 45 extension, tightness anterior cervical muscles.   Target Date 05/22/21      PT LONG TERM GOAL #3   Title Pt. will report 75% improvement in frequency/intensity of neck and back pain.    Time 6    Period Weeks    Status Achieved   04/10/21- 80% overall improvement   Target Date 05/22/21      PT LONG TERM GOAL #4   Title Pt. will improve FOTO to at least  50 (lumbar) to demonstrate improved functional mobility    Baseline 39    Time 6    Period Weeks    Status On-going   04/10/21- 45   Target Date 05/22/21      PT LONG TERM GOAL #5   Title Pt. will be able to lift 25# without increased LBP.    Baseline avoids all lifting/pulling/pushing and overhead movements.    Time 6    Period Weeks    Status On-going   04/10/21- deferred today   Target Date 05/22/21                   Plan - 04/30/21 1629     Clinical Impression Statement More pain noted today with the pallof press. She was limited by UE weakness with the OHP and quadruped exercise. She was able to increase her distance on the treadmill today with no increase LBP. She continues to do well with the more general apporach to exercises reporting less pain starting today's session.    Personal Factors and Comorbidities Comorbidity 3+;Time since onset of injury/illness/exacerbation;Finances    Comorbidities HIstory of hernia repair, migraines, orthostatic hypotension, chronic neck and back pain.    PT Frequency 2x / week    PT Duration 6 weeks    PT Treatment/Interventions ADLs/Self Care Home Management;Cryotherapy;Electrical Stimulation;Aquatic Therapy;Ultrasound;Moist Heat;Traction;Stair training;Functional mobility training;Therapeutic activities;Therapeutic exercise;Neuromuscular re-education;Passive range of motion;Dry needling;Taping;Spinal Manipulations;Joint Manipulations    PT Next Visit Plan aquatic therapy for aerobic activity, continue strengthening, introduce lifting with light resistance.    Consulted and Agree with Plan of Care Patient             Patient will benefit from skilled therapeutic intervention in order to improve the following deficits and impairments:  Decreased activity tolerance, Decreased endurance, Decreased range of motion, Decreased strength, Increased fascial restricitons, Impaired perceived functional ability, Improper body mechanics, Pain,  Decreased mobility, Difficulty walking, Increased muscle spasms, Postural dysfunction  Visit Diagnosis: Cervicalgia  Chronic midline low back pain with bilateral sciatica  Muscle  weakness (generalized)  Other muscle spasm  Difficulty in walking, not elsewhere classified     Problem List There are no problems to display for this patient.   Darleene Cleaver, PTA 04/30/2021, 5:27 PM  [facility] [address]  [address] [place], [place], [address] Phone: [phone]   Fax:  [phone]  Name: Kensly Bowmer MRN: [id number] Date of Birth: 03/03/1966

## 2021-05-02 ENCOUNTER — Ambulatory Visit: Payer: Medicaid Other

## 2021-05-02 ENCOUNTER — Other Ambulatory Visit: Payer: Self-pay

## 2021-05-02 DIAGNOSIS — M6281 Muscle weakness (generalized): Secondary | ICD-10-CM

## 2021-05-02 DIAGNOSIS — R262 Difficulty in walking, not elsewhere classified: Secondary | ICD-10-CM

## 2021-05-02 DIAGNOSIS — M542 Cervicalgia: Secondary | ICD-10-CM

## 2021-05-02 DIAGNOSIS — M62838 Other muscle spasm: Secondary | ICD-10-CM

## 2021-05-02 DIAGNOSIS — G8929 Other chronic pain: Secondary | ICD-10-CM

## 2021-05-02 NOTE — Therapy (Signed)
[facility] Outpatient Rehabilitation [facility] [address]  [address] [place], [place], [address] Phone: [phone]   Fax:  [phone]  Physical Therapy Treatment  Patient Details  Name: Carla Hines MRN: [id number] Date of Birth: [birth year] Referring Provider (PT): Claria Dice, MD   Encounter Date: 05/02/2021   PT End of Session - 05/02/21 1145     Visit Number 14    Number of Visits 20    Date for PT Re-Evaluation 05/22/21   new authorization needed 05/04/21   Authorization Type [employer] Medicaid    Authorization Time Period 12/16-12/31 - 8 additional units    PT Start Time 1021    PT Stop Time 1108    PT Time Calculation (min) 47 min    Activity Tolerance Patient tolerated treatment well;Patient limited by fatigue    Behavior During Therapy [facility] for tasks assessed/performed             Past Medical History:  Diagnosis Date   Acid reflux    Migraines     Past Surgical History:  Procedure Laterality Date   CERVIX SURGERY     HERNIA REPAIR      There were no vitals filed for this visit.   Subjective Assessment - 05/02/21 1022     Subjective "Yesterday I went walking, tried to push myself but afterwards my back starting hurting worse, last night I could barely get comfortable."    Pertinent History chronic neck and back pain, orthostatic hypotension    Diagnostic tests X-rays- unavailable for review.  Reports "leaking" disc in back and bulging disc in neck    Patient Stated Goals being able to function more and longer period of time    Currently in Pain? Yes    Pain Score 8     Pain Location Hip    Pain Orientation Right;Left    Pain Descriptors / Indicators Aching    Pain Type Chronic pain                [facility] PT Assessment - 05/02/21 0001       Assessment   Medical Diagnosis M54.2 (ICD-10-CM) - Cervical pain  M54.50 (ICD-10-CM) - Lumbar pain    Referring Provider (PT) Claria Dice, MD    Onset Date/Surgical Date 01/16/21       Observation/Other Assessments   Focus on Therapeutic Outcomes (FOTO)  40%, 45% on visit 10                           [facility] Adult PT Treatment/Exercise - 05/02/21 0001       Neck Exercises: Machines for Strengthening   UBE (Upper Arm Bike) L2x4 min ( fwd/61minback)      Lumbar Exercises: Stretches   Other Lumbar Stretch Exercise childs pose stretch 30 sec hold      Lumbar Exercises: Supine   Other Supine Lumbar Exercises B shoulder flexion with yellow weight ball 10x    Other Supine Lumbar Exercises planks 4x5 sec holds      Lumbar Exercises: Sidelying   Hip Abduction Both;5 reps      Lumbar Exercises: Quadruped   Opposite Arm/Leg Raise Right arm/Left leg;Left arm/Right leg;10 reps;2 seconds    Opposite Arm/Leg Raise Limitations with manual perturbations    Plank 3x5 sec hold      Manual Therapy   Manual Therapy Manual Traction    Manual therapy comments supine    Manual Traction to lumbar  spine, inf hip glide with prolonged hold                       PT Short Term Goals - 03/08/21 1106       PT SHORT TERM GOAL #1   Title Independent with initial HEP    Time 2    Period Weeks    Status Achieved    Target Date 03/13/21               PT Long Term Goals - 05/02/21 1038       PT LONG TERM GOAL #1   Title Independent with progressed HEP to improve outcomes    Time 6    Period Weeks    Status On-going    Target Date 05/22/21      PT LONG TERM GOAL #2   Title Patient will demonstrate full pain free cervical AROM for safety with driving and ADLs    Baseline slow gaurded movements, tightness throughout, increased pain with extension, frequent headaches.    Time 6    Period Weeks    Status On-going   improved but still notes that she turns her whole body when checking blind spots. 04/10/21- 70 rotation bil pain end range L, 60 flexion, pain end range, 45 extension, tightness anterior cervical muscles.   Target Date 05/22/21       PT LONG TERM GOAL #3   Title Pt. will report 75% improvement in frequency/intensity of neck and back pain.    Time 6    Period Weeks    Status Achieved   04/10/21- 80% overall improvement   Target Date 05/22/21      PT LONG TERM GOAL #4   Title Pt. will improve FOTO to at least 50 (lumbar) to demonstrate improved functional mobility    Baseline 39    Time 6    Period Weeks    Status On-going   04/10/21- 45   Target Date 05/22/21      PT LONG TERM GOAL #5   Title Pt. will be able to lift 25# without increased LBP.    Baseline avoids all lifting/pulling/pushing and overhead movements.    Time 6    Period Weeks    Status On-going   05/02/21- still having difficulty with lifting a pot full of water and laundry baskets   Target Date 05/22/21                   Plan - 05/02/21 1145     Clinical Impression Statement Pt is making progress with PT, pain levels overall have improved despite overdoing it yesterday. She still notes having trouble with lifting water pots and laundry baskets around the house. Educated her on breaking up exercises doing more stretching/mobility exercises one day then focusing on strength the next day. Reminded her to beware of her symptoms when walking to avoid overdoing exercises and to revert to seated exercises if need be. She also noted that she makes improvement in back pain with PT but when she goes without it for a while her pain becomes worse. She would continue to benefit from PT to improve function with carrying items at home and to improve her ability to walk w/o limitation of LBP.    Personal Factors and Comorbidities Comorbidity 3+;Time since onset of injury/illness/exacerbation;Finances    Comorbidities HIstory of hernia repair, migraines, orthostatic hypotension, chronic neck and back pain.    PT Frequency 2x / week  PT Duration 6 weeks    PT Treatment/Interventions ADLs/Self Care Home Management;Cryotherapy;Electrical  Stimulation;Aquatic Therapy;Ultrasound;Moist Heat;Traction;Stair training;Functional mobility training;Therapeutic activities;Therapeutic exercise;Neuromuscular re-education;Passive range of motion;Dry needling;Taping;Spinal Manipulations;Joint Manipulations    PT Next Visit Plan aquatic therapy for aerobic activity, continue strengthening, introduce lifting with light resistance.    Consulted and Agree with Plan of Care Patient             Patient will benefit from skilled therapeutic intervention in order to improve the following deficits and impairments:  Decreased activity tolerance, Decreased endurance, Decreased range of motion, Decreased strength, Increased fascial restricitons, Impaired perceived functional ability, Improper body mechanics, Pain, Decreased mobility, Difficulty walking, Increased muscle spasms, Postural dysfunction  Visit Diagnosis: Cervicalgia  Chronic midline low back pain with bilateral sciatica  Muscle weakness (generalized)  Other muscle spasm  Difficulty in walking, not elsewhere classified     Problem List There are no problems to display for this patient.   Darleene Cleaver, PTA 05/02/2021, 11:47 AM  [facility] [address]  [address] [place], [place], [address] Phone: [phone]   Fax:  [phone]  Name: Carla Hines MRN: [id number] Date of Birth: Aug 21, 1965

## 2021-05-15 ENCOUNTER — Other Ambulatory Visit: Payer: Self-pay

## 2021-05-15 ENCOUNTER — Ambulatory Visit: Payer: Medicaid Other | Attending: Physical Medicine and Rehabilitation

## 2021-05-15 DIAGNOSIS — M5442 Lumbago with sciatica, left side: Secondary | ICD-10-CM | POA: Diagnosis present

## 2021-05-15 DIAGNOSIS — M6281 Muscle weakness (generalized): Secondary | ICD-10-CM | POA: Diagnosis present

## 2021-05-15 DIAGNOSIS — R262 Difficulty in walking, not elsewhere classified: Secondary | ICD-10-CM | POA: Diagnosis present

## 2021-05-15 DIAGNOSIS — G8929 Other chronic pain: Secondary | ICD-10-CM | POA: Diagnosis present

## 2021-05-15 DIAGNOSIS — M542 Cervicalgia: Secondary | ICD-10-CM | POA: Insufficient documentation

## 2021-05-15 DIAGNOSIS — M62838 Other muscle spasm: Secondary | ICD-10-CM | POA: Diagnosis present

## 2021-05-15 DIAGNOSIS — M5441 Lumbago with sciatica, right side: Secondary | ICD-10-CM | POA: Diagnosis present

## 2021-05-15 NOTE — Therapy (Signed)
[facility] [address]  [address] [place], [place], [address] Phone: [phone]   Fax:  [phone]  Physical Therapy Treatment  Patient Details  Name: Carla Hines MRN: [id number] Date of Birth: 07-31-1965 Referring Provider (PT): Claria Dice, MD   Encounter Date: 05/15/2021   PT End of Session - 05/15/21 1205     Visit Number 15    Number of Visits 20    Date for PT Re-Evaluation 05/22/21    Authorization Type [employer]    Authorization Time Period first 12 visit don't require prior auth    PT Start Time 1018    PT Stop Time 1101    PT Time Calculation (min) 43 min    Activity Tolerance Patient tolerated treatment well    Behavior During Therapy [facility] for tasks assessed/performed             Past Medical History:  Diagnosis Date   Acid reflux    Migraines     Past Surgical History:  Procedure Laterality Date   CERVIX SURGERY     HERNIA REPAIR      There were no vitals filed for this visit.   Subjective Assessment - 05/15/21 1020     Subjective Pt reports that the exercises have been helping but when she does her fasting she notices some R arm pain radiating to the finger tips.    Pertinent History chronic neck and back pain, orthostatic hypotension    Diagnostic tests X-rays- unavailable for review.  Reports "leaking" disc in back and bulging disc in neck    Patient Stated Goals being able to function more and longer period of time    Currently in Pain? Yes    Pain Score 4     Pain Location Back    Pain Orientation Right;Left    Pain Descriptors / Indicators Aching    Pain Type Chronic pain                               [facility] Adult PT Treatment/Exercise - 05/15/21 0001       Self-Care   Self-Care Other Self-Care Comments    Other Self-Care Comments  edu and review of TENS unit, application settings and duration for at home      Lumbar Exercises: Machines for  Strengthening   Leg Press 10lb, 15lb 10 reps    Other Lumbar Machine Exercise lat pull seated 2x10, 20lb      Lumbar Exercises: Standing   Other Standing Lumbar Exercises lat pull with march using TRX 10 reps                       PT Short Term Goals - 03/08/21 1106       PT SHORT TERM GOAL #1   Title Independent with initial HEP    Time 2    Period Weeks    Status Achieved    Target Date 03/13/21               PT Long Term Goals - 05/02/21 1038       PT LONG TERM GOAL #1   Title Independent with progressed HEP to improve outcomes    Time 6    Period Weeks    Status On-going    Target Date 05/22/21      PT LONG TERM GOAL #2   Title Patient  will demonstrate full pain free cervical AROM for safety with driving and ADLs    Baseline slow gaurded movements, tightness throughout, increased pain with extension, frequent headaches.    Time 6    Period Weeks    Status On-going   improved but still notes that she turns her whole body when checking blind spots. 04/10/21- 70 rotation bil pain end range L, 60 flexion, pain end range, 45 extension, tightness anterior cervical muscles.   Target Date 05/22/21      PT LONG TERM GOAL #3   Title Pt. will report 75% improvement in frequency/intensity of neck and back pain.    Time 6    Period Weeks    Status Achieved   04/10/21- 80% overall improvement   Target Date 05/22/21      PT LONG TERM GOAL #4   Title Pt. will improve FOTO to at least 50 (lumbar) to demonstrate improved functional mobility    Baseline 39    Time 6    Period Weeks    Status On-going   04/10/21- 45   Target Date 05/22/21      PT LONG TERM GOAL #5   Title Pt. will be able to lift 25# without increased LBP.    Baseline avoids all lifting/pulling/pushing and overhead movements.    Time 6    Period Weeks    Status On-going   05/02/21- still having difficulty with lifting a pot full of water and laundry baskets   Target Date 05/22/21                    Plan - 05/15/21 1206     Clinical Impression Statement Most of the session focused on reviewing application and setup for home TENS unit. Answered all of her questions with regards to the home unit and demonstrated application. Afterwards we did general strengthening exercises to help keep moving to decrease back pain. No complaints with the exercises and we were able to progress weight on the machines no problem. Pt responded well.    Personal Factors and Comorbidities Comorbidity 3+;Time since onset of injury/illness/exacerbation;Finances    Comorbidities HIstory of hernia repair, migraines, orthostatic hypotension, chronic neck and back pain.    PT Frequency 2x / week    PT Duration 6 weeks    PT Treatment/Interventions ADLs/Self Care Home Management;Cryotherapy;Electrical Stimulation;Aquatic Therapy;Ultrasound;Moist Heat;Traction;Stair training;Functional mobility training;Therapeutic activities;Therapeutic exercise;Neuromuscular re-education;Passive range of motion;Dry needling;Taping;Spinal Manipulations;Joint Manipulations    PT Next Visit Plan aquatic therapy for aerobic activity, continue strengthening, introduce lifting with light resistance.    Consulted and Agree with Plan of Care Patient             Patient will benefit from skilled therapeutic intervention in order to improve the following deficits and impairments:  Decreased activity tolerance, Decreased endurance, Decreased range of motion, Decreased strength, Increased fascial restricitons, Impaired perceived functional ability, Improper body mechanics, Pain, Decreased mobility, Difficulty walking, Increased muscle spasms, Postural dysfunction  Visit Diagnosis: Cervicalgia  Chronic midline low back pain with bilateral sciatica  Muscle weakness (generalized)  Other muscle spasm  Difficulty in walking, not elsewhere classified     Problem List There are no problems to display for this  patient.   Darleene Cleaver, PTA 05/15/2021, 12:13 PM  [facility] [address]  [address] [place], [place], [address] Phone: [phone]   Fax:  [phone]  Name: Ileigh Mettler MRN: [id number] Date of Birth: 1965/12/01

## 2021-05-21 ENCOUNTER — Ambulatory Visit: Payer: Medicaid Other

## 2021-05-21 ENCOUNTER — Other Ambulatory Visit: Payer: Self-pay

## 2021-05-21 DIAGNOSIS — M542 Cervicalgia: Secondary | ICD-10-CM

## 2021-05-21 DIAGNOSIS — M5442 Lumbago with sciatica, left side: Secondary | ICD-10-CM

## 2021-05-21 DIAGNOSIS — G8929 Other chronic pain: Secondary | ICD-10-CM

## 2021-05-21 DIAGNOSIS — M62838 Other muscle spasm: Secondary | ICD-10-CM

## 2021-05-21 DIAGNOSIS — M6281 Muscle weakness (generalized): Secondary | ICD-10-CM

## 2021-05-21 DIAGNOSIS — R262 Difficulty in walking, not elsewhere classified: Secondary | ICD-10-CM

## 2021-05-21 NOTE — Therapy (Signed)
[facility] [address]  [place], [place], [address] Phone: [phone]   Fax:  [phone]  Physical Therapy Treatment  Patient Details  Name: Carla Hines MRN: [id number] Date of Birth: Dec 30, 1965 Referring Provider (PT): Normajean Glasgow, MD   Encounter Date: 05/21/2021   PT End of Session - 05/21/21 1457     Visit Number 16    Number of Visits 20    Date for PT Re-Evaluation 05/22/21    Authorization Type [employer]    Authorization Time Period first 12 visit don't require prior auth    Authorization - Visit Number 2    Authorization - Number of Visits 12    PT Start Time 1405    PT Stop Time 1445    PT Time Calculation (min) 40 min    Activity Tolerance Patient tolerated treatment well;Patient limited by pain    Behavior During Therapy [facility] for tasks assessed/performed             Past Medical History:  Diagnosis Date   Acid reflux    Migraines     Past Surgical History:  Procedure Laterality Date   CERVIX SURGERY     HERNIA REPAIR      There were no vitals filed for this visit.   Subjective Assessment - 05/21/21 1406     Subjective Pt reports that her pain has improved, no higher than a 4/10 lately, has not had a chance to use her TENS unit.    Pertinent History chronic neck and back pain, orthostatic hypotension    Diagnostic tests X-rays- unavailable for review.  Reports "leaking" disc in back and bulging disc in neck    Patient Stated Goals being able to function more and longer period of time    Currently in Pain? Yes    Pain Score 4     Pain Location Back    Pain Orientation Right;Left    Pain Descriptors / Indicators Aching    Pain Type Chronic pain    Pain Score 4    Pain Location Neck    Pain Orientation Right;Left    Pain Descriptors / Indicators Aching    Pain Type Chronic pain                               [facility] Adult PT Treatment/Exercise -  05/21/21 0001       Exercises   Exercises Lumbar;Knee/Hip      Lumbar Exercises: Aerobic   Tread Mill no incline, 1.0 mph 6 min    Nustep L6x98min      Lumbar Exercises: Machines for Strengthening   Cybex Knee Extension B LE 10lb 2x10    Cybex Knee Flexion B LE 15 lb 2x10    Other Lumbar Machine Exercise cybex row 10 reps 25lb      Lumbar Exercises: Standing   Other Standing Lumbar Exercises pallof press and CW circles with red TB 20 reps    Other Standing Lumbar Exercises trunk rotations with red TB 10x      Knee/Hip Exercises: Standing   Hip Abduction Stengthening;Both;10 reps;Knee straight    Abduction Limitations RTB    Hip Extension Stengthening;Both;10 reps;Knee straight    Extension Limitations RTB      Knee/Hip Exercises: Seated   Clamshell with TheraBand Red   20x3 sec hold  PT Short Term Goals - 03/08/21 1106       PT SHORT TERM GOAL #1   Title Independent with initial HEP    Time 2    Period Weeks    Status Achieved    Target Date 03/13/21               PT Long Term Goals - 05/02/21 1038       PT LONG TERM GOAL #1   Title Independent with progressed HEP to improve outcomes    Time 6    Period Weeks    Status On-going    Target Date 05/22/21      PT LONG TERM GOAL #2   Title Patient will demonstrate full pain free cervical AROM for safety with driving and ADLs    Baseline slow gaurded movements, tightness throughout, increased pain with extension, frequent headaches.    Time 6    Period Weeks    Status On-going   improved but still notes that she turns her whole body when checking blind spots. 04/10/21- 70 rotation bil pain end range L, 60 flexion, pain end range, 45 extension, tightness anterior cervical muscles.   Target Date 05/22/21      PT LONG TERM GOAL #3   Title Pt. will report 75% improvement in frequency/intensity of neck and back pain.    Time 6    Period Weeks    Status Achieved   04/10/21- 80%  overall improvement   Target Date 05/22/21      PT LONG TERM GOAL #4   Title Pt. will improve FOTO to at least 50 (lumbar) to demonstrate improved functional mobility    Baseline 39    Time 6    Period Weeks    Status On-going   04/10/21- 45   Target Date 05/22/21      PT LONG TERM GOAL #5   Title Pt. will be able to lift 25# without increased LBP.    Baseline avoids all lifting/pulling/pushing and overhead movements.    Time 6    Period Weeks    Status On-going   05/02/21- still having difficulty with lifting a pot full of water and laundry baskets   Target Date 05/22/21                   Plan - 05/21/21 1457     Clinical Impression Statement Pt did fine with the exercises today. Her biggest challenge is that she struggles with walking w/o limitations from hip or back pain. Encouraged her to do walking in 5-10 min intervals and to take breaks when needed. All interventions focused on increasing lower back stability and improving strength to increase the patients walking tolerance.    Personal Factors and Comorbidities Comorbidity 3+;Time since onset of injury/illness/exacerbation;Finances    Comorbidities HIstory of hernia repair, migraines, orthostatic hypotension, chronic neck and back pain.    PT Frequency 2x / week    PT Duration 6 weeks    PT Treatment/Interventions ADLs/Self Care Home Management;Cryotherapy;Electrical Stimulation;Aquatic Therapy;Ultrasound;Moist Heat;Traction;Stair training;Functional mobility training;Therapeutic activities;Therapeutic exercise;Neuromuscular re-education;Passive range of motion;Dry needling;Taping;Spinal Manipulations;Joint Manipulations    PT Next Visit Plan aquatic therapy for aerobic activity, continue strengthening, introduce lifting with light resistance.    Consulted and Agree with Plan of Care Patient             Patient will benefit from skilled therapeutic intervention in order to improve the following deficits and  impairments:  Decreased activity tolerance, Decreased endurance,  Decreased range of motion, Decreased strength, Increased fascial restricitons, Impaired perceived functional ability, Improper body mechanics, Pain, Decreased mobility, Difficulty walking, Increased muscle spasms, Postural dysfunction  Visit Diagnosis: Cervicalgia  Chronic midline low back pain with bilateral sciatica  Muscle weakness (generalized)  Other muscle spasm  Difficulty in walking, not elsewhere classified     Problem List There are no problems to display for this patient.   Artist Pais, PTA 05/21/2021, 3:08 PM  [facility] [address]  [place], [place], [address] Phone: [phone]   Fax:  [phone]  Name: Carla Hines MRN: [id number] Date of Birth: 03/12/1966

## 2021-05-23 ENCOUNTER — Ambulatory Visit: Payer: Medicaid Other | Admitting: Physical Therapy

## 2021-05-23 ENCOUNTER — Other Ambulatory Visit: Payer: Self-pay

## 2021-05-23 ENCOUNTER — Encounter: Payer: Self-pay | Admitting: Physical Therapy

## 2021-05-23 DIAGNOSIS — M62838 Other muscle spasm: Secondary | ICD-10-CM

## 2021-05-23 DIAGNOSIS — M542 Cervicalgia: Secondary | ICD-10-CM

## 2021-05-23 DIAGNOSIS — M6281 Muscle weakness (generalized): Secondary | ICD-10-CM

## 2021-05-23 DIAGNOSIS — R262 Difficulty in walking, not elsewhere classified: Secondary | ICD-10-CM

## 2021-05-23 DIAGNOSIS — M5441 Lumbago with sciatica, right side: Secondary | ICD-10-CM

## 2021-05-23 DIAGNOSIS — G8929 Other chronic pain: Secondary | ICD-10-CM

## 2021-05-23 NOTE — Therapy (Signed)
[facility] [address]  [place], [place], [address] Phone: [phone]   Fax:  [phone]  Physical Therapy Treatment  Patient Details  Name: Carla Hines MRN: [id number] Date of Birth: 08/07/1965 Referring Provider (PT): Normajean Glasgow, MD   Encounter Date: 05/23/2021   PT End of Session - 05/23/21 0932     Visit Number 17    Number of Visits 26    Date for PT Re-Evaluation 06/27/21    Authorization Type [employer]    Authorization Time Period first 12 visit don't require prior auth    Authorization - Visit Number 3    Authorization - Number of Visits 12    Progress Note Due on Visit 10    PT Start Time 0930    PT Stop Time 1015    PT Time Calculation (min) 45 min    Activity Tolerance Patient tolerated treatment well;Patient limited by pain    Behavior During Therapy [facility] for tasks assessed/performed             Past Medical History:  Diagnosis Date   Acid reflux    Migraines     Past Surgical History:  Procedure Laterality Date   CERVIX SURGERY     HERNIA REPAIR      There were no vitals filed for this visit.   Subjective Assessment - 05/23/21 0933     Subjective Back is much better today 1/10; My neck is still hurting for some reason 4/10.    Pertinent History chronic neck and back pain, orthostatic hypotension    Patient Stated Goals being able to function more and longer period of time    Currently in Pain? Yes    Pain Score 1     Pain Location Back    Pain Orientation Right;Left    Pain Descriptors / Indicators Aching    Pain Type Chronic pain    Pain Score 4    Pain Location Neck    Pain Orientation Right;Left    Pain Descriptors / Indicators Aching    Pain Type Chronic pain                [facility] PT Assessment - 05/23/21 0001       AROM   Cervical - Right Side Bend 20    Cervical - Left Side Bend 26    Cervical - Right Rotation 66    Cervical - Left Rotation  66                           [facility] Adult PT Treatment/Exercise - 05/23/21 0001       Neck Exercises: Stretches   Upper Trapezius Stretch Right;Left;2 reps;30 seconds    Levator Stretch Right;Left;2 reps;30 seconds      Lumbar Exercises: Aerobic   Nustep L6x77mn      Lumbar Exercises: Machines for Strengthening   Cybex Knee Extension B LE 15 lb 2x10    Cybex Knee Flexion B LE 15 lb 2x10    Other Lumbar Machine Exercise cybex row 2x10 reps 20 lb      Lumbar Exercises: Standing   Functional Squats Limitations 8 reps then pt reported feeling sick to stomach    Other Standing Lumbar Exercises dead lifts x 10 no wt; then x 10 with 5# each hand  PT Education - 05/23/21 1008     Education Details levator stretch added    Person(s) Educated Patient    Methods Explanation;Demonstration;Handout    Comprehension Verbalized understanding;Returned demonstration              PT Short Term Goals - 03/08/21 1106       PT SHORT TERM GOAL #1   Title Independent with initial HEP    Time 2    Period Weeks    Status Achieved    Target Date 03/13/21               PT Long Term Goals - 05/23/21 0935       PT LONG TERM GOAL #1   Title Independent with progressed HEP to improve outcomes    Status Partially Met      PT LONG TERM GOAL #2   Title Patient will demonstrate full pain free cervical AROM for safety with driving and ADLs    Baseline full passive motion but still tight in suboccipitals and levator scapulae restricting full active motion.    Status On-going      PT LONG TERM GOAL #3   Title Pt. will report 75% improvement in frequency/intensity of neck and back pain.    Baseline Average pain for neck and back ix 4/10    Status Achieved      PT LONG TERM GOAL #4   Title Pt. will improve FOTO to at least 50 (lumbar) to demonstrate improved functional mobility    Status On-going      PT LONG TERM GOAL #5   Title Pt.  will be able to lift 25# without increased LBP.    Baseline can lift laundry basket without difficulty (about 10#) feels pain in both neck and back    Status On-going                   Plan - 05/23/21 1021     Clinical Impression Statement Patient presents with decreased low back pain today at 1/10. Her neck is hurting more at 4/10. In general patient reporting average neck and back pain at 4/10. She demonstrates full functional passive neck ROM, but is limited with active motion by tightness which still affecting driving. She reports pain with lifting over 10# in both the neck and back. We worked on more functional strengthening today with dead lifts and squats which she demonstrated good form with. She will benefit from continued skilled PT to address the above deficits and to meet remaining LTGs. She begins aquatic PT next week. She reported feeling nauseous midway through treatment so short breaks were taken. She attributed this to meds. Very tight in subocciptals and levator scap. She would benefit from DN/manual to these areas.    Personal Factors and Comorbidities Comorbidity 3+;Time since onset of injury/illness/exacerbation;Finances    Comorbidities HIstory of hernia repair, migraines, orthostatic hypotension, chronic neck and back pain.    Examination-Activity Limitations Bend;Carry;Lift;Locomotion Level;Sit;Stand;Stairs;Reach Overhead    Examination-Participation Restrictions Driving;Yard Work;Community Activity;Occupation;Cleaning    PT Frequency 2x / week    PT Duration Other (comment)   5 weeks   PT Treatment/Interventions ADLs/Self Care Home Management;Cryotherapy;Electrical Stimulation;Aquatic Therapy;Ultrasound;Moist Heat;Traction;Stair training;Functional mobility training;Therapeutic activities;Therapeutic exercise;Neuromuscular re-education;Passive range of motion;Dry needling;Taping;Spinal Manipulations;Joint Manipulations    PT Next Visit Plan aquatic therapy for  aerobic activity, DN/manual to suboccipitals/LS, continue strengthening, progress dead lifts and squats with heavier weight as tolerated; add farmer carries    PT Home Exercise Plan [email or web address]  Consulted and Agree with Plan of Care Patient             Patient will benefit from skilled therapeutic intervention in order to improve the following deficits and impairments:  Decreased activity tolerance, Decreased endurance, Decreased range of motion, Decreased strength, Increased fascial restricitons, Impaired perceived functional ability, Improper body mechanics, Pain, Decreased mobility, Difficulty walking, Increased muscle spasms, Postural dysfunction  Visit Diagnosis: Cervicalgia  Chronic midline low back pain with bilateral sciatica  Muscle weakness (generalized)  Other muscle spasm  Difficulty in walking, not elsewhere classified     Problem List There are no problems to display for this patient.  Madelyn Flavors, PT 05/23/2021, 11:26 AM  [facility] [address]  [place], [place], [address] Phone: [phone]   Fax:  [phone]  Name: Carla Hines MRN: [id number] Date of Birth: [birth year]

## 2021-05-23 NOTE — Patient Instructions (Addendum)
Access Code: [email or web address] URL: [email or web address] Date: 05/23/2021 Prepared by: Raynelle Fanning  Exercises Seated Piriformis Stretch - 1 x daily - 7 x weekly - 3 sets - 3 reps - 15-30 seconds hold Seated Figure 4 Piriformis Stretch - 1 x daily - 7 x weekly - 3 sets - 3 reps - 15-30 seconds hold Seated Pelvic Tilt - 1 x daily - 7 x weekly - 3 sets - 10 reps - 3 second hold Seated Scapular Retraction - 1 x daily - 7 x weekly - 3 sets - 10 reps Supine Lower Trunk Rotation - 1 x daily - 7 x weekly - 2 sets - 10 reps Supine Double Knee to Chest Modified - 1 x daily - 7 x weekly - 2 sets - 10 reps - 15 sec hold Supine Posterior Pelvic Tilt - 1 x daily - 7 x weekly - 2 sets - 10 reps Supine Hip Adduction Isometric with Ball - 1 x daily - 7 x weekly - 2 sets - 10 reps - 5 sec hold Supine Transversus Abdominis Bracing with Double Leg Fallout - 1 x daily - 7 x weekly - 2 sets - 10 reps Supine Chin Tuck - 1 x daily - 7 x weekly - 2 sets - 10 reps - 5 sec hold Supine Suboccipital Release with Tennis Balls - 1 x daily - 7 x weekly - 1 sets - 1 reps - 1 min hold Clamshell with Resistance - 1 x daily - 7 x weekly - 2 sets - 10 reps - 3-5 second hold Supine Bridge with Resistance Band - 1 x daily - 7 x weekly - 2 sets - 10 reps - 3-5 second hold Seated Levator Scapulae Stretch - 2 x daily - 7 x weekly - 3 reps - 30 hold

## 2021-05-27 ENCOUNTER — Ambulatory Visit (HOSPITAL_BASED_OUTPATIENT_CLINIC_OR_DEPARTMENT_OTHER): Payer: Medicaid Other | Attending: Physical Medicine and Rehabilitation | Admitting: Physical Therapy

## 2021-05-27 DIAGNOSIS — M542 Cervicalgia: Secondary | ICD-10-CM | POA: Insufficient documentation

## 2021-05-27 DIAGNOSIS — M545 Low back pain, unspecified: Secondary | ICD-10-CM | POA: Insufficient documentation

## 2021-05-30 ENCOUNTER — Ambulatory Visit: Payer: Medicaid Other

## 2021-05-30 ENCOUNTER — Other Ambulatory Visit: Payer: Self-pay

## 2021-05-30 DIAGNOSIS — M62838 Other muscle spasm: Secondary | ICD-10-CM

## 2021-05-30 DIAGNOSIS — M5442 Lumbago with sciatica, left side: Secondary | ICD-10-CM

## 2021-05-30 DIAGNOSIS — R262 Difficulty in walking, not elsewhere classified: Secondary | ICD-10-CM

## 2021-05-30 DIAGNOSIS — G8929 Other chronic pain: Secondary | ICD-10-CM

## 2021-05-30 DIAGNOSIS — M542 Cervicalgia: Secondary | ICD-10-CM

## 2021-05-30 DIAGNOSIS — M6281 Muscle weakness (generalized): Secondary | ICD-10-CM

## 2021-05-30 NOTE — Therapy (Signed)
[facility] [address]  [place], [place], [address] Phone: [phone]   Fax:  [phone]  Physical Therapy Treatment  Patient Details  Name: Carla Hines MRN: [id number] Date of Birth: 05/25/1965 Referring Provider (PT): Normajean Glasgow, MD   Encounter Date: 05/30/2021   PT End of Session - 05/30/21 1028     Visit Number 18    Number of Visits 26    Date for PT Re-Evaluation 06/27/21    Authorization Type [employer]    Authorization Time Period first 12 visit don't require prior auth    Authorization - Visit Number 4    Authorization - Number of Visits 12    Progress Note Due on Visit 10    PT Start Time 0940    PT Stop Time 1023    PT Time Calculation (min) 43 min    Activity Tolerance Patient tolerated treatment well    Behavior During Therapy WFL for tasks assessed/performed             Past Medical History:  Diagnosis Date   Acid reflux    Migraines     Past Surgical History:  Procedure Laterality Date   CERVIX SURGERY     HERNIA REPAIR      There were no vitals filed for this visit.   Subjective Assessment - 05/30/21 0940     Subjective Over the weekend, went camping helped take down some tent and I hurt since Sunday. The last 2 weeks have been rough as far as pain, I feel like I was doing too much squatting and stooping last session.    Pertinent History chronic neck and back pain, orthostatic hypotension    Diagnostic tests X-rays- unavailable for review.  Reports "leaking" disc in back and bulging disc in neck    Patient Stated Goals being able to function more and longer period of time    Currently in Pain? Yes    Pain Score 6     Pain Location Back    Pain Orientation Right;Left    Pain Descriptors / Indicators Aching    Pain Type Chronic pain    Pain Score 6    Pain Location Neck    Pain Orientation Right;Left    Pain Descriptors / Indicators Aching    Pain Type  Chronic pain                               [facility] Adult PT Treatment/Exercise - 05/30/21 0001       Neck Exercises: Stretches   Upper Trapezius Stretch Right;2 reps;30 seconds    Levator Stretch Right;2 reps;30 seconds      Lumbar Exercises: Aerobic   Tread Mill no incline, 1.0 mph, 6 min      Lumbar Exercises: Machines for Strengthening   Other Lumbar Machine Exercise cybex press 10lb 15 reps    Other Lumbar Machine Exercise lat pull 25lb 2x10      Lumbar Exercises: Standing   Heel Raises 10 reps    Heel Raises Limitations UE support    Other Standing Lumbar Exercises farmer carries with 7lb 3x around clinic, 57f each lap    Other Standing Lumbar Exercises bird dog with 1 hand support 10x each; deadlifts to treadmill floor with blue weight ball 15 reps      Lumbar Exercises: Seated   Other Seated Lumbar Exercises flexion stretch with green  Pball 4x10"      Manual Therapy   Manual Therapy Soft tissue mobilization;Myofascial release    Soft tissue mobilization to B subocipitals, UT, LS    Myofascial Release suboccipital release and TPR to R LS, UT                       PT Short Term Goals - 03/08/21 1106       PT SHORT TERM GOAL #1   Title Independent with initial HEP    Time 2    Period Weeks    Status Achieved    Target Date 03/13/21               PT Long Term Goals - 05/23/21 0935       PT LONG TERM GOAL #1   Title Independent with progressed HEP to improve outcomes    Status Partially Met      PT LONG TERM GOAL #2   Title Patient will demonstrate full pain free cervical AROM for safety with driving and ADLs    Baseline full passive motion but still tight in suboccipitals and levator scapulae restricting full active motion.    Status On-going      PT LONG TERM GOAL #3   Title Pt. will report 75% improvement in frequency/intensity of neck and back pain.    Baseline Average pain for neck and back ix 4/10    Status  Achieved      PT LONG TERM GOAL #4   Title Pt. will improve FOTO to at least 50 (lumbar) to demonstrate improved functional mobility    Status On-going      PT LONG TERM GOAL #5   Title Pt. will be able to lift 25# without increased LBP.    Baseline can lift laundry basket without difficulty (about 10#) feels pain in both neck and back    Status On-going                   Plan - 05/30/21 1029     Clinical Impression Statement Pt reported to clinic noting an increase in pain over the past couple of weeks but today noting more pain after going on a camping trip with her children. She did report that she did a lot more lifting and activities that she is not used to, I assured her this is probably a cause of the increase in pain. She also reported a migraine but after manual this was resolved. Pt well tolerated the exercises today, although limited by some fatigue with the chest press and farmer carries. Assured her to rest as needed but to keep up with light activity to avoid stiffness. Pt responded well.    Personal Factors and Comorbidities Comorbidity 3+;Time since onset of injury/illness/exacerbation;Finances    Comorbidities HIstory of hernia repair, migraines, orthostatic hypotension, chronic neck and back pain.    PT Frequency 2x / week    PT Duration Other (comment)   5 weeks   PT Treatment/Interventions ADLs/Self Care Home Management;Cryotherapy;Electrical Stimulation;Aquatic Therapy;Ultrasound;Moist Heat;Traction;Stair training;Functional mobility training;Therapeutic activities;Therapeutic exercise;Neuromuscular re-education;Passive range of motion;Dry needling;Taping;Spinal Manipulations;Joint Manipulations    PT Next Visit Plan aquatic therapy for aerobic activity, DN/manual to suboccipitals/LS, continue strengthening, progress dead lifts and squats with heavier weight as tolerated; add farmer carries    PT Home Exercise Plan [id number]    Consulted and Agree with Plan of Care  Patient  Patient will benefit from skilled therapeutic intervention in order to improve the following deficits and impairments:  Decreased activity tolerance, Decreased endurance, Decreased range of motion, Decreased strength, Increased fascial restricitons, Impaired perceived functional ability, Improper body mechanics, Pain, Decreased mobility, Difficulty walking, Increased muscle spasms, Postural dysfunction  Visit Diagnosis: Cervicalgia  Chronic midline low back pain with bilateral sciatica  Muscle weakness (generalized)  Other muscle spasm  Difficulty in walking, not elsewhere classified     Problem List There are no problems to display for this patient.   Artist Pais, PTA 05/30/2021, 10:49 AM  [facility] [address]  [place], [place], [address] Phone: [phone]   Fax:  [phone]  Name: Carla Hines MRN: [id number] Date of Birth: 03/21/1966

## 2021-06-03 ENCOUNTER — Encounter (HOSPITAL_BASED_OUTPATIENT_CLINIC_OR_DEPARTMENT_OTHER): Payer: Self-pay | Admitting: Physical Therapy

## 2021-06-03 ENCOUNTER — Other Ambulatory Visit: Payer: Self-pay

## 2021-06-03 ENCOUNTER — Ambulatory Visit (HOSPITAL_BASED_OUTPATIENT_CLINIC_OR_DEPARTMENT_OTHER): Payer: Medicaid Other | Admitting: Physical Therapy

## 2021-06-03 DIAGNOSIS — M6281 Muscle weakness (generalized): Secondary | ICD-10-CM

## 2021-06-03 DIAGNOSIS — R262 Difficulty in walking, not elsewhere classified: Secondary | ICD-10-CM

## 2021-06-03 DIAGNOSIS — M545 Low back pain, unspecified: Secondary | ICD-10-CM | POA: Diagnosis present

## 2021-06-03 DIAGNOSIS — M542 Cervicalgia: Secondary | ICD-10-CM | POA: Diagnosis present

## 2021-06-03 DIAGNOSIS — G8929 Other chronic pain: Secondary | ICD-10-CM

## 2021-06-03 DIAGNOSIS — M5441 Lumbago with sciatica, right side: Secondary | ICD-10-CM

## 2021-06-03 DIAGNOSIS — M62838 Other muscle spasm: Secondary | ICD-10-CM

## 2021-06-03 NOTE — Therapy (Signed)
[facility] [place], [place], [address] Phone: [phone]   Fax:  [phone]  Physical Therapy Treatment  Patient Details  Name: Carla Hines MRN: [id number] Date of Birth: [birth year] Referring Provider (PT): Normajean Glasgow, MD   Encounter Date: 06/03/2021   PT End of Session - 06/03/21 0949     Visit Number 19    Number of Visits 26    Date for PT Re-Evaluation 06/27/21    Authorization Type [employer]    Authorization Time Period first 12 visit don't require prior auth    Authorization - Visit Number 4    Authorization - Number of Visits 12    Progress Note Due on Visit 10    PT Start Time [phone]    PT Stop Time 1030    PT Time Calculation (min) 41 min    Activity Tolerance Patient tolerated treatment well    Behavior During Therapy [facility] for tasks assessed/performed             Past Medical History:  Diagnosis Date   Acid reflux    Migraines     Past Surgical History:  Procedure Laterality Date   CERVIX SURGERY     HERNIA REPAIR      There were no vitals filed for this visit.   Subjective Assessment - 06/03/21 0954     Subjective "Pain is better; I can't swim"    Currently in Pain? Yes    Pain Score 3     Pain Location Back    Pain Orientation Right;Left    Pain Descriptors / Indicators Aching    Pain Type Chronic pain    Pain Frequency Constant    Pain Score 3    Pain Location Neck    Pain Orientation Right;Left    Pain Descriptors / Indicators Aching    Pain Frequency Constant            Pt seen for aquatic therapy today.  Treatment took place in water 3.25-4.8 ft in depth at the [employer] pool. Temp of water was 91.  Pt entered/exited the pool via stairs (step through pattern) independently with bilat rail.  Reviewed current function, pain levels, response to prior Rx, and HEP compliance.   Introduced pt to setting. Walking in all depths  Walking  forward, backward and sidestepping 4 widths each while edu on benefits of aquatic therapy and properties of water -added 1 foam buoy submerged x 4 widths for added core engagement  Exercise -kick board push down 3x15. With each set added instruction for abdominal hollowing then glut tightness for increased core engagement -lumbar rotations 2x10 -hip hinges 2x10. Vc and demonstration for proper execution  Cervical spine ROM/stretching submerged in warm water. Manual stretching into shoulder depression. Cervical retraction, then with rotation and side bending.  Pt requires buoyancy for support and to offload joints with strengthening exercises. Viscosity of the water is needed for resistance of strengthening; water current perturbations provides challenge to standing balance unsupported, requiring increased core activation.                            PT Education - 06/03/21 0956     Education Details Properties of water, benefits of aquatic    Person(s) Educated Patient    Methods Explanation    Comprehension Verbalized understanding              PT Short Term Goals -  03/08/21 1106       PT SHORT TERM GOAL #1   Title Independent with initial HEP    Time 2    Period Weeks    Status Achieved    Target Date 03/13/21               PT Long Term Goals - 05/23/21 0935       PT LONG TERM GOAL #1   Title Independent with progressed HEP to improve outcomes    Status Partially Met      PT LONG TERM GOAL #2   Title Patient will demonstrate full pain free cervical AROM for safety with driving and ADLs    Baseline full passive motion but still tight in suboccipitals and levator scapulae restricting full active motion.    Status On-going      PT LONG TERM GOAL #3   Title Pt. will report 75% improvement in frequency/intensity of neck and back pain.    Baseline Average pain for neck and back ix 4/10    Status Achieved      PT LONG TERM GOAL #4   Title  Pt. will improve FOTO to at least 50 (lumbar) to demonstrate improved functional mobility    Status On-going      PT LONG TERM GOAL #5   Title Pt. will be able to lift 25# without increased LBP.    Baseline can lift laundry basket without difficulty (about 10#) feels pain in both neck and back    Status On-going                   Plan - 06/03/21 1035     Clinical Impression Statement Pt introduced to setting.  She demonstrates slight apprehension which decreases as session progresses.  She is directed through stretching and strengthening exercises which she tolerates fair. Some increase in cervial and shoulder pain with LE exercises submerged 50%. Progressed pt to warm water and submerged 90%.  Stretching/ROM and gentle manual overpressure/deep point massage for improved range and decreased pain/muscle tightness. Will progress with aerobic capacity challenges going forward.    Stability/Clinical Decision Making Evolving/Moderate complexity    Rehab Potential Good    PT Frequency 2x / week    PT Duration Other (comment)    PT Treatment/Interventions ADLs/Self Care Home Management;Cryotherapy;Electrical Stimulation;Aquatic Therapy;Ultrasound;Moist Heat;Traction;Stair training;Functional mobility training;Therapeutic activities;Therapeutic exercise;Neuromuscular re-education;Passive range of motion;Dry needling;Taping;Spinal Manipulations;Joint Manipulations    PT Next Visit Plan aquatic therapy for aerobic activity, DN/manual to suboccipitals/LS, continue strengthening, progress dead lifts and squats with heavier weight as tolerated; add farmer carries    PT Home Exercise Plan [id number]             Patient will benefit from skilled therapeutic intervention in order to improve the following deficits and impairments:  Decreased activity tolerance, Decreased endurance, Decreased range of motion, Decreased strength, Increased fascial restricitons, Impaired perceived functional ability,  Improper body mechanics, Pain, Decreased mobility, Difficulty walking, Increased muscle spasms, Postural dysfunction  Visit Diagnosis: Cervicalgia  Chronic midline low back pain with bilateral sciatica  Muscle weakness (generalized)  Other muscle spasm  Difficulty in walking, not elsewhere classified     Problem List There are no problems to display for this patient.   Vedia Pereyra, PT 06/03/2021, 1:42 PM  [facility] [address] [place], [place], [address] Phone: [phone]   Fax:  [phone]  Name: Carla Hines MRN: [id number] Date of Birth: 07/08/1965

## 2021-06-05 ENCOUNTER — Ambulatory Visit: Payer: Medicaid Other | Attending: Physical Medicine and Rehabilitation | Admitting: Physical Therapy

## 2021-06-05 ENCOUNTER — Other Ambulatory Visit: Payer: Self-pay

## 2021-06-05 ENCOUNTER — Encounter: Payer: Self-pay | Admitting: Physical Therapy

## 2021-06-05 DIAGNOSIS — M5441 Lumbago with sciatica, right side: Secondary | ICD-10-CM | POA: Diagnosis present

## 2021-06-05 DIAGNOSIS — G8929 Other chronic pain: Secondary | ICD-10-CM | POA: Insufficient documentation

## 2021-06-05 DIAGNOSIS — M6281 Muscle weakness (generalized): Secondary | ICD-10-CM | POA: Diagnosis present

## 2021-06-05 DIAGNOSIS — M542 Cervicalgia: Secondary | ICD-10-CM | POA: Insufficient documentation

## 2021-06-05 DIAGNOSIS — M5442 Lumbago with sciatica, left side: Secondary | ICD-10-CM | POA: Diagnosis present

## 2021-06-05 DIAGNOSIS — R262 Difficulty in walking, not elsewhere classified: Secondary | ICD-10-CM | POA: Diagnosis present

## 2021-06-05 DIAGNOSIS — M62838 Other muscle spasm: Secondary | ICD-10-CM | POA: Insufficient documentation

## 2021-06-05 NOTE — Therapy (Signed)
[facility] [address]  [place], [place], [address] Phone: [phone]   Fax:  [phone]  Physical Therapy Treatment  Patient Details  Name: Carla Hines MRN: [id number] Date of Birth: Feb 17, 1966 Referring Provider (PT): Normajean Glasgow, MD   Encounter Date: 06/05/2021   PT End of Session - 06/05/21 0847     Visit Number 20    Number of Visits 26    Date for PT Re-Evaluation 06/27/21    Authorization Type [employer]    Authorization - Visit Number 5    Authorization - Number of Visits 12    Progress Note Due on Visit 10    PT Start Time 0848    PT Stop Time 0928    PT Time Calculation (min) 40 min    Activity Tolerance Patient tolerated treatment well    Behavior During Therapy [facility] for tasks assessed/performed             Past Medical History:  Diagnosis Date   Acid reflux    Migraines     Past Surgical History:  Procedure Laterality Date   CERVIX SURGERY     HERNIA REPAIR      There were no vitals filed for this visit.   Subjective Assessment - 06/05/21 0851     Subjective Pt reports she had aquatic PT the other day and she really liked it, but her neck is hurting more now. No headache today.  She felt it really helped her back. She can't take her gabapentin until another Rx is ordered because it makes her sick to her stomach.    Pertinent History chronic neck and back pain, orthostatic hypotension    Patient Stated Goals being able to function more and longer period of time    Currently in Pain? Yes    Pain Score 1     Pain Location Back    Pain Orientation Right;Left    Pain Descriptors / Indicators Aching    Pain Score 6    Pain Location Neck    Pain Orientation Right;Left    Pain Descriptors / Indicators Aching    Pain Type Chronic pain                               [facility] Adult PT Treatment/Exercise - 06/05/21 0001       Neck Exercises: Standing    Other Standing Exercises shrugs 7# each hand x 10 some pain left at end      Neck Exercises: Seated   Neck Retraction 10 reps      Neck Exercises: Stretches   Upper Trapezius Stretch Right;2 reps;30 seconds      Lumbar Exercises: Aerobic   UBE (Upper Arm Bike) L1.5 13mn fwd/222m bwd      Lumbar Exercises: Machines for Strengthening   Other Lumbar Machine Exercise cybex press  15lb 2x10 reps    Other Lumbar Machine Exercise standing lat pull 20lb 2x10      Lumbar Exercises: Standing   Other Standing Lumbar Exercises farmer carries with 7lb 3x around clinic, 9055fach lap;   cues (prior to start) for shoulder retraction, keeping wt out from hips and ab set     Manual Therapy   Manual Therapy Soft tissue mobilization    Manual therapy comments Skilled palpation and monitoring of soft tissues during DN    Soft tissue mobilization to bil UT,LS, cerv  paraspinals and suboccipitals              Trigger Point Dry Needling - 06/05/21 0001     Consent Given? Yes    Education Handout Provided Previously provided    Muscles Treated Head and Neck Upper trapezius;Suboccipitals;Cervical multifidi    Dry Needling Comments bil    Upper Trapezius Response Twitch reponse elicited;Palpable increased muscle length    Suboccipitals Response Twitch response elicited;Palpable increased muscle length    Cervical multifidi Response Twitch reponse elicited;Palpable increased muscle length                     PT Short Term Goals - 03/08/21 1106       PT SHORT TERM GOAL #1   Title Independent with initial HEP    Time 2    Period Weeks    Status Achieved    Target Date 03/13/21               PT Long Term Goals - 05/23/21 0935       PT LONG TERM GOAL #1   Title Independent with progressed HEP to improve outcomes    Status Partially Met      PT LONG TERM GOAL #2   Title Patient will demonstrate full pain free cervical AROM for safety with driving and ADLs    Baseline full  passive motion but still tight in suboccipitals and levator scapulae restricting full active motion.    Status On-going      PT LONG TERM GOAL #3   Title Pt. will report 75% improvement in frequency/intensity of neck and back pain.    Baseline Average pain for neck and back ix 4/10    Status Achieved      PT LONG TERM GOAL #4   Title Pt. will improve FOTO to at least 50 (lumbar) to demonstrate improved functional mobility    Status On-going      PT LONG TERM GOAL #5   Title Pt. will be able to lift 25# without increased LBP.    Baseline can lift laundry basket without difficulty (about 10#) feels pain in both neck and back    Status On-going                   Plan - 06/05/21 1806     Clinical Impression Statement Norma reported good response in low back to aquatic PT, but she did have increased neck pain after. She tolerated some increase in resistance with TE today. Significant tightness and multiple TPs present in B UT,suboccipitals and cervical spine. DN/MT to B neck and UT was very effective with full rotation demonstrated after as well as decreased pain.    PT Frequency 2x / week    PT Duration Other (comment)    PT Treatment/Interventions ADLs/Self Care Home Management;Cryotherapy;Electrical Stimulation;Aquatic Therapy;Ultrasound;Moist Heat;Traction;Stair training;Functional mobility training;Therapeutic activities;Therapeutic exercise;Neuromuscular re-education;Passive range of motion;Dry needling;Taping;Spinal Manipulations;Joint Manipulations    PT Next Visit Plan aquatic therapy for aerobic activity, What are results from MRI/lumbar? aseess DN/manual to suboccipitals/LS, continue strengthening, progress dead lifts and squats with heavier weight as tolerated; add farmer carries             Patient will benefit from skilled therapeutic intervention in order to improve the following deficits and impairments:  Decreased activity tolerance, Decreased endurance, Decreased  range of motion, Decreased strength, Increased fascial restricitons, Impaired perceived functional ability, Improper body mechanics, Pain, Decreased mobility, Difficulty walking, Increased muscle spasms, Postural  dysfunction  Visit Diagnosis: Cervicalgia  Chronic midline low back pain with bilateral sciatica  Muscle weakness (generalized)  Other muscle spasm     Problem List There are no problems to display for this patient.  Madelyn Flavors, PT 06/05/2021, 6:09 PM  [facility] [address]  [place], [place], [address] Phone: [phone]   Fax:  [phone]  Name: Carla Hines MRN: [id number] Date of Birth: 1965-08-13

## 2021-06-10 ENCOUNTER — Encounter (HOSPITAL_BASED_OUTPATIENT_CLINIC_OR_DEPARTMENT_OTHER): Payer: Self-pay | Admitting: Physical Therapy

## 2021-06-10 ENCOUNTER — Ambulatory Visit (HOSPITAL_BASED_OUTPATIENT_CLINIC_OR_DEPARTMENT_OTHER): Payer: Medicaid Other | Attending: Physical Medicine and Rehabilitation | Admitting: Physical Therapy

## 2021-06-10 ENCOUNTER — Other Ambulatory Visit: Payer: Self-pay

## 2021-06-10 DIAGNOSIS — R262 Difficulty in walking, not elsewhere classified: Secondary | ICD-10-CM | POA: Insufficient documentation

## 2021-06-10 DIAGNOSIS — M6281 Muscle weakness (generalized): Secondary | ICD-10-CM | POA: Insufficient documentation

## 2021-06-10 DIAGNOSIS — M5442 Lumbago with sciatica, left side: Secondary | ICD-10-CM | POA: Insufficient documentation

## 2021-06-10 DIAGNOSIS — G8929 Other chronic pain: Secondary | ICD-10-CM | POA: Diagnosis present

## 2021-06-10 DIAGNOSIS — M542 Cervicalgia: Secondary | ICD-10-CM | POA: Insufficient documentation

## 2021-06-10 DIAGNOSIS — M5441 Lumbago with sciatica, right side: Secondary | ICD-10-CM | POA: Diagnosis present

## 2021-06-10 DIAGNOSIS — M62838 Other muscle spasm: Secondary | ICD-10-CM | POA: Insufficient documentation

## 2021-06-10 NOTE — Therapy (Signed)
[place], [place], [address] Phone: [phone]   Fax:  [phone]  Physical Therapy Treatment  Patient Details  Name: Carla Hines MRN: [id number] Date of Birth: Oct 02, 1965 Referring Provider (PT): Normajean Glasgow, MD   Encounter Date: 06/10/2021   PT End of Session - 06/10/21 0950     Visit Number 21    Number of Visits 26    Date for PT Re-Evaluation 06/27/21    Authorization Type [employer]    Authorization - Visit Number 5    Authorization - Number of Visits 12    Progress Note Due on Visit 10    PT Start Time 0945    PT Stop Time 1030    PT Time Calculation (min) 45 min    Activity Tolerance Patient tolerated treatment well    Behavior During Therapy [facility] for tasks assessed/performed             Past Medical History:  Diagnosis Date   Acid reflux    Migraines     Past Surgical History:  Procedure Laterality Date   CERVIX SURGERY     HERNIA REPAIR      There were no vitals filed for this visit.   Subjective Assessment - 06/10/21 0948     Subjective Pt reports DN on neck and shoulders last visit has helped tremendously.  2/10 pain in area today and 1/10 in lower back.  Is back on pregaba and takng zofran with it.    Pain Score 1     Pain Location Back    Pain Orientation Right;Left    Pain Descriptors / Indicators Aching    Pain Type Chronic pain    Pain Score 2    Pain Orientation Right;Left    Pain Descriptors / Indicators Aching    Pain Type Chronic pain             Pt seen for aquatic therapy today.  Treatment took place in water 3.25-4.8 ft in depth at the [facility] pool. Temp of water was 91.  Pt entered/exited the pool via stairs (step through pattern) independently with bilat rail.   Reviewed current function, pain levels, response to prior Rx, and HEP compliance.      Walking forward, backward and sidestepping 4 widths ea    Seated -stretching: gastroc, hamstring, add and glut/piriformis 2- 3x20 sec hold -flutter kicking at hip 3 x 25 reps; add/abd 3x20 (strengthening and aerobic capacity benefit)   Standing -hip hinges x8. Vc and demonstration for proper execution -kick board push down x15. With each set added instruction for abdominal hollowing then glut tightness for increased core engagement -lumbar rotations x20  Shoulder flex and add/abd using 1 foam hand buoy x5 -horizontal abduction unresisted x 10  Walking forward and back throughout session recovery. Few width with 1 foam han buoys submerged for added core engagement   Pt requires buoyancy for support and to offload joints with strengthening exercises. Viscosity of the water is needed for resistance of strengthening; water current perturbations provides challenge to standing balance unsupported, requiring increased core activation.                             PT Short Term Goals - 03/08/21 1106       PT SHORT TERM GOAL #1   Title Independent with initial HEP    Time 2    Period Weeks  Status Achieved    Target Date 03/13/21               PT Long Term Goals - 05/23/21 0935       PT LONG TERM GOAL #1   Title Independent with progressed HEP to improve outcomes    Status Partially Met      PT LONG TERM GOAL #2   Title Patient will demonstrate full pain free cervical AROM for safety with driving and ADLs    Baseline full passive motion but still tight in suboccipitals and levator scapulae restricting full active motion.    Status On-going      PT LONG TERM GOAL #3   Title Pt. will report 75% improvement in frequency/intensity of neck and back pain.    Baseline Average pain for neck and back ix 4/10    Status Achieved      PT LONG TERM GOAL #4   Title Pt. will improve FOTO to at least 50 (lumbar) to demonstrate improved functional mobility    Status On-going      PT LONG TERM GOAL #5   Title Pt.  will be able to lift 25# without increased LBP.    Baseline can lift laundry basket without difficulty (about 10#) feels pain in both neck and back    Status On-going                   Plan - 06/10/21 1012     Clinical Impression Statement Excellent response to last session on land.  Significant reduction in cervical and shoulder pain.  Advanced LE stretching in pool today  and begun aerobic capacity element, pt with some reported discomfort in right hip.  Added ue exercises with light resistence. Pt tolerates fair. Did modify core exercises due to complaints of rle discomofrt although upon completion of session pt reports further reduction in pain 1/10 for cevical though LB.    Stability/Clinical Decision Making Evolving/Moderate complexity    Clinical Decision Making Moderate    Rehab Potential Good    PT Frequency 2x / week    PT Duration Other (comment)    PT Treatment/Interventions ADLs/Self Care Home Management;Cryotherapy;Electrical Stimulation;Aquatic Therapy;Ultrasound;Moist Heat;Traction;Stair training;Functional mobility training;Therapeutic activities;Therapeutic exercise;Neuromuscular re-education;Passive range of motion;Dry needling;Taping;Spinal Manipulations;Joint Manipulations             Patient will benefit from skilled therapeutic intervention in order to improve the following deficits and impairments:  Decreased activity tolerance, Decreased endurance, Decreased range of motion, Decreased strength, Increased fascial restricitons, Impaired perceived functional ability, Improper body mechanics, Pain, Decreased mobility, Difficulty walking, Increased muscle spasms, Postural dysfunction  Visit Diagnosis: Cervicalgia  Chronic midline low back pain with bilateral sciatica  Muscle weakness (generalized)  Other muscle spasm  Difficulty in walking, not elsewhere classified     Problem List There are no problems to display for this patient.   Stanton Kidney  Tharon Aquas) Alberta Lenhard MPT 06/10/2021, 10:36 AM  [facility] [address] [place], [place], [address] Phone: [phone]   Fax:  [phone]  Name: Carla Hines MRN: [id number] Date of Birth: October 09, 1965

## 2021-06-12 ENCOUNTER — Other Ambulatory Visit: Payer: Self-pay

## 2021-06-12 ENCOUNTER — Ambulatory Visit: Payer: Medicaid Other

## 2021-06-12 DIAGNOSIS — M542 Cervicalgia: Secondary | ICD-10-CM

## 2021-06-12 DIAGNOSIS — G8929 Other chronic pain: Secondary | ICD-10-CM

## 2021-06-12 DIAGNOSIS — M62838 Other muscle spasm: Secondary | ICD-10-CM

## 2021-06-12 DIAGNOSIS — M6281 Muscle weakness (generalized): Secondary | ICD-10-CM

## 2021-06-12 DIAGNOSIS — R262 Difficulty in walking, not elsewhere classified: Secondary | ICD-10-CM

## 2021-06-12 DIAGNOSIS — M5442 Lumbago with sciatica, left side: Secondary | ICD-10-CM

## 2021-06-12 NOTE — Therapy (Signed)
[place] [address]  [place], [place], [address] Phone: [phone]   Fax:  [phone]  Physical Therapy Treatment  Patient Details  Name: Carla Hines MRN: [id number] Date of Birth: 1965/05/19 Referring Provider (PT): Normajean Glasgow, MD   Encounter Date: 06/12/2021   PT End of Session - 06/12/21 1055     Visit Number 22    Number of Visits 26    Date for PT Re-Evaluation 06/27/21    Authorization Type [employer]    Authorization - Visit Number 6    Authorization - Number of Visits 12    Progress Note Due on Visit 10    PT Start Time 1011    PT Stop Time 1107    PT Time Calculation (min) 56 min    Activity Tolerance Patient tolerated treatment well;Patient limited by pain    Behavior During Therapy [facility] for tasks assessed/performed             Past Medical History:  Diagnosis Date   Acid reflux    Migraines     Past Surgical History:  Procedure Laterality Date   CERVIX SURGERY     HERNIA REPAIR      There were no vitals filed for this visit.   Subjective Assessment - 06/12/21 1012     Subjective Pt reports that her MRI results came back, she has leaking disk in L4-L5. Aquatic therapy has been going well and she feels much better.    Pertinent History chronic neck and back pain, orthostatic hypotension    Diagnostic tests X-rays- unavailable for review.  Reports "leaking" disc in back and bulging disc in neck    Patient Stated Goals being able to function more and longer period of time    Currently in Pain? Yes    Pain Score 1     Pain Location Neck    Pain Orientation Right;Left    Pain Score 3    Pain Location Back    Pain Orientation Right;Left                               [facility] Adult PT Treatment/Exercise - 06/12/21 0001       Neck Exercises: Theraband   Horizontal ABduction 20 reps;Green    Horizontal ABduction Limitations 2x10      Neck Exercises:  Stretches   Other Neck Stretches rhomboids stretch x 30 sec      Lumbar Exercises: Stretches   Figure 4 Stretch 30 seconds;Seated;With overpressure   pain with L leg so discontinued   Other Lumbar Stretch Exercise Bil standing plank on the counter with thoracic rotation 5x5"    Other Lumbar Stretch Exercise 3 way seated flexion stretch with green pball x30 sec each      Lumbar Exercises: Aerobic   Tread Mill 0.5 incline, 1.0 mph 6 min      Lumbar Exercises: Machines for Strengthening   Other Lumbar Machine Exercise cybex row unilateral R/L with thoracic rotation 2x10, 1 set 5lb, 1 set 8lb    Other Lumbar Machine Exercise pallof press 5lb 15 reps R/L      Lumbar Exercises: Standing   Other Standing Lumbar Exercises tried multifi walkouts but limited with R hip pain    Other Standing Lumbar Exercises farmers carries with 10lb DB 3x [facility]      Lumbar Exercises: Seated   Other Seated Lumbar Exercises  seated DL with 7lb weights 2x10      Moist Heat Therapy   Number Minutes Moist Heat 10 Minutes    Moist Heat Location Lumbar Spine                       PT Short Term Goals - 03/08/21 1106       PT SHORT TERM GOAL #1   Title Independent with initial HEP    Time 2    Period Weeks    Status Achieved    Target Date 03/13/21               PT Long Term Goals - 05/23/21 0935       PT LONG TERM GOAL #1   Title Independent with progressed HEP to improve outcomes    Status Partially Met      PT LONG TERM GOAL #2   Title Patient will demonstrate full pain free cervical AROM for safety with driving and ADLs    Baseline full passive motion but still tight in suboccipitals and levator scapulae restricting full active motion.    Status On-going      PT LONG TERM GOAL #3   Title Pt. will report 75% improvement in frequency/intensity of neck and back pain.    Baseline Average pain for neck and back ix 4/10    Status Achieved      PT LONG TERM GOAL #4    Title Pt. will improve FOTO to at least 50 (lumbar) to demonstrate improved functional mobility    Status On-going      PT LONG TERM GOAL #5   Title Pt. will be able to lift 25# without increased LBP.    Baseline can lift laundry basket without difficulty (about 10#) feels pain in both neck and back    Status On-going                   Plan - 06/12/21 1058     Clinical Impression Statement Pt has been improving with pain levels lately, although being limited with hip pain with certain exercises today. Did a combination of core stab, general exercises, and thoracic mobility today. Pt had reports of relief with the seated DL and thoracic rotation in standing. Modified or discontinued exercises that caused pain. Pt was able to carry 10lb w/o pain today 3x around the clinic, next session maybe try to progress carrying and lifting weight to improve tolerance with laundry at home.    Personal Factors and Comorbidities Comorbidity 3+;Time since onset of injury/illness/exacerbation;Finances    Comorbidities HIstory of hernia repair, migraines, orthostatic hypotension, chronic neck and back pain.    PT Frequency 2x / week    PT Duration Other (comment)   5 weeks   PT Treatment/Interventions ADLs/Self Care Home Management;Cryotherapy;Electrical Stimulation;Aquatic Therapy;Ultrasound;Moist Heat;Traction;Stair training;Functional mobility training;Therapeutic activities;Therapeutic exercise;Neuromuscular re-education;Passive range of motion;Dry needling;Taping;Spinal Manipulations;Joint Manipulations    PT Next Visit Plan aquatic therapy; progress to box carries; continue strengthening, progress dead lifts and squats with heavier weight as tolerated; add farmer carries    PT Home Exercise Plan [id number]    Consulted and Agree with Plan of Care Patient             Patient will benefit from skilled therapeutic intervention in order to improve the following deficits and impairments:  Decreased  activity tolerance, Decreased endurance, Decreased range of motion, Decreased strength, Increased fascial restricitons, Impaired perceived functional ability, Improper body mechanics, Pain, Decreased  mobility, Difficulty walking, Increased muscle spasms, Postural dysfunction  Visit Diagnosis: Cervicalgia  Chronic midline low back pain with bilateral sciatica  Muscle weakness (generalized)  Other muscle spasm  Difficulty in walking, not elsewhere classified     Problem List There are no problems to display for this patient.   Artist Pais, PTA 06/12/2021, 11:53 AM  [facility] [address]  [place], [place], [address] Phone: [phone]   Fax:  [phone]  Name: Carla Hines MRN: [id number] Date of Birth: 1966/03/31

## 2021-06-17 ENCOUNTER — Encounter (HOSPITAL_BASED_OUTPATIENT_CLINIC_OR_DEPARTMENT_OTHER): Payer: Self-pay | Admitting: Physical Therapy

## 2021-06-17 ENCOUNTER — Other Ambulatory Visit: Payer: Self-pay

## 2021-06-17 ENCOUNTER — Ambulatory Visit (HOSPITAL_BASED_OUTPATIENT_CLINIC_OR_DEPARTMENT_OTHER): Payer: Medicaid Other | Admitting: Physical Therapy

## 2021-06-17 DIAGNOSIS — M542 Cervicalgia: Secondary | ICD-10-CM

## 2021-06-17 DIAGNOSIS — M62838 Other muscle spasm: Secondary | ICD-10-CM

## 2021-06-17 DIAGNOSIS — G8929 Other chronic pain: Secondary | ICD-10-CM

## 2021-06-17 DIAGNOSIS — R262 Difficulty in walking, not elsewhere classified: Secondary | ICD-10-CM

## 2021-06-17 DIAGNOSIS — M6281 Muscle weakness (generalized): Secondary | ICD-10-CM

## 2021-06-17 DIAGNOSIS — M5442 Lumbago with sciatica, left side: Secondary | ICD-10-CM

## 2021-06-17 NOTE — Therapy (Signed)
[place], [place], [address] Phone: [phone]   Fax:  [phone]  Physical Therapy Treatment  Patient Details  Name: Omega Durante MRN: [id number] Date of Birth: 04/07/1966 Referring Provider (PT): Normajean Glasgow, MD   Encounter Date: 06/17/2021   PT End of Session - 06/17/21 0959     Visit Number 23    Number of Visits 26    Date for PT Re-Evaluation 06/27/21    Authorization Type [employer]    Authorization - Visit Number 6    Authorization - Number of Visits 12    Progress Note Due on Visit 10    PT Start Time 0946    PT Stop Time 1030    PT Time Calculation (min) 44 min    Activity Tolerance Patient tolerated treatment well;Patient limited by pain    Behavior During Therapy [facility] for tasks assessed/performed             Past Medical History:  Diagnosis Date   Acid reflux    Migraines     Past Surgical History:  Procedure Laterality Date   CERVIX SURGERY     HERNIA REPAIR      There were no vitals filed for this visit.   Subjective Assessment - 06/17/21 0959     Subjective Increased left cervical and shoulder pain throughout weekend after relations with her husband.  States left arm had some numbness and weakness.  Resolved by next morning after taking meds and resting.    Pain Score 0-No pain    Pain Location Neck    Pain Orientation Left    Pain Descriptors / Indicators Numbness;Heaviness    Pain Type Chronic pain    Pain Onset Other (comment)    Pain Frequency Intermittent    Pain Score 2    Pain Location Back    Pain Orientation Left    Pain Descriptors / Indicators Aching    Pain Type Chronic pain             Pt seen for aquatic therapy today.  Treatment took place in water 3.25-4.8 ft in depth at the [facility] pool. Temp of water was 91.  Pt entered/exited the pool via stairs (step through pattern) independently with bilat rail.   Reviewed  current function, pain levels, response to prior Rx, and HEP compliance.      Walking forward, backward and sidestepping 4 widths ea   Standing Shoulder flex and add/abd using 1 foam hand buoy x12 -horizontal abduction unresisted x 10  Seated -stretching: gastroc, hamstring, add and glut/piriformis 2- 3x20 sec hold -flutter kicking at hip 3 x 25 reps; add/abd 5x20 (strengthening and aerobic capacity benefit)   Standing -kick board push down x15. Cues for abdominal bracing and glut contraction -lumbar rotations x20 Resisted core rotations R/L x10.  Verbal cues and demonstration for proper execution  Walking forward and back throughout session recovery.   Pt requires buoyancy for support and to offload joints with strengthening exercises. Viscosity of the water is needed for resistance of strengthening; water current perturbations provides challenge to standing balance unsupported, requiring increased core activation.                             PT Short Term Goals - 03/08/21 1106       PT SHORT TERM GOAL #1   Title Independent with initial HEP    Time 2  Period Weeks    Status Achieved    Target Date 03/13/21               PT Long Term Goals - 05/23/21 0935       PT LONG TERM GOAL #1   Title Independent with progressed HEP to improve outcomes    Status Partially Met      PT LONG TERM GOAL #2   Title Patient will demonstrate full pain free cervical AROM for safety with driving and ADLs    Baseline full passive motion but still tight in suboccipitals and levator scapulae restricting full active motion.    Status On-going      PT LONG TERM GOAL #3   Title Pt. will report 75% improvement in frequency/intensity of neck and back pain.    Baseline Average pain for neck and back ix 4/10    Status Achieved      PT LONG TERM GOAL #4   Title Pt. will improve FOTO to at least 50 (lumbar) to demonstrate improved functional mobility    Status  On-going      PT LONG TERM GOAL #5   Title Pt. will be able to lift 25# without increased LBP.    Baseline can lift laundry basket without difficulty (about 10#) feels pain in both neck and back    Status On-going                   Plan - 06/17/21 1005     Clinical Impression Statement Increased sx of cevical spine and lue over weekend which resolved itself within 24 hour. Pt instructed on relaxation and stretching of cervical spine throughout day. Added resisted core rotations which she tolerates very well.  No gait deviations noted.  Pt moving fluidly in pool without increase in sx.    Stability/Clinical Decision Making Evolving/Moderate complexity    Clinical Decision Making Moderate    Rehab Potential Good    PT Frequency 2x / week    PT Treatment/Interventions ADLs/Self Care Home Management;Cryotherapy;Electrical Stimulation;Aquatic Therapy;Ultrasound;Moist Heat;Traction;Stair training;Functional mobility training;Therapeutic activities;Therapeutic exercise;Neuromuscular re-education;Passive range of motion;Dry needling;Taping;Spinal Manipulations;Joint Manipulations    PT Next Visit Plan aquatic therapy; progress to box carries; continue strengthening, progress dead lifts and squats with heavier weight as tolerated; add farmer carries    PT Home Exercise Plan [id number]              Patient will benefit from skilled therapeutic intervention in order to improve the following deficits and impairments:  Decreased activity tolerance, Decreased endurance, Decreased range of motion, Decreased strength, Increased fascial restricitons, Impaired perceived functional ability, Improper body mechanics, Pain, Decreased mobility, Difficulty walking, Increased muscle spasms, Postural dysfunction  Visit Diagnosis: Cervicalgia  Chronic midline low back pain with bilateral sciatica  Muscle weakness (generalized)  Difficulty in walking, not elsewhere classified  Other muscle  spasm     Problem List There are no problems to display for this patient.   [address] [place]) Loveta Dellis MPT 06/17/2021, 10:35 AM  [place] [address] [place], [place], [address] Phone: [phone]   Fax:  [phone]  Name: Shiann Kam MRN: [id number] Date of Birth: 11-26-1965

## 2021-06-19 ENCOUNTER — Ambulatory Visit: Payer: Medicaid Other

## 2021-06-19 ENCOUNTER — Other Ambulatory Visit: Payer: Self-pay

## 2021-06-19 DIAGNOSIS — M6281 Muscle weakness (generalized): Secondary | ICD-10-CM

## 2021-06-19 DIAGNOSIS — M542 Cervicalgia: Secondary | ICD-10-CM | POA: Diagnosis not present

## 2021-06-19 DIAGNOSIS — G8929 Other chronic pain: Secondary | ICD-10-CM

## 2021-06-19 DIAGNOSIS — M62838 Other muscle spasm: Secondary | ICD-10-CM

## 2021-06-19 DIAGNOSIS — R262 Difficulty in walking, not elsewhere classified: Secondary | ICD-10-CM

## 2021-06-19 NOTE — Therapy (Signed)
[facility] [address]  [place], [place], [address] Phone: [phone]   Fax:  [phone]  Physical Therapy Treatment  Patient Details  Name: Carla Hines MRN: [id number] Date of Birth: 1966-03-29 Referring Provider (PT): Normajean Glasgow, MD   Encounter Date: 06/19/2021   PT End of Session - 06/19/21 1101     Visit Number 24    Number of Visits 26    Date for PT Re-Evaluation 06/27/21    Authorization Type [employer]    Authorization - Visit Number 7    Authorization - Number of Visits 12    Progress Note Due on Visit 10    PT Start Time 1017    PT Stop Time 1100    PT Time Calculation (min) 43 min    Activity Tolerance Patient tolerated treatment well    Behavior During Therapy WFL for tasks assessed/performed             Past Medical History:  Diagnosis Date   Acid reflux    Migraines     Past Surgical History:  Procedure Laterality Date   CERVIX SURGERY     HERNIA REPAIR      There were no vitals filed for this visit.   Subjective Assessment - 06/19/21 1020     Subjective Neck has been hurting today, has not been hurting for the past 2 days.    Pertinent History chronic neck and back pain, orthostatic hypotension    Diagnostic tests X-rays- unavailable for review.  Reports "leaking" disc in back and bulging disc in neck    Patient Stated Goals being able to function more and longer period of time    Currently in Pain? Yes    Pain Score 3     Pain Location Neck    Pain Orientation Mid    Pain Descriptors / Indicators Tightness;Aching    Pain Type Chronic pain                [facility] PT Assessment - 06/19/21 0001       AROM   Cervical - Right Side Bend 42    Cervical - Left Side Bend 46    Cervical - Right Rotation 65    Cervical - Left Rotation 68                           [facility] Adult PT Treatment/Exercise - 06/19/21 0001       Neck Exercises:  Seated   Other Seated Exercise cervical snags into extension and rotation with towel 10x3"      Lumbar Exercises: Stretches   Other Lumbar Stretch Exercise Bil standing plank on the counter with thoracic rotation 15 reps      Lumbar Exercises: Aerobic   Tread Mill 1.0 incline, 1.0 mph 7 min      Lumbar Exercises: Machines for Strengthening   Leg Press 20lb 2x10 BLE    Other Lumbar Machine Exercise cybex row unilateral R/L with thoracic rotation 10lb 2x10    Other Lumbar Machine Exercise lat pulls standing 25lb 10 reps      Lumbar Exercises: Standing   Other Standing Lumbar Exercises hip hinge with 1HA on counter 10 reps      Knee/Hip Exercises: Standing   Forward Step Up Both;10 reps;Hand Hold: 0;Step Height: 6"  PT Short Term Goals - 03/08/21 1106       PT SHORT TERM GOAL #1   Title Independent with initial HEP    Time 2    Period Weeks    Status Achieved    Target Date 03/13/21               PT Long Term Goals - 05/23/21 0935       PT LONG TERM GOAL #1   Title Independent with progressed HEP to improve outcomes    Status Partially Met      PT LONG TERM GOAL #2   Title Patient will demonstrate full pain free cervical AROM for safety with driving and ADLs    Baseline full passive motion but still tight in suboccipitals and levator scapulae restricting full active motion.    Status On-going      PT LONG TERM GOAL #3   Title Pt. will report 75% improvement in frequency/intensity of neck and back pain.    Baseline Average pain for neck and back ix 4/10    Status Achieved      PT LONG TERM GOAL #4   Title Pt. will improve FOTO to at least 50 (lumbar) to demonstrate improved functional mobility    Status On-going      PT LONG TERM GOAL #5   Title Pt. will be able to lift 25# without increased LBP.    Baseline can lift laundry basket without difficulty (about 10#) feels pain in both neck and back    Status On-going                    Plan - 06/19/21 1102     Clinical Impression Statement Pt continues to note benefit from aquatic combined with land therapy. Had initial complaints of neck pain but as we progressed with the session pain decreased. Cues were given for relaxation of muscles during the UE exercises. Improvements were shown with Bil side bend ROM today, she continues making progress with decreased pain levels and cervical ROM.    Personal Factors and Comorbidities Comorbidity 3+;Time since onset of injury/illness/exacerbation;Finances    Comorbidities HIstory of hernia repair, migraines, orthostatic hypotension, chronic neck and back pain.    PT Frequency 2x / week    PT Duration Other (comment)   5 weeks   PT Treatment/Interventions ADLs/Self Care Home Management;Cryotherapy;Electrical Stimulation;Aquatic Therapy;Ultrasound;Moist Heat;Traction;Stair training;Functional mobility training;Therapeutic activities;Therapeutic exercise;Neuromuscular re-education;Passive range of motion;Dry needling;Taping;Spinal Manipulations;Joint Manipulations    PT Next Visit Plan aquatic therapy; progress to box carries; continue strengthening, progress dead lifts and squats with heavier weight as tolerated; add farmer carries    PT Home Exercise Plan [id number]    Consulted and Agree with Plan of Care Patient             Patient will benefit from skilled therapeutic intervention in order to improve the following deficits and impairments:  Decreased activity tolerance, Decreased endurance, Decreased range of motion, Decreased strength, Increased fascial restricitons, Impaired perceived functional ability, Improper body mechanics, Pain, Decreased mobility, Difficulty walking, Increased muscle spasms, Postural dysfunction  Visit Diagnosis: Cervicalgia  Chronic midline low back pain with bilateral sciatica  Muscle weakness (generalized)  Difficulty in walking, not elsewhere classified  Other muscle  spasm     Problem List There are no problems to display for this patient.   Artist Pais, PTA 06/19/2021, 11:26 AM  [facility] [address]  [address] [place]  [place], [place], [address] Phone: [phone]   Fax:  [phone]  Name: Carla Hines MRN: [id number] Date of Birth: 1965/11/07

## 2021-06-23 ENCOUNTER — Encounter (HOSPITAL_BASED_OUTPATIENT_CLINIC_OR_DEPARTMENT_OTHER): Payer: Self-pay | Admitting: Urology

## 2021-06-23 ENCOUNTER — Other Ambulatory Visit: Payer: Self-pay

## 2021-06-23 ENCOUNTER — Emergency Department (HOSPITAL_BASED_OUTPATIENT_CLINIC_OR_DEPARTMENT_OTHER): Payer: Medicaid Other

## 2021-06-23 ENCOUNTER — Emergency Department (HOSPITAL_BASED_OUTPATIENT_CLINIC_OR_DEPARTMENT_OTHER)
Admission: EM | Admit: 2021-06-23 | Discharge: 2021-06-24 | Disposition: A | Discharge status: Home / Self Care | Payer: Medicaid Other | Attending: Emergency Medicine | Admitting: Emergency Medicine

## 2021-06-23 DIAGNOSIS — K625 Hemorrhage of anus and rectum: Secondary | ICD-10-CM | POA: Insufficient documentation

## 2021-06-23 DIAGNOSIS — T50905A Adverse effect of unspecified drugs, medicaments and biological substances, initial encounter: Secondary | ICD-10-CM

## 2021-06-23 DIAGNOSIS — R109 Unspecified abdominal pain: Secondary | ICD-10-CM | POA: Diagnosis present

## 2021-06-23 LAB — COMPREHENSIVE METABOLIC PANEL
ALT: 20 U/L (ref 0–44)
AST: 23 U/L (ref 15–41)
Albumin: 4.4 g/dL (ref 3.5–5.0)
Alkaline Phosphatase: 72 U/L (ref 38–126)
Anion gap: 7 (ref 5–15)
BUN: 12 mg/dL (ref 6–20)
CO2: 29 mmol/L (ref 22–32)
Calcium: 9.2 mg/dL (ref 8.9–10.3)
Chloride: 103 mmol/L (ref 98–111)
Creatinine, Ser: 0.94 mg/dL (ref 0.44–1.00)
GFR, Estimated: >60 mL/min (ref >60)
Glucose, Bld: 91 mg/dL (ref 70–99)
Potassium: 4.7 mmol/L (ref 3.5–5.1)
Sodium: 139 mmol/L (ref 135–145)
Total Bilirubin: 0.4 mg/dL (ref 0.3–1.2)
Total Protein: 7.7 g/dL (ref 6.5–8.1)

## 2021-06-23 LAB — CBC WITH DIFFERENTIAL/PLATELET
Abs Immature Granulocytes: 0.01 10*3/uL (ref 0.00–0.07)
Basophils Absolute: 0 10*3/uL (ref 0.0–0.1)
Basophils Relative: 0 %
Eosinophils Absolute: 0 10*3/uL (ref 0.0–0.5)
Eosinophils Relative: 1 %
HCT: 35.1 % — ABNORMAL LOW (ref 36.0–46.0)
Hemoglobin: 11.4 g/dL — ABNORMAL LOW (ref 12.0–15.0)
Immature Granulocytes: 0 %
Lymphocytes Relative: 46 %
Lymphs Abs: 2 10*3/uL (ref 0.7–4.0)
MCH: 29.8 pg (ref 26.0–34.0)
MCHC: 32.5 g/dL (ref 30.0–36.0)
MCV: 91.6 fL (ref 80.0–100.0)
Monocytes Absolute: 0.5 10*3/uL (ref 0.1–1.0)
Monocytes Relative: 11 %
Neutro Abs: 1.8 10*3/uL (ref 1.7–7.7)
Neutrophils Relative %: 42 %
Platelets: 332 10*3/uL (ref 150–400)
RBC: 3.83 MIL/uL — ABNORMAL LOW (ref 3.87–5.11)
RDW: 13.6 % (ref 11.5–15.5)
WBC: 4.2 10*3/uL (ref 4.0–10.5)
nRBC: 0 % (ref 0.0–0.2)

## 2021-06-23 LAB — OCCULT BLOOD X 1 CARD TO LAB, STOOL: Fecal Occult Bld: POSITIVE — AB

## 2021-06-23 NOTE — ED Notes (Signed)
ED Provider at bedside. Dr. Randal Buba

## 2021-06-23 NOTE — ED Triage Notes (Signed)
Pt states nausea x 2 months r/t pregablin States loose stools, states bright red blood in 1 stool today but no blood in following stools  States dizziness x3 days

## 2021-06-23 NOTE — ED Notes (Signed)
X-ray at bedside

## 2021-06-24 ENCOUNTER — Encounter (HOSPITAL_BASED_OUTPATIENT_CLINIC_OR_DEPARTMENT_OTHER): Payer: Self-pay | Admitting: Emergency Medicine

## 2021-06-24 ENCOUNTER — Encounter (HOSPITAL_BASED_OUTPATIENT_CLINIC_OR_DEPARTMENT_OTHER): Payer: Self-pay | Admitting: Physical Therapy

## 2021-06-24 ENCOUNTER — Emergency Department (HOSPITAL_BASED_OUTPATIENT_CLINIC_OR_DEPARTMENT_OTHER): Payer: Medicaid Other

## 2021-06-24 ENCOUNTER — Ambulatory Visit (HOSPITAL_BASED_OUTPATIENT_CLINIC_OR_DEPARTMENT_OTHER): Payer: Medicaid Other | Admitting: Physical Therapy

## 2021-06-24 DIAGNOSIS — G8929 Other chronic pain: Secondary | ICD-10-CM

## 2021-06-24 DIAGNOSIS — M542 Cervicalgia: Secondary | ICD-10-CM | POA: Diagnosis not present

## 2021-06-24 DIAGNOSIS — M6281 Muscle weakness (generalized): Secondary | ICD-10-CM

## 2021-06-24 DIAGNOSIS — M5442 Lumbago with sciatica, left side: Secondary | ICD-10-CM

## 2021-06-24 LAB — URINALYSIS, MICROSCOPIC (REFLEX)

## 2021-06-24 LAB — URINALYSIS, ROUTINE W REFLEX MICROSCOPIC
Bilirubin Urine: NEGATIVE
Glucose, UA: NEGATIVE mg/dL
Hgb urine dipstick: NEGATIVE
Ketones, ur: NEGATIVE mg/dL
Nitrite: NEGATIVE
Protein, ur: NEGATIVE mg/dL
Specific Gravity, Urine: 1.015 (ref 1.005–1.030)
pH: 6 (ref 5.0–8.0)

## 2021-06-24 MED ORDER — OMEPRAZOLE 20 MG PO CPDR: 20.0000 mg | DELAYED_RELEASE_CAPSULE | Freq: Every day | ORAL | 0 refills | Status: AC

## 2021-06-24 MED ORDER — IOHEXOL 300 MG/ML  SOLN
100.0000 mL | Freq: Once | INTRAMUSCULAR | Status: AC | PRN
  Administered 2021-06-24: 100 mL via INTRAVENOUS

## 2021-06-24 NOTE — Therapy (Signed)
[place] °[facility] °[address] °[place], [place], [address] °Phone: [phone]   Fax:  [phone] ° °Physical Therapy Treatment ° °Patient Details  °Name: Carla Hines °MRN: [id number] °Date of Birth: 02/06/1966 °Referring Provider (PT): Wang, Hao, MD ° ° °Encounter Date: 06/24/2021 ° ° PT End of Session - 06/24/21 0954   ° ° Visit Number 25   ° Number of Visits 26   ° Date for PT Re-Evaluation 06/27/21   ° Authorization Type [employer]   ° Authorization - Visit Number 7   ° Authorization - Number of Visits 12   ° Progress Note Due on Visit 10   ° PT Start Time 0951   ° Activity Tolerance Patient tolerated treatment well   ° Behavior During Therapy WFL for tasks assessed/performed   ° °  °  ° °  ° ° °Past Medical History:  °Diagnosis Date  ° Acid reflux   ° Migraines   ° ° °Past Surgical History:  °Procedure Laterality Date  ° CERVIX SURGERY    ° HERNIA REPAIR    ° ° °There were no vitals filed for this visit. ° ° Subjective Assessment - 06/24/21 0959   ° ° Subjective Pt with rectal bleeding yesterday to ER returning home early this am.  Reports she is now off of the pregabba, caused bleeding.  Increase in LBp 4/10.  Neck is better today.   ° Currently in Pain? Yes   ° Pain Score 6    ° Pain Location Abdomen   ° Pain Orientation Mid   ° Pain Type Acute pain   ° Pain Score 4   ° °  °  ° °  ° ° ° ° ° ° ° ° ° ° ° ° ° ° ° ° ° ° ° ° ° ° ° ° ° ° ° ° ° ° ° PT Short Term Goals - 03/08/21 1106   ° °  ° PT SHORT TERM GOAL #1  ° Title Independent with initial HEP   ° Time 2   ° Period Weeks   ° Status Achieved   ° Target Date 03/13/21   ° °  °  ° °  ° ° ° ° PT Long Term Goals - 05/23/21 0935   ° °  ° PT LONG TERM GOAL #1  ° Title Independent with progressed HEP to improve outcomes   ° Status Partially Met   °  ° PT LONG TERM GOAL #2  ° Title Patient will demonstrate full pain free cervical AROM for safety with driving and ADLs   ° Baseline full passive motion but still  tight in suboccipitals and levator scapulae restricting full active motion.   ° Status On-going   °  ° PT LONG TERM GOAL #3  ° Title Pt. will report 75% improvement in frequency/intensity of neck and back pain.   ° Baseline Average pain for neck and back ix 4/10   ° Status Achieved   °  ° PT LONG TERM GOAL #4  ° Title Pt. will improve FOTO to at least 50 (lumbar) to demonstrate improved functional mobility   ° Status On-going   °  ° PT LONG TERM GOAL #5  ° Title Pt. will be able to lift 25# without increased LBP.   ° Baseline can lift laundry basket without difficulty (about 10#) feels pain in both neck and back   ° Status On-going   ° °  °  ° °  ° °  Pt seen for aquatic therapy today.  Treatment took place in water 3.25-4.8 ft in depth at the [facility] pool. Temp of water was 91°.  Pt entered/exited the pool via stairs (step through pattern) independently with bilat rail. °  °Reviewed current function, pain levels, response to prior Rx, and HEP compliance.   °  °  °Walking forward, backward and sidestepping 4 widths ea  °  °  °Seated °-stretching: gastroc, hamstring, add and glut/piriformis 2- 3x20 sec hold °-flutter kicking at hip 3 x 25 reps; add/abd 5x20 (strengthening and aerobic capacity benefit) °  °Standing °-kick board push down 3x15. Cues for abdominal bracing and glut contraction °-Resisted core rotations R/L x10.  Verbal cues and demonstration for proper execution °-Cervical ROM and stretching all planes x10 °-Ue using 1 foam hand buoysshoulder horizontal add/abd; flex; add/abd x10 °  °Walking forward and back throughout session recovery. °  °Pt requires buoyancy for support and to offload joints with strengthening exercises. Viscosity of the water is needed for resistance of strengthening; water current perturbations provides challenge to standing balance unsupported, requiring increased core activation. ° ° ° ° ° ° °Patient will benefit from skilled therapeutic intervention in order to improve  the following deficits and impairments:    ° °Visit Diagnosis: °Cervicalgia ° °Chronic midline low back pain with bilateral sciatica ° °Muscle weakness (generalized) ° ° ° ° °Problem List °There are no problems to display for this patient. ° ° °Mary F Ziemba, PT °06/24/2021, 10:16 AM ° °[place] °[facility] °[address] °[place], [place], [address] °Phone: [phone]   Fax:  [phone] ° °Name: Italia Bonet °MRN: [id number] °Date of Birth: 05/17/1965 ° ° ° °

## 2021-06-24 NOTE — ED Provider Notes (Signed)
[facility] EMERGENCY DEPARTMENT Provider Note   CSN: [id number] Arrival date & time: 06/23/21  2057     History  Chief Complaint  Patient presents with   Nausea   Dizziness    Carla Hines is a 56 y.o. female.  The history is provided by the patient.  Rectal Bleeding Quality: one streak of red blood on the stool seen only once today, stools have been lose but not diarrhea. Amount:  Scant Duration: 1 day. Timing:  Rare Chronicity:  New Context: defecation   Context: not anal penetration, not constipation, not foreign body and not rectal pain   Relieved by:  Nothing Worsened by:  Nothing Ineffective treatments:  None tried Associated symptoms: no abdominal pain, no dizziness, no epistaxis, no fever, no loss of consciousness, no recent illness and no vomiting   Risk factors: no anticoagulant use   Patient who was placed on Lyrica and then neurontin for pain presents for stomach upset related to these medications and loose stool and one episode of red blood streaked stool earlier in the day.  Patient reports she did not tolerate lyrica as it made her lightheaded and then nauseated and upset stomach and now neurontin is doing the same.      Home Medications Prior to Admission medications   Medication Sig Start Date End Date Taking? Authorizing Provider  omeprazole (PRILOSEC) 20 MG capsule Take 1 capsule (20 mg total) by mouth daily. 06/24/21  Yes Telisa Ohlsen, MD  cholecalciferol (VITAMIN D) 1000 units tablet Take 3,000 Units by mouth daily.    [provider]  cyclobenzaprine (FLEXERIL) 5 MG tablet Take 5 mg by mouth 3 (three) times daily as needed for muscle spasms.    [provider]  methocarbamol (ROBAXIN) 500 MG tablet Take 1 tablet (500 mg total) by mouth 2 (two) times daily. 01/08/20   Jeannie Fend, PA-C  OMEGA-3 FATTY ACIDS PO Take by mouth.    [provider]  omeprazole (PRILOSEC) 20 MG capsule Take 20 mg by mouth daily as  needed (acid reflux).     [provider]  ondansetron (ZOFRAN ODT) 4 MG disintegrating tablet Take 1 tablet (4 mg total) by mouth every 8 (eight) hours as needed for nausea or vomiting. 07/17/20   Placido Sou, PA-C  Prenatal Vit-Fe Fumarate-FA (PRENATAL MULTIVITAMIN) TABS tablet Take 1 tablet by mouth every morning.     [provider]  traMADol (ULTRAM) 50 MG tablet Take 1 tablet (50 mg total) by mouth every 6 (six) hours as needed. 07/04/17   Sharlene Dory, DO  vitamin B-12 (CYANOCOBALAMIN) 1000 MCG tablet Take 1,000 mcg by mouth daily.    [provider]      Allergies    Other, Prednisone, Promethazine, Gabapentin, Phenergan [promethazine hcl], Pregabalin, and Reglan [metoclopramide]    Review of Systems   Review of Systems  Constitutional:  Negative for fever.  HENT:  Negative for nosebleeds.   Eyes:  Negative for photophobia.  Respiratory:  Negative for shortness of breath, wheezing and stridor.   Cardiovascular:  Negative for chest pain.  Gastrointestinal:  Positive for anal bleeding and hematochezia. Negative for abdominal pain and vomiting.  Musculoskeletal:  Negative for back pain.  Neurological:  Negative for dizziness and loss of consciousness.  Psychiatric/Behavioral:  Negative for agitation.   All other systems reviewed and are negative.  Physical Exam Updated Vital Signs BP (!) 144/93    Pulse 72    Temp 98 F (36.7 C) (  Oral)    Resp 18    Ht 5\' 4"  (1.626 m)    Wt 63.5 kg    SpO2 100%    BMI 24.03 kg/m  Physical Exam Vitals and nursing note reviewed. Exam conducted with a chaperone present.  Constitutional:      General: She is not in acute distress.    Appearance: Normal appearance.  HENT:     Head: Normocephalic and atraumatic.  Eyes:     Conjunctiva/sclera: Conjunctivae normal.     Pupils: Pupils are equal, round, and reactive to light.  Cardiovascular:     Rate and Rhythm: Normal rate and regular rhythm.     Pulses:  Normal pulses.     Heart sounds: Normal heart sounds.  Pulmonary:     Effort: Pulmonary effort is normal.     Breath sounds: Normal breath sounds.  Abdominal:     General: Abdomen is flat. Bowel sounds are normal.     Palpations: Abdomen is soft.     Tenderness: There is no abdominal tenderness. There is no guarding or rebound.     Hernia: No hernia is present.  Genitourinary:    Rectum: Guaiac result positive.     Comments: Normal colored stool  Musculoskeletal:        General: Normal range of motion.     Cervical back: Normal range of motion and neck supple.  Skin:    General: Skin is warm and dry.     Capillary Refill: Capillary refill takes less than 2 seconds.  Neurological:     General: No focal deficit present.     Mental Status: She is alert and oriented to person, place, and time.     Deep Tendon Reflexes: Reflexes normal.  Psychiatric:        Mood and Affect: Mood normal.        Behavior: Behavior normal.    ED Results / Procedures / Treatments   Labs (all labs ordered are listed, but only abnormal results are displayed) Results for orders placed or performed during the hospital encounter of 06/23/21  CBC with Differential  Result Value Ref Range   WBC 4.2 4.0 - 10.5 K/uL   RBC 3.83 (L) 3.87 - 5.11 MIL/uL   Hemoglobin 11.4 (L) 12.0 - 15.0 g/dL   HCT 76.7 (L) 20.9 - 47.0 %   MCV 91.6 80.0 - 100.0 fL   MCH 29.8 26.0 - 34.0 pg   MCHC 32.5 30.0 - 36.0 g/dL   RDW 96.2 83.6 - 62.9 %   Platelets 332 150 - 400 K/uL   nRBC 0.0 0.0 - 0.2 %   Neutrophils Relative % 42 %   Neutro Abs 1.8 1.7 - 7.7 K/uL   Lymphocytes Relative 46 %   Lymphs Abs 2.0 0.7 - 4.0 K/uL   Monocytes Relative 11 %   Monocytes Absolute 0.5 0.1 - 1.0 K/uL   Eosinophils Relative 1 %   Eosinophils Absolute 0.0 0.0 - 0.5 K/uL   Basophils Relative 0 %   Basophils Absolute 0.0 0.0 - 0.1 K/uL   Immature Granulocytes 0 %   Abs Immature Granulocytes 0.01 0.00 - 0.07 K/uL  Comprehensive metabolic  panel  Result Value Ref Range   Sodium 139 135 - 145 mmol/L   Potassium 4.7 3.5 - 5.1 mmol/L   Chloride 103 98 - 111 mmol/L   CO2 29 22 - 32 mmol/L   Glucose, Bld 91 70 - 99 mg/dL   BUN 12 6 - 20  mg/dL   Creatinine, Ser 1.610.94 0.44 - 1.00 mg/dL   Calcium 9.2 8.9 - 09.610.3 mg/dL   Total Protein 7.7 6.5 - 8.1 g/dL   Albumin 4.4 3.5 - 5.0 g/dL   AST 23 15 - 41 U/L   ALT 20 0 - 44 U/L   Alkaline Phosphatase 72 38 - 126 U/L   Total Bilirubin 0.4 0.3 - 1.2 mg/dL   GFR, Estimated >04>60 >54>60 mL/min   Anion gap 7 5 - 15  Urinalysis, Routine w reflex microscopic Urine, Clean Catch  Result Value Ref Range   Color, Urine YELLOW YELLOW   APPearance CLEAR CLEAR   Specific Gravity, Urine 1.015 1.005 - 1.030   pH 6.0 5.0 - 8.0   Glucose, UA NEGATIVE NEGATIVE mg/dL   Hgb urine dipstick NEGATIVE NEGATIVE   Bilirubin Urine NEGATIVE NEGATIVE   Ketones, ur NEGATIVE NEGATIVE mg/dL   Protein, ur NEGATIVE NEGATIVE mg/dL   Nitrite NEGATIVE NEGATIVE   Leukocytes,Ua MODERATE (A) NEGATIVE  Occult blood card to lab, stool Provider will collect  Result Value Ref Range   Fecal Occult Bld POSITIVE (A) NEGATIVE  Urinalysis, Microscopic (reflex)  Result Value Ref Range   RBC / HPF 6-10 0 - 5 RBC/hpf   WBC, UA 11-20 0 - 5 WBC/hpf   Bacteria, UA FEW (A) NONE SEEN   Squamous Epithelial / LPF 0-5 0 - 5   Non Squamous Epithelial PRESENT (A) NONE SEEN   CT ABDOMEN PELVIS W CONTRAST  Result Date: 06/24/2021 CLINICAL DATA:  Nausea and loose stools. EXAM: CT ABDOMEN AND PELVIS WITH CONTRAST TECHNIQUE: Multidetector CT imaging of the abdomen and pelvis was performed using the standard protocol following bolus administration of intravenous contrast. RADIATION DOSE REDUCTION: This exam was performed according to the departmental dose-optimization program which includes automated exposure control, adjustment of the mA and/or kV according to patient size and/or use of iterative reconstruction technique. CONTRAST:  100mL  OMNIPAQUE IOHEXOL 300 MG/ML  SOLN COMPARISON:  June 08, 2015 FINDINGS: Lower chest: No acute abnormality. Hepatobiliary: No focal liver abnormality is seen. No gallstones, gallbladder wall thickening, or biliary dilatation. Pancreas: Unremarkable. No pancreatic ductal dilatation or surrounding inflammatory changes. Spleen: Normal in size without focal abnormality. Adrenals/Urinary Tract: Adrenal glands are unremarkable. Kidneys are normal, without renal calculi, focal lesion, or hydronephrosis. Bladder is unremarkable. Stomach/Bowel: Stomach is within normal limits. Appendix appears normal. No evidence of bowel wall thickening, distention, or inflammatory changes. Vascular/Lymphatic: No significant vascular findings are present. No enlarged abdominal or pelvic lymph nodes. Reproductive: Uterus and bilateral adnexa are unremarkable. Bilateral tubal ligation clips are seen. Other: No abdominal wall hernia or abnormality. No abdominopelvic ascites. Musculoskeletal: No acute or significant osseous findings. IMPRESSION: No acute or active process within the abdomen or pelvis. Electronically Signed   By: Aram Candelahaddeus  Houston M.D.   On: 06/24/2021 01:10   DG Chest Portable 1 View  Result Date: 06/24/2021 CLINICAL DATA:  Nausea, dizziness and bright red blood per rectum. EXAM: PORTABLE CHEST 1 VIEW COMPARISON:  July 17, 2020 FINDINGS: The heart size and mediastinal contours are within normal limits. A trace amount of linear atelectasis is seen along the lateral aspect of the left lung base. Both lungs are otherwise clear. The visualized skeletal structures are unremarkable. IMPRESSION: No active disease. Electronically Signed   By: Aram Candelahaddeus  Houston M.D.   On: 06/24/2021 00:04    EKG None  Radiology CT ABDOMEN PELVIS W CONTRAST  Result Date: 06/24/2021 CLINICAL DATA:  Nausea and loose stools.  EXAM: CT ABDOMEN AND PELVIS WITH CONTRAST TECHNIQUE: Multidetector CT imaging of the abdomen and pelvis was performed  using the standard protocol following bolus administration of intravenous contrast. RADIATION DOSE REDUCTION: This exam was performed according to the departmental dose-optimization program which includes automated exposure control, adjustment of the mA and/or kV according to patient size and/or use of iterative reconstruction technique. CONTRAST:  100mL OMNIPAQUE IOHEXOL 300 MG/ML  SOLN COMPARISON:  June 08, 2015 FINDINGS: Lower chest: No acute abnormality. Hepatobiliary: No focal liver abnormality is seen. No gallstones, gallbladder wall thickening, or biliary dilatation. Pancreas: Unremarkable. No pancreatic ductal dilatation or surrounding inflammatory changes. Spleen: Normal in size without focal abnormality. Adrenals/Urinary Tract: Adrenal glands are unremarkable. Kidneys are normal, without renal calculi, focal lesion, or hydronephrosis. Bladder is unremarkable. Stomach/Bowel: Stomach is within normal limits. Appendix appears normal. No evidence of bowel wall thickening, distention, or inflammatory changes. Vascular/Lymphatic: No significant vascular findings are present. No enlarged abdominal or pelvic lymph nodes. Reproductive: Uterus and bilateral adnexa are unremarkable. Bilateral tubal ligation clips are seen. Other: No abdominal wall hernia or abnormality. No abdominopelvic ascites. Musculoskeletal: No acute or significant osseous findings. IMPRESSION: No acute or active process within the abdomen or pelvis. Electronically Signed   By: Aram Candelahaddeus  Houston M.D.   On: 06/24/2021 01:10   DG Chest Portable 1 View  Result Date: 06/24/2021 CLINICAL DATA:  Nausea, dizziness and bright red blood per rectum. EXAM: PORTABLE CHEST 1 VIEW COMPARISON:  July 17, 2020 FINDINGS: The heart size and mediastinal contours are within normal limits. A trace amount of linear atelectasis is seen along the lateral aspect of the left lung base. Both lungs are otherwise clear. The visualized skeletal structures are  unremarkable. IMPRESSION: No active disease. Electronically Signed   By: Aram Candelahaddeus  Houston M.D.   On: 06/24/2021 00:04    Procedures Procedures    Medications Ordered in ED Medications  iohexol (OMNIPAQUE) 300 MG/ML solution 100 mL (100 mLs Intravenous Contrast Given 06/24/21 0054)    ED Course/ Medical Decision Making/ A&P                           Medical Decision Making Feeling unwell on new medication and having same symptoms are previous medication.  One episode of anal bleeding today   Amount and/or Complexity of Data Reviewed Independent Historian:     Details: daughter, see above External Data Reviewed: notes.    Details: office notes see care everywhere Labs: ordered.    Details: reviewed by me: guaiac is trace positive, normal colored stool.  Hemoglobin is 11.4 and is not changed from for July 2022 at [facility], normal platelets, normal electrolytes by me on metabolic panel Radiology: ordered.    Details: CT reviewed by me without acute finding  Risk Prescription drug management. Risk Details: Patient is very well appearing with one episode of bleeding.  She has had a a colonoscopy that was reportedly normal in the last 2 years.  She denies hematemesis and melena and stool is a normal color here tonight.  She is not orthostatic.  She is amenable to discharge with close follow up.  She will follow up with both her PMD and GI as an outpatient.  Strict return precautions given for melena, hematemesis, worsening rectal bleeding, weakness or any concerns.  Also stop your neurontin and notify pain management as this is causing an adverse reaction.     Final Clinical Impression(s) / ED Diagnoses Final diagnoses:  Anal bleeding   None    Return for intractable cough, coughing up blood, fevers > 100.4 unrelieved by medication, shortness of breath, intractable vomiting, chest pain, shortness of breath, weakness, numbness, changes in speech, facial asymmetry, abdominal pain,  passing out, Inability to tolerate liquids or food, cough, altered mental status or any concerns. No signs of systemic illness or infection. The patient is nontoxic-appearing on exam and vital signs are within normal limits.  I have reviewed the triage vital signs and the nursing notes. Pertinent labs & imaging results that were available during my care of the patient were reviewed by me and considered in my medical decision making (see chart for details). After history, exam, and medical workup I feel the patient has been appropriately medically screened and is safe for discharge home. Pertinent diagnoses were discussed with the patient. Patient was given return precautions. Rx / DC Orders ED Discharge Orders          Ordered    omeprazole (PRILOSEC) 20 MG capsule  Daily        06/24/21 0202              Thayne Cindric, MD 06/24/21 5638

## 2021-06-24 NOTE — ED Notes (Signed)
Patient transported to CT 

## 2021-06-26 ENCOUNTER — Encounter: Payer: Self-pay | Admitting: Physical Therapy

## 2021-06-26 ENCOUNTER — Other Ambulatory Visit: Payer: Self-pay

## 2021-06-26 ENCOUNTER — Ambulatory Visit: Payer: Medicaid Other | Admitting: Physical Therapy

## 2021-06-26 DIAGNOSIS — M62838 Other muscle spasm: Secondary | ICD-10-CM

## 2021-06-26 DIAGNOSIS — M542 Cervicalgia: Secondary | ICD-10-CM | POA: Diagnosis not present

## 2021-06-26 DIAGNOSIS — M6281 Muscle weakness (generalized): Secondary | ICD-10-CM

## 2021-06-26 DIAGNOSIS — R262 Difficulty in walking, not elsewhere classified: Secondary | ICD-10-CM

## 2021-06-26 DIAGNOSIS — G8929 Other chronic pain: Secondary | ICD-10-CM

## 2021-06-26 NOTE — Therapy (Signed)
[facility] [address]  [place], [place], [address] Phone: [phone]   Fax:  [phone]  Physical Therapy Treatment PHYSICAL THERAPY DISCHARGE SUMMARY  Visits from Start of Care: 26   Current functional level related to goals / functional outcomes: FOTO lumbar 43%.  Improved tolerance to exercise, decreased neck pain, improved cervical AROM   Remaining deficits: Low back pain, difficulty lifting   Education / Equipment: HEP  Plan: Patient agrees to discharge.  Patient goals were not met but overall good progress, 4/5 goals met or partially met. Patient is being discharged due to insurance/instruction of case manager.         Patient Details  Name: Carla Hines MRN: [id number] Date of Birth: [birth year] Referring Provider (PT): Normajean Glasgow, MD   Encounter Date: 06/26/2021   PT End of Session - 06/26/21 1023     Visit Number 26    Number of Visits 26    Date for PT Re-Evaluation 06/27/21    Authorization Type [employer]    Authorization - Visit Number 8    Authorization - Number of Visits 12    Progress Note Due on Visit 10    PT Start Time 1020    PT Stop Time 1115    PT Time Calculation (min) 55 min    Activity Tolerance Patient tolerated treatment well    Behavior During Therapy [facility] for tasks assessed/performed             Past Medical History:  Diagnosis Date   Acid reflux    Migraines     Past Surgical History:  Procedure Laterality Date   CERVIX SURGERY     HERNIA REPAIR      There were no vitals filed for this visit.   Subjective Assessment - 06/26/21 1025     Subjective Pt. reports neck is better, no pain today.  Back is bothering her.  Has to cancel remaining visits as workman's comp needs to close case.    Pertinent History chronic neck and back pain, orthostatic hypotension    Diagnostic tests X-rays- unavailable for review.  Reports "leaking" disc in back and  bulging disc in neck    Patient Stated Goals being able to function more and longer period of time    Currently in Pain? Yes    Pain Score 3     Pain Location Back                [facility] PT Assessment - 06/26/21 0001       Assessment   Medical Diagnosis M54.2 (ICD-10-CM) - Cervical pain  M54.50 (ICD-10-CM) - Lumbar pain    Referring Provider (PT) Normajean Glasgow, MD    Onset Date/Surgical Date 01/16/21      Restrictions   Weight Bearing Restrictions No      Observation/Other Assessments   Focus on Therapeutic Outcomes (FOTO)  43%      AROM   Cervical Flexion 60    Cervical Extension 50   pain at end range   Cervical - Right Rotation 70    Cervical - Left Rotation 70                           [facility] Adult PT Treatment/Exercise - 06/26/21 0001       Therapeutic Activites    Therapeutic Activities Lifting;Other Therapeutic Activities    Lifting education on safety with lifting, demo,  then return demo lifting 18lb box from floor to bench with good technique and no pain    Other Therapeutic Activities education on continued activities during break from PT, encouraged to continue working with [employer] then participating in water aerobics.  Review of goals and progress.      Exercises   Exercises Lumbar;Knee/Hip      Lumbar Exercises: Aerobic   Nustep [id number]      Lumbar Exercises: Machines for Strengthening   Leg Press 20lb 2x10 BLE    Other Lumbar Machine Exercise cybex row 20# 2 x 10    Other Lumbar Machine Exercise lat pulls standing 25lb 10 reps      Lumbar Exercises: Standing   Other Standing Lumbar Exercises RDL with lifting 3000g ball overhead x 10, then 5000g ball overhead x 5      Moist Heat Therapy   Number Minutes Moist Heat 15 Minutes    Moist Heat Location Cervical;Lumbar Spine      Electrical Stimulation   Electrical Stimulation Location low back    Electrical Stimulation Action IFC    Electrical Stimulation Parameters 80-150Hz,  intensity to tolerance    Electrical Stimulation Goals Pain                       PT Short Term Goals - 03/08/21 1106       PT SHORT TERM GOAL #1   Title Independent with initial HEP    Time 2    Period Weeks    Status Achieved    Target Date 03/13/21               PT Long Term Goals - 06/26/21 1024       PT LONG TERM GOAL #1   Title Independent with progressed HEP to improve outcomes    Time 6    Period Weeks    Status Achieved      PT LONG TERM GOAL #2   Title Patient will demonstrate full pain free cervical AROM for safety with driving and ADLs    Baseline full passive motion but still tight in suboccipitals and levator scapulae restricting full active motion.    Status Partially Met   06/26/21- neck pain end range extension only now, but has good AROM for rotation.     PT LONG TERM GOAL #3   Title Pt. will report 75% improvement in frequency/intensity of neck and back pain.    Baseline Average pain for neck and back is 4/10    Status Achieved      PT LONG TERM GOAL #4   Title Pt. will improve FOTO to at least 50 (lumbar) to demonstrate improved functional mobility    Status Not Met   06/26/21- 43%  reports decreased tolerance to activity due to being taken off of pregabalin.     PT LONG TERM GOAL #5   Title Pt. will be able to lift 25# without increased LBP.    Baseline can lift laundry basket without difficulty (about 10#) feels pain in both neck and back    Status Partially Met   06/26/21- able to lift 18# box from floor to bench without pain.                  Plan - 06/26/21 1133     Clinical Impression Statement Patient reports decreased tolerance to activity and more back pain after being taken off of pregabilin, but neck is doing well.  She demonstrates improved cervical AROM, only pain with end range extension now.  She is still hesitant with lifting, but today after education and demonstration of safe lifting techniques, and warm-up  lifting smaller weighted balls, she was able to lift 18# box from floor to table without low back pain.  Finished session with estim and MHP by request.  Due to insurance, she needs to be discharged today as workman's comp needs to close her account and pay medicaid for current plan of care.  She needs to stop for a month or so for this process.  She will request new order when ready.  She is working with [employer] for [employer], encouraged to participate in water aerobics classes if possible.  Also educated on importance of continuing to be active and perform HEP during lapse in PT services.  Her FOTO has not improved significantly, 43% today, but that is not unexpected due to recent changes in medication and chronic pain.    Stability/Clinical Decision Making Evolving/Moderate complexity    Rehab Potential Good    PT Frequency 2x / week    PT Duration Other (comment)    PT Treatment/Interventions ADLs/Self Care Home Management;Cryotherapy;Electrical Stimulation;Aquatic Therapy;Ultrasound;Moist Heat;Traction;Stair training;Functional mobility training;Therapeutic activities;Therapeutic exercise;Neuromuscular re-education;Passive range of motion;Dry needling;Taping;Spinal Manipulations;Joint Manipulations    PT Next Visit Plan Discharge.    PT Home Exercise Plan [id number]  Added aquatics             Patient will benefit from skilled therapeutic intervention in order to improve the following deficits and impairments:  Decreased activity tolerance, Decreased endurance, Decreased range of motion, Decreased strength, Increased fascial restricitons, Impaired perceived functional ability, Improper body mechanics, Pain, Decreased mobility, Difficulty walking, Increased muscle spasms, Postural dysfunction  Visit Diagnosis: Cervicalgia  Chronic midline low back pain with bilateral sciatica  Muscle weakness (generalized)  Difficulty in walking, not elsewhere classified  Other muscle  spasm     Problem List There are no problems to display for this patient.   Rennie Natter, PT, DPT  06/26/2021, 11:39 AM  [facility] [address]  [place], [place], [address] Phone: [phone]   Fax:  [phone]  Name: Carla Hines MRN: [id number] Date of Birth: [birth year]

## 2021-06-27 ENCOUNTER — Ambulatory Visit: Payer: Medicaid Other | Admitting: Physical Therapy

## 2021-07-03 ENCOUNTER — Encounter: Payer: Medicaid Other | Admitting: Physical Therapy

## 2021-12-05 NOTE — Therapy (Signed)
OUTPATIENT PHYSICAL THERAPY THORACOLUMBAR EVALUATION   Patient Name: Carla Hines MRN: [id number] DOB:1965/06/10, 56 y.o., female Today's Date: 12/09/2021   PT End of Session - 12/09/21 0804     Visit Number 1    Date for PT Re-Evaluation 01/20/22    Authorization Type [employer]    PT Start Time 0804    PT Stop Time 0851    PT Time Calculation (min) 47 min    Activity Tolerance Patient tolerated treatment well    Behavior During Therapy [facility] for tasks assessed/performed             Past Medical History:  Diagnosis Date   Acid reflux    Migraines    Past Surgical History:  Procedure Laterality Date   CERVIX SURGERY     HERNIA REPAIR     There are no problems to display for this patient.   PCP: Oletha Blend, MD  REFERRING PROVIDER: Milly Jakob, MD  REFERRING DIAG: M54.50 (ICD-10-CM) - Low back pain  RATIONALE FOR EVALUATION AND TREATMENT: Rehabilitation  THERAPY DIAG:  Other low back pain  Muscle weakness (generalized)  Muscle spasm of back  Difficulty in walking, not elsewhere classified  ONSET DATE: 01/2020 (work-related injury)  NEXT MD VISIT: unknown   SUBJECTIVE:                                                                                                                                                                                           SUBJECTIVE STATEMENT: Pt report she was initially injured during a workplace incident 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 Had started both land-based and aquatic PT for neck and back pain but her workers comp Sports coach had her stop to close the case, although she had continued to have pain. Last week she started trying to take swim lessons when her pain got significantly worse.  PAIN:  Are you having pain? Yes: NPRS scale: 3/10 Pain location: bilateral low back Pain description: heaviness Aggravating factors: swimming, prolonged sitting or standing/walking,  driving  Relieving factors: Flexeril, lay down  PERTINENT HISTORY: GERD, migraines, cervix surgery, hernia repair, orthostatic hypotension, chronic neck and back pain  PRECAUTIONS: None  WEIGHT BEARING RESTRICTIONS: No  FALLS:  Has patient fallen in last 6 months? No  LIVING ENVIRONMENT: Lives with: lives with their family and lives with their spouse Lives in: House/apartment Stairs: Yes: External: 1 steps; none (basement present but unusable) Has following equipment at home:  walking stick  OCCUPATION: Applying for disability  PLOF: Independent and Leisure: camping 1x/mo, church, walking qod (30-60 min)  PATIENT GOALS: "To be able to get back to walking 30-60 min and be able to get back to being able to learn how to swim."   OBJECTIVE:   DIAGNOSTIC FINDINGS:  05/02/21 - Lumbar Spine CT: IMPRESSION:  1. No acute osseous abnormality.  2. Mild degenerative changes at L4-L5    PATIENT SURVEYS:  FOTO Lumbar = 40; predicted = 50  SCREENING FOR RED FLAGS: Bowel or bladder incontinence: No Spinal tumors: No Cauda equina syndrome: No Compression fracture: No Abdominal aneurysm: No  COGNITION:  Overall cognitive status: Within functional limits for tasks assessed     SENSATION: WFL Intermittent pain, numbness and tingling down L LE  MUSCLE LENGTH: Hamstrings: mild tight B ITB: mild tight B Piriformis: mild tight B Hip flexors: mild tight B Quads: mild tight B  POSTURE:  rounded shoulders, forward head, and increased lumbar lordosis  PALPATION: Increased muscle tension and TTP in B lumbar paraspinals and L>R glutes > piriformis.   Lumbar spine hypomobile with CPAs.  LUMBAR ROM:   Active  AROM  eval  Flexion Hands to ankles - tight  Extension WFL but pain at end ROM  Right lateral flexion WFL  Left lateral flexion WFL  Right rotation WFL  Left rotation WFL   (Blank rows = not tested)  LOWER EXTREMITY ROM:    WFL  LOWER EXTREMITY MMT:    MMT  Right eval Left eval  Hip flexion 4 4-  Hip extension 3+ 3+  Hip abduction 4 4-  Hip adduction 3+ 3+  Hip internal rotation 4+ 4+  Hip external rotation 4- 4-  Knee flexion 4+ 4  Knee extension 4+ 4+  Ankle dorsiflexion 4+ 4  Ankle plantarflexion 4- 7 SLS HR 4- 8 SLS HR  Ankle inversion    Ankle eversion     (Blank rows = not tested)  LUMBAR SPECIAL TESTS:  Straight leg raise test: Positive bilaterally   TODAY'S TREATMENT:  12/09/21 THERAPEUTIC EXERCISE: Instruction in initial HEP (see below) to improve flexibility, strength and mobility.  Verbal and tactile cues throughout for technique.   PATIENT EDUCATION:  Education details: PT eval findings, anticipated POC, and initial HEP Person educated: Patient Education method: Explanation, Demonstration, Verbal cues, and Handouts Education comprehension: verbalized understanding, returned demonstration, verbal cues required, and needs further education   HOME EXERCISE PROGRAM: Access Code: [email or web address] URL: [email or web address] Date: 12/09/2021 Prepared by: Glenetta Hew  Exercises - Seated Flexion Stretch with Swiss Ball  - 2-3 x daily - 7 x weekly - 3 reps - 30 sec hold - Seated Thoracic Flexion and Rotation with Swiss Ball  - 2-3 x daily - 7 x weekly - 3 reps - 30 sec hold - Supine Piriformis Stretch with Foot on Ground  - 2-3 x daily - 7 x weekly - 3 reps - 30 sec hold - Supine Double Knee to Chest Modified  - 2-3 x daily - 7 x weekly - 3 reps - 30 sec hold - Supine Lower Trunk Rotation  - 2-3 x daily - 7 x weekly - 5 reps - 10 sec hold - Supine Posterior Pelvic Tilt  - 2-3 x daily - 7 x weekly - 2 sets - 10 reps - 5 sec hold - Supine Bridge with Mini Swiss Ball Between Knees  - 1 x daily - 7 x weekly - 2 sets - 10 reps - 5 sec hold  ASSESSMENT:  CLINICAL IMPRESSION: Carla Hines is a 56 y.o. female who was seen today for  physical therapy evaluation and treatment for chronic LBP with L LE radiculopathy.  She reports pain originated from a workplace injury in 01/2020 and she complete land-based and aquatic PT from 02/2021 - 06/2021 at [facility] facilities, as well as a prior PT episode with [facility], through worker's comp until it was discontinued by the [facility] case Production designer, theatre/television/film. She continued to have LBP and has been unable to get a job since due to work restrictions related to her pain and hence is in the process of applying for disability. She reports her pain worsening significantly last week while taking swimming lessons and so she returned to her MD who sent her back to PT. Current deficits include LBP with L LE radiculopathy, decreased proximal LE flexibility, abnormal muscle tension and TTP in B lumbar paraspinals and L>R glutes > piriformis, L>R LE weakness most notable proximally, and decreased activity and positional tolerance. Carla Hines will benefit from skilled physical therapy to improve flexibility as well as core/hip/back strength, improve tolerance to activities, decrease pain and muscle spasm in neck and back, and return to activities like walking and swimming for health.   OBJECTIVE IMPAIRMENTS: decreased activity tolerance, decreased knowledge of condition, decreased mobility, difficulty walking, decreased strength, hypomobility, increased fascial restrictions, impaired perceived functional ability, increased muscle spasms, impaired flexibility, impaired sensation, improper body mechanics, postural dysfunction, and pain.   ACTIVITY LIMITATIONS: carrying, lifting, bending, sitting, standing, squatting, stairs, and locomotion level  PARTICIPATION LIMITATIONS: meal prep, cleaning, laundry, driving, and occupation  PERSONAL FACTORS: Past/current experiences, Profession, Time since onset of injury/illness/exacerbation, and 3+ comorbidities: GERD, migraines, cervix surgery, hernia repair, orthostatic hypotension, chronic neck and back pain  are also affecting patient's functional outcome.   REHAB POTENTIAL:  Good  CLINICAL DECISION MAKING: Stable/uncomplicated  EVALUATION COMPLEXITY: Low   GOALS: Goals reviewed with patient? Yes  SHORT TERM GOALS: Target date: 12/30/2021   Patient will be independent with initial HEP to improve outcomes and carryover.  Baseline:  Goal status: INITIAL  2.  Patient will report centralization of radicular symptoms.  Baseline:  Goal status: INITIAL  LONG TERM GOALS: Target date: 01/20/2022    Patient will be independent with ongoing/advanced HEP for self-management at home.  Baseline:  Goal status: INITIAL  2.  Patient will report 50-75% improvement in low back and L LE radicular pain to improve QOL.  Baseline:  Goal status: INITIAL  3.  Patient to demonstrate ability to achieve and maintain good spinal alignment/posturing and body mechanics needed for daily activities. Baseline:  Goal status: INITIAL  4.  Patient will demonstrate full pain free lumbar ROM to perform ADLs.   Baseline:  Goal status: INITIAL  5.  Patient will demonstrate improved B LE strength to >/= 4+/5 for improved stability and ease of mobility . Baseline:  Goal status: INITIAL  6.  Patient will report 50 on lumbar FOTO to demonstrate improved functional ability.  Baseline: 40 Goal status: INITIAL   7.  Patient will tolerate 30+ min of walking to allow her to resume walking for exercise. Baseline:  Goal status: INITIAL   PLAN: PT FREQUENCY: 2x/week  PT DURATION: 6 weeks  PLANNED INTERVENTIONS: Therapeutic exercises, Therapeutic activity, Neuromuscular re-education, Balance training, Gait training, Patient/Family education, Self Care, Joint mobilization, Aquatic Therapy, Dry Needling, Electrical stimulation, Spinal mobilization, Cryotherapy, Moist heat, Taping, Traction, Ultrasound, Manual therapy, and Re-evaluation.  PLAN FOR NEXT SESSION: Review initial HEP, progress lumbopelvic flexibility and strengthening, MT +/- DN and modalities PRN for pain and abnormal muscle  tension  Marry Guan, PT 12/09/2021, 12:35 PM

## 2021-12-09 ENCOUNTER — Other Ambulatory Visit: Payer: Self-pay

## 2021-12-09 ENCOUNTER — Encounter: Payer: Self-pay | Admitting: Physical Therapy

## 2021-12-09 ENCOUNTER — Ambulatory Visit: Payer: Medicaid Other | Attending: Orthopedic Surgery | Admitting: Physical Therapy

## 2021-12-09 DIAGNOSIS — M5459 Other low back pain: Secondary | ICD-10-CM | POA: Insufficient documentation

## 2021-12-09 DIAGNOSIS — M6281 Muscle weakness (generalized): Secondary | ICD-10-CM | POA: Diagnosis present

## 2021-12-09 DIAGNOSIS — M62830 Muscle spasm of back: Secondary | ICD-10-CM | POA: Diagnosis present

## 2021-12-09 DIAGNOSIS — R262 Difficulty in walking, not elsewhere classified: Secondary | ICD-10-CM | POA: Insufficient documentation

## 2021-12-12 ENCOUNTER — Ambulatory Visit: Payer: Medicaid Other

## 2021-12-12 DIAGNOSIS — M62830 Muscle spasm of back: Secondary | ICD-10-CM

## 2021-12-12 DIAGNOSIS — M5459 Other low back pain: Secondary | ICD-10-CM

## 2021-12-12 DIAGNOSIS — M6281 Muscle weakness (generalized): Secondary | ICD-10-CM

## 2021-12-12 DIAGNOSIS — R262 Difficulty in walking, not elsewhere classified: Secondary | ICD-10-CM

## 2021-12-12 NOTE — Therapy (Signed)
OUTPATIENT PHYSICAL THERAPY TREATMENT   Patient Name: Carla Hines MRN: [id number] DOB:[birth year], 56 y.o., female Today's Date: 12/12/2021   PT End of Session - 12/12/21 1702     Visit Number 2    Date for PT Re-Evaluation 01/20/22    Authorization Type [employer]    PT Start Time 1618    PT Stop Time 1700    PT Time Calculation (min) 42 min    Activity Tolerance Patient tolerated treatment well    Behavior During Therapy WFL for tasks assessed/performed              Past Medical History:  Diagnosis Date   Acid reflux    Migraines    Past Surgical History:  Procedure Laterality Date   CERVIX SURGERY     HERNIA REPAIR     There are no problems to display for this patient.   PCP: Oletha Blend, MD  REFERRING PROVIDER: Milly Jakob, MD  REFERRING DIAG: M54.50 (ICD-10-CM) - Low back pain  RATIONALE FOR EVALUATION AND TREATMENT: Rehabilitation  THERAPY DIAG:  Other low back pain  Muscle weakness (generalized)  Muscle spasm of back  Difficulty in walking, not elsewhere classified  ONSET DATE: 01/2020 (work-related injury)  NEXT MD VISIT: unknown   SUBJECTIVE:                                                                                                                                                                                           SUBJECTIVE STATEMENT: Took pain meds so the pain is better  PAIN:  Are you having pain? Yes: NPRS scale: 4/10 Pain location: bilateral low back Pain description: heaviness Aggravating factors: swimming, prolonged sitting or standing/walking, driving  Relieving factors: Flexeril, lay down  PERTINENT HISTORY: GERD, migraines, cervix surgery, hernia repair, orthostatic hypotension, chronic neck and back pain  PRECAUTIONS: None  WEIGHT BEARING RESTRICTIONS: No  FALLS:  Has patient fallen in last 6 months? No  LIVING ENVIRONMENT: Lives with: lives with their family and lives with their  spouse Lives in: House/apartment Stairs: Yes: External: 1 steps; none (basement present but unusable) Has following equipment at home:  walking stick  OCCUPATION: Applying for disability  PLOF: Independent and Leisure: camping 1x/mo, church, walking qod (30-60 min)     PATIENT GOALS: "To be able to get back to walking 30-60 min and be able to get back to being able to learn how to swim."   OBJECTIVE:   DIAGNOSTIC FINDINGS:  05/02/21 - Lumbar Spine CT: IMPRESSION:  1. No acute osseous abnormality.  2. Mild degenerative changes at L4-L5    PATIENT SURVEYS:  FOTO Lumbar = 40; predicted = 50  SCREENING FOR RED FLAGS: Bowel or bladder incontinence: No Spinal tumors: No Cauda equina syndrome: No Compression fracture: No Abdominal aneurysm: No  COGNITION:  Overall cognitive status: Within functional limits for tasks assessed     SENSATION: WFL Intermittent pain, numbness and tingling down L LE  MUSCLE LENGTH: Hamstrings: mild tight B ITB: mild tight B Piriformis: mild tight B Hip flexors: mild tight B Quads: mild tight B  POSTURE:  rounded shoulders, forward head, and increased lumbar lordosis  PALPATION: Increased muscle tension and TTP in B lumbar paraspinals and L>R glutes > piriformis.   Lumbar spine hypomobile with CPAs.  LUMBAR ROM:   Active  AROM  eval  Flexion Hands to ankles - tight  Extension WFL but pain at end ROM  Right lateral flexion WFL  Left lateral flexion WFL  Right rotation WFL  Left rotation WFL   (Blank rows = not tested)  LOWER EXTREMITY ROM:    WFL  LOWER EXTREMITY MMT:    MMT Right eval Left eval  Hip flexion 4 4-  Hip extension 3+ 3+  Hip abduction 4 4-  Hip adduction 3+ 3+  Hip internal rotation 4+ 4+  Hip external rotation 4- 4-  Knee flexion 4+ 4  Knee extension 4+ 4+  Ankle dorsiflexion 4+ 4  Ankle plantarflexion 4- 7 SLS HR 4- 8 SLS HR  Ankle inversion    Ankle eversion     (Blank rows = not tested)  LUMBAR  SPECIAL TESTS:  Straight leg raise test: Positive bilaterally   TODAY'S TREATMENT: 12/12/21 Treadmill 0.8 mph 7 min no incline Lat pull 15# 2x10 Knee flexion 10# x 10 Knee extension 5# x 10 Seated pallof press w/ red TB x 10 Seated row w/ red TB x 10 Seated extension w/ red TB x 10 Seated ball squeeze w/ PPT 10x5" Seated diagonals w/ green light ball x 10 bil  12/09/21 THERAPEUTIC EXERCISE: Instruction in initial HEP (see below) to improve flexibility, strength and mobility.  Verbal and tactile cues throughout for technique.   PATIENT EDUCATION:  Education details: PT eval findings, anticipated POC, and initial HEP Person educated: Patient Education method: Explanation, Demonstration, Verbal cues, and Handouts Education comprehension: verbalized understanding, returned demonstration, verbal cues required, and needs further education   HOME EXERCISE PROGRAM: Access Code: [email or web address] URL: [email or web address] Date: 12/09/2021 Prepared by: Glenetta Hew  Exercises - Seated Flexion Stretch with Swiss Ball  - 2-3 x daily - 7 x weekly - 3 reps - 30 sec hold - Seated Thoracic Flexion and Rotation with Swiss Ball  - 2-3 x daily - 7 x weekly - 3 reps - 30 sec hold - Supine Piriformis Stretch with Foot on Ground  - 2-3 x daily - 7 x weekly - 3 reps - 30 sec hold - Supine Double Knee to Chest Modified  - 2-3 x daily - 7 x weekly - 3 reps - 30 sec hold - Supine Lower Trunk Rotation  - 2-3 x daily - 7 x weekly - 5 reps - 10 sec hold - Supine Posterior Pelvic Tilt  - 2-3 x daily - 7 x weekly - 2 sets - 10 reps - 5 sec hold - Supine Bridge with Mini Swiss Ball Between Knees  - 1 x daily - 7 x weekly - 2 sets - 10 reps - 5 sec hold  ASSESSMENT:  CLINICAL IMPRESSION: Advanced pt through exercises focusing on core stability, postural alignment,and general strengthening.  Cues provided as needed with exercises to correct form and for postural stability. She did report some pulling in  L neck w/ diagonals and pallof press but no further complaints.   OBJECTIVE IMPAIRMENTS: decreased activity tolerance, decreased knowledge of condition, decreased mobility, difficulty walking, decreased strength, hypomobility, increased fascial restrictions, impaired perceived functional ability, increased muscle spasms, impaired flexibility, impaired sensation, improper body mechanics, postural dysfunction, and pain.   ACTIVITY LIMITATIONS: carrying, lifting, bending, sitting, standing, squatting, stairs, and locomotion level  PARTICIPATION LIMITATIONS: meal prep, cleaning, laundry, driving, and occupation  PERSONAL FACTORS: Past/current experiences, Profession, Time since onset of injury/illness/exacerbation, and 3+ comorbidities: GERD, migraines, cervix surgery, hernia repair, orthostatic hypotension, chronic neck and back pain  are also affecting patient's functional outcome.   REHAB POTENTIAL: Good  CLINICAL DECISION MAKING: Stable/uncomplicated  EVALUATION COMPLEXITY: Low   GOALS: Goals reviewed with patient? Yes  SHORT TERM GOALS: Target date: 12/30/2021   Patient will be independent with initial HEP to improve outcomes and carryover.  Baseline:  Goal status: INITIAL  2.  Patient will report centralization of radicular symptoms.  Baseline:  Goal status: INITIAL  LONG TERM GOALS: Target date: 01/20/2022    Patient will be independent with ongoing/advanced HEP for self-management at home.  Baseline:  Goal status: INITIAL  2.  Patient will report 50-75% improvement in low back and L LE radicular pain to improve QOL.  Baseline:  Goal status: INITIAL  3.  Patient to demonstrate ability to achieve and maintain good spinal alignment/posturing and body mechanics needed for daily activities. Baseline:  Goal status: INITIAL  4.  Patient will demonstrate full pain free lumbar ROM to perform ADLs.   Baseline:  Goal status: INITIAL  5.  Patient will demonstrate improved B LE  strength to >/= 4+/5 for improved stability and ease of mobility . Baseline:  Goal status: INITIAL  6.  Patient will report 50 on lumbar FOTO to demonstrate improved functional ability.  Baseline: 40 Goal status: INITIAL   7.  Patient will tolerate 30+ min of walking to allow her to resume walking for exercise. Baseline:  Goal status: INITIAL   PLAN: PT FREQUENCY: 2x/week  PT DURATION: 6 weeks  PLANNED INTERVENTIONS: Therapeutic exercises, Therapeutic activity, Neuromuscular re-education, Balance training, Gait training, Patient/Family education, Self Care, Joint mobilization, Aquatic Therapy, Dry Needling, Electrical stimulation, Spinal mobilization, Cryotherapy, Moist heat, Taping, Traction, Ultrasound, Manual therapy, and Re-evaluation.  PLAN FOR NEXT SESSION: try weight machines and general strengthening; progress lumbopelvic flexibility and strengthening, MT +/- DN and modalities PRN for pain and abnormal muscle tension   Lamesha Tibbits L Wyoma Genson, PTA 12/12/2021, 5:06 PM

## 2021-12-17 ENCOUNTER — Ambulatory Visit: Payer: Medicaid Other

## 2021-12-17 DIAGNOSIS — M5459 Other low back pain: Secondary | ICD-10-CM

## 2021-12-17 DIAGNOSIS — M6281 Muscle weakness (generalized): Secondary | ICD-10-CM

## 2021-12-17 DIAGNOSIS — R262 Difficulty in walking, not elsewhere classified: Secondary | ICD-10-CM

## 2021-12-17 DIAGNOSIS — M62830 Muscle spasm of back: Secondary | ICD-10-CM

## 2021-12-17 NOTE — Therapy (Signed)
OUTPATIENT PHYSICAL THERAPY TREATMENT   Patient Name: Carla Hines MRN: [id number] DOB:Sep 02, 1965, 56 y.o., female Today's Date: 12/17/2021   PT End of Session - 12/17/21 1756     Visit Number 3    Date for PT Re-Evaluation 01/20/22    Authorization Type [employer]    PT Start Time 1702    PT Stop Time 1745    PT Time Calculation (min) 43 min    Activity Tolerance Patient tolerated treatment well    Behavior During Therapy WFL for tasks assessed/performed               Past Medical History:  Diagnosis Date   Acid reflux    Migraines    Past Surgical History:  Procedure Laterality Date   CERVIX SURGERY     HERNIA REPAIR     There are no problems to display for this patient.   PCP: Oletha Blend, MD  REFERRING PROVIDER: Milly Jakob, MD  REFERRING DIAG: M54.50 (ICD-10-CM) - Low back pain  RATIONALE FOR EVALUATION AND TREATMENT: Rehabilitation  THERAPY DIAG:  Other low back pain  Muscle weakness (generalized)  Muscle spasm of back  Difficulty in walking, not elsewhere classified  ONSET DATE: 01/2020 (work-related injury)  NEXT MD VISIT: unknown   SUBJECTIVE:                                                                                                                                                                                           SUBJECTIVE STATEMENT: Pt reports she is feeling better, feels like now that she is getting into more exercise she is doing better.  PAIN:  Are you having pain? Yes: NPRS scale: 2/10 Pain location: bilateral low back Pain description: heaviness Aggravating factors: swimming, prolonged sitting or standing/walking, driving  Relieving factors: Flexeril, lay down  PERTINENT HISTORY: GERD, migraines, cervix surgery, hernia repair, orthostatic hypotension, chronic neck and back pain  PRECAUTIONS: None  WEIGHT BEARING RESTRICTIONS: No  FALLS:  Has patient fallen in last 6 months? No  LIVING  ENVIRONMENT: Lives with: lives with their family and lives with their spouse Lives in: House/apartment Stairs: Yes: External: 1 steps; none (basement present but unusable) Has following equipment at home:  walking stick  OCCUPATION: Applying for disability  PLOF: Independent and Leisure: camping 1x/mo, church, walking qod (30-60 min)     PATIENT GOALS: "To be able to get back to walking 30-60 min and be able to get back to being able to learn how to swim."   OBJECTIVE:   DIAGNOSTIC FINDINGS:  05/02/21 - Lumbar Spine CT: IMPRESSION:  1. No acute osseous abnormality.  2. Mild degenerative changes at L4-L5    PATIENT SURVEYS:  FOTO Lumbar = 40; predicted = 50  SCREENING FOR RED FLAGS: Bowel or bladder incontinence: No Spinal tumors: No Cauda equina syndrome: No Compression fracture: No Abdominal aneurysm: No  COGNITION:  Overall cognitive status: Within functional limits for tasks assessed     SENSATION: WFL Intermittent pain, numbness and tingling down L LE  MUSCLE LENGTH: Hamstrings: mild tight B ITB: mild tight B Piriformis: mild tight B Hip flexors: mild tight B Quads: mild tight B  POSTURE:  rounded shoulders, forward head, and increased lumbar lordosis  PALPATION: Increased muscle tension and TTP in B lumbar paraspinals and L>R glutes > piriformis.   Lumbar spine hypomobile with CPAs.  LUMBAR ROM:   Active  AROM  eval  Flexion Hands to ankles - tight  Extension WFL but pain at end ROM  Right lateral flexion WFL  Left lateral flexion WFL  Right rotation WFL  Left rotation WFL   (Blank rows = not tested)  LOWER EXTREMITY ROM:    WFL  LOWER EXTREMITY MMT:    MMT Right eval Left eval  Hip flexion 4 4-  Hip extension 3+ 3+  Hip abduction 4 4-  Hip adduction 3+ 3+  Hip internal rotation 4+ 4+  Hip external rotation 4- 4-  Knee flexion 4+ 4  Knee extension 4+ 4+  Ankle dorsiflexion 4+ 4  Ankle plantarflexion 4- 7 SLS HR 4- 8 SLS HR  Ankle  inversion    Ankle eversion     (Blank rows = not tested)  LUMBAR SPECIAL TESTS:  Straight leg raise test: Positive bilaterally   TODAY'S TREATMENT: 12/17/21 Treadmill 0.9 7 min no incline Standing lat pull 15# 2x10 Leg press 15# x 10 Seated chop w/ green weight ball x 10 bil Seated chest press w/ green weight ball x 10 Seated deadlift (hip hinge) x 10 - cues to avoid lumbar hyperextension Standing lumbar flexion stretch at sink 5x5" LTR x 10 bil Supine bil piriformis figure 4 stretch pulling opp LE up 2x15"  12/12/21 Treadmill 0.8 mph 7 min no incline Lat pull 15# 2x10 Knee flexion 10# x 10 Knee extension 5# x 10 Seated pallof press w/ red TB x 10 Seated row w/ red TB x 10 Seated extension w/ red TB x 10 Seated ball squeeze w/ PPT 10x5" Seated diagonals w/ green light ball x 10 bil  12/09/21 THERAPEUTIC EXERCISE: Instruction in initial HEP (see below) to improve flexibility, strength and mobility.  Verbal and tactile cues throughout for technique.   PATIENT EDUCATION:  Education details: PT eval findings, anticipated POC, and initial HEP Person educated: Patient Education method: Explanation, Demonstration, Verbal cues, and Handouts Education comprehension: verbalized understanding, returned demonstration, verbal cues required, and needs further education   HOME EXERCISE PROGRAM: Access Code: [email or web address] URL: [email or web address] Date: 12/09/2021 Prepared by: Glenetta Hew  Exercises - Seated Flexion Stretch with Swiss Ball  - 2-3 x daily - 7 x weekly - 3 reps - 30 sec hold - Seated Thoracic Flexion and Rotation with Swiss Ball  - 2-3 x daily - 7 x weekly - 3 reps - 30 sec hold - Supine Piriformis Stretch with Foot on Ground  - 2-3 x daily - 7 x weekly - 3 reps - 30 sec hold - Supine Double Knee to Chest Modified  - 2-3 x daily - 7 x weekly - 3 reps - 30 sec hold - Supine Lower Trunk Rotation  - 2-3  x daily - 7 x weekly - 5 reps - 10 sec hold - Supine  Posterior Pelvic Tilt  - 2-3 x daily - 7 x weekly - 2 sets - 10 reps - 5 sec hold - Supine Bridge with Mini Swiss Ball Between Knees  - 1 x daily - 7 x weekly - 2 sets - 10 reps - 5 sec hold  ASSESSMENT:  CLINICAL IMPRESSION: Pt showed a good tolerance for the progression of exercises today. Cues to avoid hyperextension of spine with standing lat pull and hip hinges. Progressed piriformis stretch in supine to allow for greater stretch. She continues to do well with general strengthening while emphasizing core stability.  OBJECTIVE IMPAIRMENTS: decreased activity tolerance, decreased knowledge of condition, decreased mobility, difficulty walking, decreased strength, hypomobility, increased fascial restrictions, impaired perceived functional ability, increased muscle spasms, impaired flexibility, impaired sensation, improper body mechanics, postural dysfunction, and pain.   ACTIVITY LIMITATIONS: carrying, lifting, bending, sitting, standing, squatting, stairs, and locomotion level  PARTICIPATION LIMITATIONS: meal prep, cleaning, laundry, driving, and occupation  PERSONAL FACTORS: Past/current experiences, Profession, Time since onset of injury/illness/exacerbation, and 3+ comorbidities: GERD, migraines, cervix surgery, hernia repair, orthostatic hypotension, chronic neck and back pain  are also affecting patient's functional outcome.   REHAB POTENTIAL: Good  CLINICAL DECISION MAKING: Stable/uncomplicated  EVALUATION COMPLEXITY: Low   GOALS: Goals reviewed with patient? Yes  SHORT TERM GOALS: Target date: 12/30/2021   Patient will be independent with initial HEP to improve outcomes and carryover.  Baseline:  Goal status: IN PROGRESS  2.  Patient will report centralization of radicular symptoms.  Baseline:  Goal status: IN PROGRESS  LONG TERM GOALS: Target date: 01/20/2022    Patient will be independent with ongoing/advanced HEP for self-management at home.  Baseline:  Goal status:  IN PROGRESS  2.  Patient will report 50-75% improvement in low back and L LE radicular pain to improve QOL.  Baseline:  Goal status: IN PROGRESS  3.  Patient to demonstrate ability to achieve and maintain good spinal alignment/posturing and body mechanics needed for daily activities. Baseline:  Goal status: IN PROGRESS  4.  Patient will demonstrate full pain free lumbar ROM to perform ADLs.   Baseline:  Goal status: IN PROGRESS  5.  Patient will demonstrate improved B LE strength to >/= 4+/5 for improved stability and ease of mobility . Baseline:  Goal status: IN PROGRESS  6.  Patient will report 50 on lumbar FOTO to demonstrate improved functional ability.  Baseline: 40 Goal status: IN PROGRESS   7.  Patient will tolerate 30+ min of walking to allow her to resume walking for exercise. Baseline:  Goal status: IN PROGRESS   PLAN: PT FREQUENCY: 2x/week  PT DURATION: 6 weeks  PLANNED INTERVENTIONS: Therapeutic exercises, Therapeutic activity, Neuromuscular re-education, Balance training, Gait training, Patient/Family education, Self Care, Joint mobilization, Aquatic Therapy, Dry Needling, Electrical stimulation, Spinal mobilization, Cryotherapy, Moist heat, Taping, Traction, Ultrasound, Manual therapy, and Re-evaluation.  PLAN FOR NEXT SESSION: add row/extensions w/ TB; try weight machines and general strengthening; progress lumbopelvic flexibility and strengthening, MT +/- DN and modalities PRN for pain and abnormal muscle tension   Shadara Lopez L Keoshia Steinmetz, PTA 12/17/2021, 5:56 PM

## 2021-12-20 ENCOUNTER — Encounter: Payer: Self-pay | Admitting: Physical Therapy

## 2021-12-20 ENCOUNTER — Ambulatory Visit: Payer: Medicaid Other | Admitting: Physical Therapy

## 2021-12-20 DIAGNOSIS — M6281 Muscle weakness (generalized): Secondary | ICD-10-CM

## 2021-12-20 DIAGNOSIS — R262 Difficulty in walking, not elsewhere classified: Secondary | ICD-10-CM

## 2021-12-20 DIAGNOSIS — M5459 Other low back pain: Secondary | ICD-10-CM

## 2021-12-20 DIAGNOSIS — M62830 Muscle spasm of back: Secondary | ICD-10-CM

## 2021-12-20 NOTE — Therapy (Signed)
OUTPATIENT PHYSICAL THERAPY TREATMENT   Patient Name: Carla Hines MRN: [id number] DOB:26-Aug-1965, 56 y.o., female Today's Date: 12/20/2021   PT End of Session - 12/20/21 0803     Visit Number 4    Date for PT Re-Evaluation 01/20/22    Authorization Type [employer]    PT Start Time 0803    PT Stop Time 0845    PT Time Calculation (min) 42 min    Activity Tolerance Patient tolerated treatment well    Behavior During Therapy [facility] for tasks assessed/performed                Past Medical History:  Diagnosis Date   Acid reflux    Migraines    Past Surgical History:  Procedure Laterality Date   CERVIX SURGERY     HERNIA REPAIR     There are no problems to display for this patient.   PCP: Leota Jacobsen, MD  REFERRING PROVIDER: Lowella Petties, MD  REFERRING DIAG: M54.50 (ICD-10-CM) - Low back pain  RATIONALE FOR EVALUATION AND TREATMENT: Rehabilitation  THERAPY DIAG:  Other low back pain  Muscle weakness (generalized)  Muscle spasm of back  Difficulty in walking, not elsewhere classified  ONSET DATE: 01/2020 (work-related injury)  NEXT MD VISIT: unknown   SUBJECTIVE:                                                                                                                                                                                           SUBJECTIVE STATEMENT: Pt reports good comfort with HEP and denies need for review - notes benefit from the stretches especially.  PAIN:  Are you having pain? Yes: NPRS scale:  3/10 Pain location: bilateral low back Pain description: heaviness  PERTINENT HISTORY: GERD, migraines, cervix surgery, hernia repair, orthostatic hypotension, chronic neck and back pain  PRECAUTIONS: None  WEIGHT BEARING RESTRICTIONS: No  FALLS:  Has patient fallen in last 6 months? No  LIVING ENVIRONMENT: Lives with: lives with their family and lives with their spouse Lives in: House/apartment Stairs: Yes:  External: 1 steps; none (basement present but unusable) Has following equipment at home:  walking stick  OCCUPATION: Applying for disability  PLOF: Independent and Leisure: camping 1x/mo, church, walking qod (30-60 min)     PATIENT GOALS: "To be able to get back to walking 30-60 min and be able to get back to being able to learn how to swim."   OBJECTIVE:   DIAGNOSTIC FINDINGS:  05/02/21 - Lumbar Spine CT: IMPRESSION:  1. No acute osseous abnormality.  2. Mild degenerative changes at L4-L5    PATIENT SURVEYS:  FOTO  Lumbar = 40; predicted = 50  SCREENING FOR RED FLAGS: Bowel or bladder incontinence: No Spinal tumors: No Cauda equina syndrome: No Compression fracture: No Abdominal aneurysm: No  COGNITION:  Overall cognitive status: Within functional limits for tasks assessed     SENSATION: WFL Intermittent pain, numbness and tingling down L LE  MUSCLE LENGTH: Hamstrings: mild tight B ITB: mild tight B Piriformis: mild tight B Hip flexors: mild tight B Quads: mild tight B  POSTURE:  rounded shoulders, forward head, and increased lumbar lordosis  PALPATION: Increased muscle tension and TTP in B lumbar paraspinals and L>R glutes > piriformis.   Lumbar spine hypomobile with CPAs.  LUMBAR ROM:   Active  AROM  eval  Flexion Hands to ankles - tight  Extension WFL but pain at end ROM  Right lateral flexion WFL  Left lateral flexion WFL  Right rotation WFL  Left rotation WFL   (Blank rows = not tested)  LOWER EXTREMITY ROM:    WFL  LOWER EXTREMITY MMT:    MMT Right eval Left eval  Hip flexion 4 4-  Hip extension 3+ 3+  Hip abduction 4 4-  Hip adduction 3+ 3+  Hip internal rotation 4+ 4+  Hip external rotation 4- 4-  Knee flexion 4+ 4  Knee extension 4+ 4+  Ankle dorsiflexion 4+ 4  Ankle plantarflexion 4- 7 SLS HR 4- 8 SLS HR  Ankle inversion    Ankle eversion     (Blank rows = not tested)  LUMBAR SPECIAL TESTS:  Straight leg raise test:  Positive bilaterally   TODAY'S TREATMENT:  12/20/21 THERAPEUTIC EXERCISE: to improve flexibility, strength and mobility.  Verbal and tactile cues throughout for technique. Rec bike - L2 x 68mn Treadmill - 0.8 mph x 5 min, no incline BATCA low row 15# 2 x 10  SELF CARE: Provided education in proper posture and body mechanics for typical daily positioning and household chores to minimize strain on low back and neck.   12/17/21 Treadmill 0.9 7 min no incline Standing lat pull 15# 2x10 Leg press 15# x 10 Seated chop w/ green weight ball x 10 bil Seated chest press w/ green weight ball x 10 Seated deadlift (hip hinge) x 10 - cues to avoid lumbar hyperextension Standing lumbar flexion stretch at sink 5x5" LTR x 10 bil Supine bil piriformis figure 4 stretch pulling opp LE up 2x15"   12/12/21 Treadmill 0.8 mph 7 min no incline Lat pull 15# 2x10 Knee flexion 10# x 10 Knee extension 5# x 10 Seated pallof press w/ red TB x 10 Seated row w/ red TB x 10 Seated extension w/ red TB x 10 Seated ball squeeze w/ PPT 10x5" Seated diagonals w/ green light ball x 10 bil   PATIENT EDUCATION:  Education details: postural awareness and posture and body mechanics for typical daily postioning, mobility and household tasks Person educated: Patient Education method: EConsulting civil engineer DMedia planner Verbal cues, and Handouts Education comprehension: verbalized understanding   HOME EXERCISE PROGRAM: Access Code: [email or web address]: [email or web address] Date: 12/20/2021 Prepared by: JAnnie Paras Exercises - Seated Flexion Stretch with Swiss Ball  - 2-3 x daily - 7 x weekly - 3 reps - 30 sec hold - Seated Thoracic Flexion and Rotation with Swiss Ball  - 2-3 x daily - 7 x weekly - 3 reps - 30 sec hold - Supine Figure 4 Piriformis Stretch  - 2 x daily - 7 x weekly - 2 sets - 2 reps - 30 sec hold -  Supine Double Knee to Chest Modified  - 2-3 x daily - 7 x weekly - 3 reps - 30 sec hold -  Supine Lower Trunk Rotation  - 2-3 x daily - 7 x weekly - 5 reps - 10 sec hold - Supine Bridge with Mini Swiss Ball Between Knees  - 1 x daily - 7 x weekly - 2 sets - 10 reps - 5 sec hold - Seated Diagonal Chop with Medicine Ball  - 1 x daily - 3 x weekly - 2 sets - 10 reps  Patient Education - Posture and Body Mechanics  ASSESSMENT:  CLINICAL IMPRESSION: Addilee feels the the HEP exercises have been helping and denies any issues or need for further review at this time - STG #1 met. She notes she is still triggered by activities where she is bending forward, therefore education provided on proper posture and body mechanics for typical daily positioning, mobility, household chores and school-related tasks to reduce strain on low back, providing elaboration based on pt's more specific triggers such as washing dishes and picking things up. Will continue emphasis on core and general LE strengthening in upcoming visits.  OBJECTIVE IMPAIRMENTS: decreased activity tolerance, decreased knowledge of condition, decreased mobility, difficulty walking, decreased strength, hypomobility, increased fascial restrictions, impaired perceived functional ability, increased muscle spasms, impaired flexibility, impaired sensation, improper body mechanics, postural dysfunction, and pain.   ACTIVITY LIMITATIONS: carrying, lifting, bending, sitting, standing, squatting, stairs, and locomotion level  PARTICIPATION LIMITATIONS: meal prep, cleaning, laundry, driving, and occupation  PERSONAL FACTORS: Past/current experiences, Profession, Time since onset of injury/illness/exacerbation, and 3+ comorbidities: GERD, migraines, cervix surgery, hernia repair, orthostatic hypotension, chronic neck and back pain  are also affecting patient's functional outcome.   REHAB POTENTIAL: Good  CLINICAL DECISION MAKING: Stable/uncomplicated  EVALUATION COMPLEXITY: Low   GOALS: Goals reviewed with patient? Yes  SHORT TERM GOALS:  Target date: 12/30/2021   Patient will be independent with initial HEP to improve outcomes and carryover.  Baseline:  Goal status: MET  12/20/21  2.  Patient will report centralization of radicular symptoms.  Baseline:  Goal status: IN PROGRESS  LONG TERM GOALS: Target date: 01/20/2022    Patient will be independent with ongoing/advanced HEP for self-management at home.  Baseline:  Goal status: IN PROGRESS  2.  Patient will report 50-75% improvement in low back and L LE radicular pain to improve QOL.  Baseline:  Goal status: IN PROGRESS  3.  Patient to demonstrate ability to achieve and maintain good spinal alignment/posturing and body mechanics needed for daily activities. Baseline:  Goal status: IN PROGRESS  12/20/21 - education provided  4.  Patient will demonstrate full pain free lumbar ROM to perform ADLs.   Baseline:  Goal status: IN PROGRESS  5.  Patient will demonstrate improved B LE strength to >/= 4+/5 for improved stability and ease of mobility . Baseline:  Goal status: IN PROGRESS  6.  Patient will report 50 on lumbar FOTO to demonstrate improved functional ability.  Baseline: 40 Goal status: IN PROGRESS   7.  Patient will tolerate 30+ min of walking to allow her to resume walking for exercise. Baseline:  Goal status: IN PROGRESS   PLAN: PT FREQUENCY: 2x/week  PT DURATION: 6 weeks  PLANNED INTERVENTIONS: Therapeutic exercises, Therapeutic activity, Neuromuscular re-education, Balance training, Gait training, Patient/Family education, Self Care, Joint mobilization, Aquatic Therapy, Dry Needling, Electrical stimulation, Spinal mobilization, Cryotherapy, Moist heat, Taping, Traction, Ultrasound, Manual therapy, and Re-evaluation.  PLAN FOR NEXT SESSION: add row/extensions  w/ TB; try weight machines and general strengthening; progress lumbopelvic flexibility and strengthening, MT +/- DN and modalities PRN for pain and abnormal muscle tension; review posture and  body mechanics training as indicated   Percival Spanish, PT 12/20/2021, 12:44 PM

## 2021-12-23 ENCOUNTER — Encounter: Payer: Self-pay | Admitting: Physical Therapy

## 2021-12-23 ENCOUNTER — Ambulatory Visit: Payer: Medicaid Other | Admitting: Physical Therapy

## 2021-12-23 DIAGNOSIS — M62830 Muscle spasm of back: Secondary | ICD-10-CM

## 2021-12-23 DIAGNOSIS — M5459 Other low back pain: Secondary | ICD-10-CM | POA: Diagnosis not present

## 2021-12-23 DIAGNOSIS — M6281 Muscle weakness (generalized): Secondary | ICD-10-CM

## 2021-12-23 DIAGNOSIS — R262 Difficulty in walking, not elsewhere classified: Secondary | ICD-10-CM

## 2021-12-23 NOTE — Therapy (Signed)
OUTPATIENT PHYSICAL THERAPY TREATMENT   Patient Name: Carla Hines MRN: [id number] DOB:December 25, 1965, 56 y.o., female Today's Date: 12/23/2021   PT End of Session - 12/23/21 0945     Visit Number 5    Date for PT Re-Evaluation 01/20/22    Authorization Type [employer]    PT Start Time 0945   Pt arrived late   PT Stop Time 1047    PT Time Calculation (min) 62 min    Activity Tolerance Patient tolerated treatment well    Behavior During Therapy WFL for tasks assessed/performed                 Past Medical History:  Diagnosis Date   Acid reflux    Migraines    Past Surgical History:  Procedure Laterality Date   CERVIX SURGERY     HERNIA REPAIR     There are no problems to display for this patient.   PCP: Leota Jacobsen, MD  REFERRING PROVIDER: Lowella Petties, MD  REFERRING DIAG: M54.50 (ICD-10-CM) - Low back pain  RATIONALE FOR EVALUATION AND TREATMENT: Rehabilitation  THERAPY DIAG:  Other low back pain  Muscle weakness (generalized)  Muscle spasm of back  Difficulty in walking, not elsewhere classified  ONSET DATE: 01/2020 (work-related injury)  NEXT MD VISIT: unknown   SUBJECTIVE:                                                                                                                                                                                           SUBJECTIVE STATEMENT: Pt reports more midline LBP today, initially at 3/10 this morning but increasing. Has not slept well the past 2 nights.   PAIN:  Are you having pain? Yes: NPRS scale:  5/10 Pain location: midline low back Pain description: stabbing  PERTINENT HISTORY: GERD, migraines, cervix surgery, hernia repair, orthostatic hypotension, chronic neck and back pain  PRECAUTIONS: None  WEIGHT BEARING RESTRICTIONS: No  FALLS:  Has patient fallen in last 6 months? No  LIVING ENVIRONMENT: Lives with: lives with their family and lives with their spouse Lives in:  House/apartment Stairs: Yes: External: 1 steps; none (basement present but unusable) Has following equipment at home:  walking stick  OCCUPATION: Applying for disability  PLOF: Independent and Leisure: camping 1x/mo, church, walking qod (30-60 min)     PATIENT GOALS: "To be able to get back to walking 30-60 min and be able to get back to being able to learn how to swim."   OBJECTIVE:   DIAGNOSTIC FINDINGS:  05/02/21 - Lumbar Spine CT: IMPRESSION:  1. No acute osseous abnormality.  2. Mild degenerative changes  at L4-L5    PATIENT SURVEYS:  FOTO Lumbar = 40; predicted = 50  SCREENING FOR RED FLAGS: Bowel or bladder incontinence: No Spinal tumors: No Cauda equina syndrome: No Compression fracture: No Abdominal aneurysm: No  COGNITION:  Overall cognitive status: Within functional limits for tasks assessed     SENSATION: WFL Intermittent pain, numbness and tingling down L LE  MUSCLE LENGTH: Hamstrings: mild tight B ITB: mild tight B Piriformis: mild tight B Hip flexors: mild tight B Quads: mild tight B  POSTURE:  rounded shoulders, forward head, and increased lumbar lordosis  PALPATION: Increased muscle tension and TTP in B lumbar paraspinals and L>R glutes > piriformis.   Lumbar spine hypomobile with CPAs.  LUMBAR ROM:   Active  AROM  eval  Flexion Hands to ankles - tight  Extension WFL but pain at end ROM  Right lateral flexion WFL  Left lateral flexion WFL  Right rotation WFL  Left rotation WFL   (Blank rows = not tested)  LOWER EXTREMITY ROM:    WFL  LOWER EXTREMITY MMT:    MMT Right eval Left eval  Hip flexion 4 4-  Hip extension 3+ 3+  Hip abduction 4 4-  Hip adduction 3+ 3+  Hip internal rotation 4+ 4+  Hip external rotation 4- 4-  Knee flexion 4+ 4  Knee extension 4+ 4+  Ankle dorsiflexion 4+ 4  Ankle plantarflexion 4- 7 SLS HR 4- 8 SLS HR  Ankle inversion    Ankle eversion     (Blank rows = not tested)  LUMBAR SPECIAL TESTS:   Straight leg raise test: Positive bilaterally   TODAY'S TREATMENT:  12/23/21 THERAPEUTIC EXERCISE: to improve flexibility, strength and mobility.  Verbal and tactile cues throughout for technique. Treadmill - 0.8 mph x 8 min, no incline - pt noting pain into L hip afterward, but denies radiculopathy into L LE Standing GTB row 10 x 3" - cues for pacing and avoiding holding her breath - pt noting increased LBP Seated GTB row 10 x 3" - better tolerated Seated GTB scap retraction + shoulder extension to neutral 10 x 3" Seated bilateral GTB pallof press 10 x 3" POE 3 x 60" Prone press-ups 5 x 5", 2 sets  MANUAL THERAPY: To promote normalized muscle tension, improved flexibility, improved joint mobility, increased ROM, and reduced pain. CPAs to sacrum and lumbar vertebrae STM and manual TPR to B medial glutes  MODALITIES: IFC to B lumbar paraspinals, 80-150 Hz, intensity to patient tolerance x 15 min Moist heat pack to lumbar spine x 15 min   12/20/21 THERAPEUTIC EXERCISE: to improve flexibility, strength and mobility.  Verbal and tactile cues throughout for technique. Rec bike - L2 x 17mn Treadmill - 0.8 mph x 5 min, no incline BATCA low row 15# 2 x 10  SELF CARE: Provided education in proper posture and body mechanics for typical daily positioning and household chores to minimize strain on low back and neck.   12/17/21 Treadmill 0.9 7 min no incline Standing lat pull 15# 2x10 Leg press 15# x 10 Seated chop w/ green weight ball x 10 bil Seated chest press w/ green weight ball x 10 Seated deadlift (hip hinge) x 10 - cues to avoid lumbar hyperextension Standing lumbar flexion stretch at sink 5x5" LTR x 10 bil Supine bil piriformis figure 4 stretch pulling opp LE up 2x15"   PATIENT EDUCATION:  Education details: postural awareness and posture and body mechanics for typical daily postioning, mobility and household  tasks Person educated: Patient Education method: Explanation,  Demonstration, Verbal cues, and Handouts Education comprehension: verbalized understanding   HOME EXERCISE PROGRAM: Access Code: [email or web address] URL: [email or web address] Date: 12/23/2021 Prepared by: Annie Paras  Exercises - Seated Flexion Stretch with Swiss Ball  - 2-3 x daily - 7 x weekly - 3 reps - 30 sec hold - Seated Thoracic Flexion and Rotation with Swiss Ball  - 2-3 x daily - 7 x weekly - 3 reps - 30 sec hold - Supine Figure 4 Piriformis Stretch  - 2 x daily - 7 x weekly - 2 sets - 2 reps - 30 sec hold - Supine Double Knee to Chest Modified  - 2-3 x daily - 7 x weekly - 3 reps - 30 sec hold - Supine Lower Trunk Rotation  - 2-3 x daily - 7 x weekly - 5 reps - 10 sec hold - Supine Bridge with Mini Swiss Ball Between Knees  - 1 x daily - 7 x weekly - 2 sets - 10 reps - 5 sec hold - Seated Diagonal Chop with Medicine Ball  - 1 x daily - 3 x weekly - 2 sets - 10 reps - Seated Shoulder Row with Anchored Resistance  - 1 x daily - 3 x weekly - 2 sets - 10 reps - 3-5 hold hold - Seated Shoulder Extension and Scapular Retraction with Resistance  - 1 x daily - 3 x weekly - 2 sets - 10 reps - 3-5 sec hold - Static Prone on Elbows  - 2 x daily - 7 x weekly - 2 sets - 10 reps - 3 sec hold - Prone Press Up  - 2 x daily - 7 x weekly - 2 sets - 10 reps - 2 sec hold - Standing Lumbar Extension with Counter  - 2 x daily - 7 x weekly - 2 sets - 10 reps - 3 sec hold  Patient Education - Posture and Body Mechanics - TENS Therapy  ASSESSMENT:  CLINICAL IMPRESSION: Hannahmarie reports she recognized that she was already using some of the posture and body mechanics techniques such as lifting one leg back while leaning down into the washing machine but has not tried opening the cabinet to place her foot up/forward on lip of cabinet to help support her upper body while leaning over the sink. Attempted TB resisted rows in standing but pt reporting increased pain despite cues for core activation -  switched to seated rows/retraction with better tolerance. Introduced extension progression in prone to facilitate further centralization of her LBP with pt reporting some benefit. Session concluded with estim and moist heat to promote further muscle relaxation and pain relief.  OBJECTIVE IMPAIRMENTS: decreased activity tolerance, decreased knowledge of condition, decreased mobility, difficulty walking, decreased strength, hypomobility, increased fascial restrictions, impaired perceived functional ability, increased muscle spasms, impaired flexibility, impaired sensation, improper body mechanics, postural dysfunction, and pain.   ACTIVITY LIMITATIONS: carrying, lifting, bending, sitting, standing, squatting, stairs, and locomotion level  PARTICIPATION LIMITATIONS: meal prep, cleaning, laundry, driving, and occupation  PERSONAL FACTORS: Past/current experiences, Profession, Time since onset of injury/illness/exacerbation, and 3+ comorbidities: GERD, migraines, cervix surgery, hernia repair, orthostatic hypotension, chronic neck and back pain  are also affecting patient's functional outcome.   REHAB POTENTIAL: Good  CLINICAL DECISION MAKING: Stable/uncomplicated  EVALUATION COMPLEXITY: Low   GOALS: Goals reviewed with patient? Yes  SHORT TERM GOALS: Target date: 12/30/2021   Patient will be independent with initial HEP to improve outcomes and carryover.  Baseline:  Goal status: MET  12/20/21  2.  Patient will report centralization of radicular symptoms.  Baseline:  Goal status: MET  12/23/21 - Pain not typically extending beyond hip at this point  [place]: Target date: 01/20/2022    Patient will be independent with ongoing/advanced HEP for self-management at home.  Baseline:  Goal status: IN PROGRESS  2.  Patient will report 50-75% improvement in low back and L LE radicular pain to improve QOL.  Baseline:  Goal status: IN PROGRESS  3.  Patient to demonstrate ability to  achieve and maintain good spinal alignment/posturing and body mechanics needed for daily activities. Baseline:  Goal status: IN PROGRESS  12/20/21 - education provided  4.  Patient will demonstrate full pain free lumbar ROM to perform ADLs.   Baseline:  Goal status: IN PROGRESS  5.  Patient will demonstrate improved B LE strength to >/= 4+/5 for improved stability and ease of mobility . Baseline:  Goal status: IN PROGRESS  6.  Patient will report 50 on lumbar FOTO to demonstrate improved functional ability.  Baseline: 40 Goal status: IN PROGRESS   7.  Patient will tolerate 30+ min of walking to allow her to resume walking for exercise. Baseline:  Goal status: IN PROGRESS   PLAN: PT FREQUENCY: 2x/week  PT DURATION: 6 weeks  PLANNED INTERVENTIONS: Therapeutic exercises, Therapeutic activity, Neuromuscular re-education, Balance training, Gait training, Patient/Family education, Self Care, Joint mobilization, Aquatic Therapy, Dry Needling, Electrical stimulation, Spinal mobilization, Cryotherapy, Moist heat, Taping, Traction, Ultrasound, Manual therapy, and Re-evaluation.  PLAN FOR NEXT SESSION: try weight machines and general strengthening; progress lumbopelvic flexibility and strengthening, MT +/- DN and modalities PRN for pain and abnormal muscle tension; review posture and body mechanics training as indicated   Percival Spanish, PT 12/23/2021, 10:49 AM

## 2021-12-26 ENCOUNTER — Ambulatory Visit: Payer: Medicaid Other | Admitting: Physical Therapy

## 2021-12-26 ENCOUNTER — Encounter: Payer: Self-pay | Admitting: Physical Therapy

## 2021-12-26 DIAGNOSIS — M5459 Other low back pain: Secondary | ICD-10-CM | POA: Diagnosis not present

## 2021-12-26 DIAGNOSIS — R262 Difficulty in walking, not elsewhere classified: Secondary | ICD-10-CM

## 2021-12-26 DIAGNOSIS — M62830 Muscle spasm of back: Secondary | ICD-10-CM

## 2021-12-26 DIAGNOSIS — M6281 Muscle weakness (generalized): Secondary | ICD-10-CM

## 2021-12-26 NOTE — Therapy (Signed)
OUTPATIENT PHYSICAL THERAPY TREATMENT   Patient Name: Carla Hines MRN: [id number] DOB:09-18-1965, 56 y.o., female Today's Date: 12/26/2021   PT End of Session - 12/26/21 0934     Visit Number 6    Date for PT Re-Evaluation 01/20/22    Authorization Type [employer]    PT Start Time [phone]    PT Stop Time 1029    PT Time Calculation (min) 55 min    Activity Tolerance Patient tolerated treatment well    Behavior During Therapy [facility] for tasks assessed/performed                  Past Medical History:  Diagnosis Date   Acid reflux    Migraines    Past Surgical History:  Procedure Laterality Date   CERVIX SURGERY     HERNIA REPAIR     There are no problems to display for this patient.   PCP: Leota Jacobsen, MD  REFERRING PROVIDER: Lowella Petties, MD  REFERRING DIAG: M54.50 (ICD-10-CM) - Low back pain  RATIONALE FOR EVALUATION AND TREATMENT: Rehabilitation  THERAPY DIAG:  Other low back pain  Muscle weakness (generalized)  Muscle spasm of back  Difficulty in walking, not elsewhere classified  ONSET DATE: 01/2020 (work-related injury)  NEXT MD VISIT: unknown   SUBJECTIVE:                                                                                                                                                                                           SUBJECTIVE STATEMENT: Pt reports she tried walking for exercise yesterday but started having increased pain in her R buttocks. Stopped and rested, laying down in bed but noted increased intermittent pain in B anterior thighs when she got up to move around again later. She states she took a muscle relaxant last night and woke up w/o pain this morning.  PAIN:  Are you having pain? No  PERTINENT HISTORY: GERD, migraines, cervix surgery, hernia repair, orthostatic hypotension, chronic neck and back pain  PRECAUTIONS: None  WEIGHT BEARING RESTRICTIONS: No  FALLS:  Has patient fallen in last  6 months? No  LIVING ENVIRONMENT: Lives with: lives with their family and lives with their spouse Lives in: House/apartment Stairs: Yes: External: 1 steps; none (basement present but unusable) Has following equipment at home:  walking stick  OCCUPATION: Applying for disability  PLOF: Independent and Leisure: camping 1x/mo, church, walking qod (30-60 min)     PATIENT GOALS: "To be able to get back to walking 30-60 min and be able to get back to being able to learn how to swim."   OBJECTIVE:  DIAGNOSTIC FINDINGS:  05/02/21 - Lumbar Spine CT: IMPRESSION:  1. No acute osseous abnormality.  2. Mild degenerative changes at L4-L5    PATIENT SURVEYS:  FOTO Lumbar = 40; predicted = 50  SCREENING FOR RED FLAGS: Bowel or bladder incontinence: No Spinal tumors: No Cauda equina syndrome: No Compression fracture: No Abdominal aneurysm: No  COGNITION:  Overall cognitive status: Within functional limits for tasks assessed     SENSATION: WFL Intermittent pain, numbness and tingling down L LE  MUSCLE LENGTH: Hamstrings: mild tight B ITB: mild tight B Piriformis: mild tight B Hip flexors: mild tight B Quads: mild tight B  POSTURE:  rounded shoulders, forward head, and increased lumbar lordosis  PALPATION: Increased muscle tension and TTP in B lumbar paraspinals and L>R glutes > piriformis.   Lumbar spine hypomobile with CPAs.  LUMBAR ROM:   Active  AROM  eval  Flexion Hands to ankles - tight  Extension WFL but pain at end ROM  Right lateral flexion WFL  Left lateral flexion WFL  Right rotation WFL  Left rotation WFL   (Blank rows = not tested)  LOWER EXTREMITY ROM:    WFL  LOWER EXTREMITY MMT:    MMT Right eval Left eval  Hip flexion 4 4-  Hip extension 3+ 3+  Hip abduction 4 4-  Hip adduction 3+ 3+  Hip internal rotation 4+ 4+  Hip external rotation 4- 4-  Knee flexion 4+ 4  Knee extension 4+ 4+  Ankle dorsiflexion 4+ 4  Ankle plantarflexion 4- 7 SLS  HR 4- 8 SLS HR  Ankle inversion    Ankle eversion     (Blank rows = not tested)  LUMBAR SPECIAL TESTS:  Straight leg raise test: Positive bilaterally   TODAY'S TREATMENT:  12/26/21 THERAPEUTIC EXERCISE: to improve flexibility, strength and mobility.  Verbal and tactile cues throughout for technique. Treadmill - 1.0 mph x 6 min, no incline Hooklying KTOS piriformis/glute stretch 3 x 30" Supine figure-4 to chest piriformis stretch 3 x 30" Hooklying alt RTB bent knee fallouts 10 x 3" Bridge + RTB hip ABD isometric 10 x 5" R RTB clam in sidelying 10 x 3"  MANUAL THERAPY: To promote normalized muscle tension, improved flexibility, and reduced pain. Skilled palpation and monitoring of soft tissue during DN Trigger Point Dry-Needling  Treatment instructions: Expect mild to moderate muscle soreness. Patient verbalized understanding of these instructions and education. Patient Consent Given: Yes Education handout provided: Previously provided Muscles treated: R glute maximus/medius/minimus and R piriformis Electrical stimulation performed: No Parameters: N/A Treatment response/outcome: Twitch Response Elicited and Palpable Increase in Muscle Length STM/DTM, manual TPR and pin & stretch to muscles addressed with DN  MODALITIES: Moist heat pack to R buttocks x 15 min   12/23/21 THERAPEUTIC EXERCISE: to improve flexibility, strength and mobility.  Verbal and tactile cues throughout for technique. Treadmill - 0.8 mph x 8 min, no incline - pt noting pain into L hip afterward, but denies radiculopathy into L LE Standing GTB row 10 x 3" - cues for pacing and avoiding holding her breath - pt noting increased LBP Seated GTB row 10 x 3" - better tolerated Seated GTB scap retraction + shoulder extension to neutral 10 x 3" Seated bilateral GTB pallof press 10 x 3" POE 3 x 60" Prone press-ups 5 x 5", 2 sets  MANUAL THERAPY: To promote normalized muscle tension, improved flexibility, improved  joint mobility, increased ROM, and reduced pain. CPAs to sacrum and lumbar vertebrae STM and  manual TPR to B medial glutes  MODALITIES: IFC to B lumbar paraspinals, 80-150 Hz, intensity to patient tolerance x 15 min Moist heat pack to lumbar spine x 15 min   12/20/21 THERAPEUTIC EXERCISE: to improve flexibility, strength and mobility.  Verbal and tactile cues throughout for technique. Rec bike - L2 x 64mn Treadmill - 0.8 mph x 5 min, no incline BATCA low row 15# 2 x 10  SELF CARE: Provided education in proper posture and body mechanics for typical daily positioning and household chores to minimize strain on low back and neck.   PATIENT EDUCATION:  Education details: role of DN and DN rational, procedure, outcomes, potential side effects, and recommended post-treatment exercises/activity Person educated: Patient Education method: Explanation Education comprehension: verbalized understanding   HOME EXERCISE PROGRAM: Access Code: [email or web address]: [email or web address] Date: 12/23/2021 Prepared by: JAnnie Paras Exercises - Seated Flexion Stretch with Swiss Ball  - 2-3 x daily - 7 x weekly - 3 reps - 30 sec hold - Seated Thoracic Flexion and Rotation with Swiss Ball  - 2-3 x daily - 7 x weekly - 3 reps - 30 sec hold - Supine Figure 4 Piriformis Stretch  - 2 x daily - 7 x weekly - 2 sets - 2 reps - 30 sec hold - Supine Double Knee to Chest Modified  - 2-3 x daily - 7 x weekly - 3 reps - 30 sec hold - Supine Lower Trunk Rotation  - 2-3 x daily - 7 x weekly - 5 reps - 10 sec hold - Supine Bridge with Mini Swiss Ball Between Knees  - 1 x daily - 7 x weekly - 2 sets - 10 reps - 5 sec hold - Seated Diagonal Chop with Medicine Ball  - 1 x daily - 3 x weekly - 2 sets - 10 reps - Seated Shoulder Row with Anchored Resistance  - 1 x daily - 3 x weekly - 2 sets - 10 reps - 3-5 hold hold - Seated Shoulder Extension and Scapular Retraction with Resistance  - 1 x daily - 3 x weekly -  2 sets - 10 reps - 3-5 sec hold - Static Prone on Elbows  - 2 x daily - 7 x weekly - 2 sets - 10 reps - 3 sec hold - Prone Press Up  - 2 x daily - 7 x weekly - 2 sets - 10 reps - 2 sec hold - Standing Lumbar Extension with Counter  - 2 x daily - 7 x weekly - 2 sets - 10 reps - 3 sec hold  Patient Education - Posture and Body Mechanics - TENS Therapy  ASSESSMENT:  CLINICAL IMPRESSION: RMadonnareports increased pain in her R buttock yesterday when attempting to go for a walk to exercise. Able to get some relief from muscle relaxant last night but continued increased muscle tension and taut bands identified in R glutes and piriformis today which appeared amenable to DN. After explanation of DN rational, procedures, outcomes and potential side effects, patient verbalized consent to DN treatment in conjunction with manual STM/DTM and TPR to reduce ttp/muscle tension, followed by stretches and strengthening exercises to reinforce normal. Muscles treated as indicated above. DN produced normal response with good twitches elicited resulting in palpable reduction in pain/ttp and muscle tension. Pt educated to expect mild to moderate muscle soreness for up to 24-48 hrs and instructed to continue prescribed home exercise program and current activity level with pt verbalizing understanding of theses instructions. Session concluded  with moist heat to R buttocks to promote further muscle relaxation/relief from muscle spasm.  OBJECTIVE IMPAIRMENTS: decreased activity tolerance, decreased knowledge of condition, decreased mobility, difficulty walking, decreased strength, hypomobility, increased fascial restrictions, impaired perceived functional ability, increased muscle spasms, impaired flexibility, impaired sensation, improper body mechanics, postural dysfunction, and pain.   ACTIVITY LIMITATIONS: carrying, lifting, bending, sitting, standing, squatting, stairs, and locomotion level  PARTICIPATION LIMITATIONS: meal  prep, cleaning, laundry, driving, and occupation  PERSONAL FACTORS: Past/current experiences, Profession, Time since onset of injury/illness/exacerbation, and 3+ comorbidities: GERD, migraines, cervix surgery, hernia repair, orthostatic hypotension, chronic neck and back pain  are also affecting patient's functional outcome.   REHAB POTENTIAL: Good  CLINICAL DECISION MAKING: Stable/uncomplicated  EVALUATION COMPLEXITY: Low   GOALS: Goals reviewed with patient? Yes  SHORT TERM GOALS: Target date: 12/30/2021   Patient will be independent with initial HEP to improve outcomes and carryover.  Baseline:  Goal status: MET  12/20/21  2.  Patient will report centralization of radicular symptoms.  Baseline:  Goal status: MET  12/23/21 - Pain not typically extending beyond hip at this point  [place]: Target date: 01/20/2022    Patient will be independent with ongoing/advanced HEP for self-management at home.  Baseline:  Goal status: IN PROGRESS  2.  Patient will report 50-75% improvement in low back and L LE radicular pain to improve QOL.  Baseline:  Goal status: IN PROGRESS  3.  Patient to demonstrate ability to achieve and maintain good spinal alignment/posturing and body mechanics needed for daily activities. Baseline:  Goal status: IN PROGRESS  12/20/21 - education provided  4.  Patient will demonstrate full pain free lumbar ROM to perform ADLs.   Baseline:  Goal status: IN PROGRESS  5.  Patient will demonstrate improved B LE strength to >/= 4+/5 for improved stability and ease of mobility . Baseline:  Goal status: IN PROGRESS  6.  Patient will report 50 on lumbar FOTO to demonstrate improved functional ability.  Baseline: 40 Goal status: IN PROGRESS   7.  Patient will tolerate 30+ min of walking to allow her to resume walking for exercise. Baseline:  Goal status: IN PROGRESS   PLAN: PT FREQUENCY: 2x/week  PT DURATION: 6 weeks  PLANNED INTERVENTIONS:  Therapeutic exercises, Therapeutic activity, Neuromuscular re-education, Balance training, Gait training, Patient/Family education, Self Care, Joint mobilization, Aquatic Therapy, Dry Needling, Electrical stimulation, Spinal mobilization, Cryotherapy, Moist heat, Taping, Traction, Ultrasound, Manual therapy, and Re-evaluation.  PLAN FOR NEXT SESSION: try weight machines and general strengthening; progress lumbopelvic flexibility and strengthening, MT +/- DN and modalities PRN for pain and abnormal muscle tension; review posture and body mechanics training as indicated   Percival Spanish, PT 12/26/2021, 12:45 PM

## 2021-12-30 ENCOUNTER — Ambulatory Visit: Payer: Medicaid Other

## 2021-12-30 DIAGNOSIS — M62830 Muscle spasm of back: Secondary | ICD-10-CM

## 2021-12-30 DIAGNOSIS — M6281 Muscle weakness (generalized): Secondary | ICD-10-CM

## 2021-12-30 DIAGNOSIS — M5459 Other low back pain: Secondary | ICD-10-CM

## 2021-12-30 DIAGNOSIS — R262 Difficulty in walking, not elsewhere classified: Secondary | ICD-10-CM

## 2021-12-30 NOTE — Therapy (Signed)
OUTPATIENT PHYSICAL THERAPY TREATMENT   Patient Name: Carla Hines MRN: [id number] DOB:30-Jun-1965, 56 y.o., female Today's Date: 12/30/2021   PT End of Session - 12/30/21 0936     Visit Number 7    Date for PT Re-Evaluation 01/20/22    Authorization Type [employer]    PT Start Time 0929    PT Stop Time 1027    PT Time Calculation (min) 58 min    Activity Tolerance Patient tolerated treatment well    Behavior During Therapy [facility] for tasks assessed/performed                   Past Medical History:  Diagnosis Date   Acid reflux    Migraines    Past Surgical History:  Procedure Laterality Date   CERVIX SURGERY     HERNIA REPAIR     There are no problems to display for this patient.   PCP: Leota Jacobsen, MD  REFERRING PROVIDER: Lowella Petties, MD  REFERRING DIAG: M54.50 (ICD-10-CM) - Low back pain  RATIONALE FOR EVALUATION AND TREATMENT: Rehabilitation  THERAPY DIAG:  Other low back pain  Muscle weakness (generalized)  Muscle spasm of back  Difficulty in walking, not elsewhere classified  ONSET DATE: 01/2020 (work-related injury)  NEXT MD VISIT: unknown   SUBJECTIVE:                                                                                                                                                                                           SUBJECTIVE STATEMENT: Pt reports DN from last visit helped to reduce the hip pain now when walking she just gets lower back pain.  PAIN:  Are you having pain? Yes: NPRS scale: 2/10 Pain location: low back Pain description: ache, heaviness  PERTINENT HISTORY: GERD, migraines, cervix surgery, hernia repair, orthostatic hypotension, chronic neck and back pain  PRECAUTIONS: None  WEIGHT BEARING RESTRICTIONS: No  FALLS:  Has patient fallen in last 6 months? No  LIVING ENVIRONMENT: Lives with: lives with their family and lives with their spouse Lives in: House/apartment Stairs: Yes:  External: 1 steps; none (basement present but unusable) Has following equipment at home:  walking stick  OCCUPATION: Applying for disability  PLOF: Independent and Leisure: camping 1x/mo, church, walking qod (30-60 min)     PATIENT GOALS: "To be able to get back to walking 30-60 min and be able to get back to being able to learn how to swim."   OBJECTIVE:   DIAGNOSTIC FINDINGS:  05/02/21 - Lumbar Spine CT: IMPRESSION:  1. No acute osseous abnormality.  2. Mild degenerative changes at L4-L5  PATIENT SURVEYS:  FOTO Lumbar = 40; predicted = 50  SCREENING FOR RED FLAGS: Bowel or bladder incontinence: No Spinal tumors: No Cauda equina syndrome: No Compression fracture: No Abdominal aneurysm: No  COGNITION:  Overall cognitive status: Within functional limits for tasks assessed     SENSATION: WFL Intermittent pain, numbness and tingling down L LE  MUSCLE LENGTH: Hamstrings: mild tight B ITB: mild tight B Piriformis: mild tight B Hip flexors: mild tight B Quads: mild tight B  POSTURE:  rounded shoulders, forward head, and increased lumbar lordosis  PALPATION: Increased muscle tension and TTP in B lumbar paraspinals and L>R glutes > piriformis.   Lumbar spine hypomobile with CPAs.  LUMBAR ROM:   Active  AROM  eval 12/30/21  Flexion Hands to ankles - tight Hands to ankles - mild pain   Extension WFL but pain at end ROM [facility]- mild pain  Right lateral flexion [facility] WFL- mild pain  Left lateral flexion [facility] WFL- mild pain  Right rotation [facility] WFL  Left rotation [facility] WFL   (Blank rows = not tested)  LOWER EXTREMITY ROM:    WFL  LOWER EXTREMITY MMT:    MMT Right eval Left eval  Hip flexion 4 4-  Hip extension 3+ 3+  Hip abduction 4 4-  Hip adduction 3+ 3+  Hip internal rotation 4+ 4+  Hip external rotation 4- 4-  Knee flexion 4+ 4  Knee extension 4+ 4+  Ankle dorsiflexion 4+ 4  Ankle plantarflexion 4- 7 SLS HR 4- 8 SLS HR  Ankle inversion    Ankle eversion      (Blank rows = not tested)  LUMBAR SPECIAL TESTS:  Straight leg raise test: Positive bilaterally   TODAY'S TREATMENT: 12/30/21 Therapeutic Exercise: Treadmill - 1.1 mph x 8 min, no incline Wall angels x 10 TRX lat pullover x 10 BATCA low row 15# 3x10 Leg press 20# 2x10 Ab sets 2x10 with orange Pball Seated pelvic tilt on orange pball x 10  Manual Therapy: STM to R lumbar paraspinals, QL, glute med   Modalities: IFC estim to B lumbar paraspinals, 80-150 Hz, intensity to patient tolerance x 12 min Moist heat pack to lumbar spine x 12 min  12/26/21 THERAPEUTIC EXERCISE: to improve flexibility, strength and mobility.  Verbal and tactile cues throughout for technique. Treadmill - 1.0 mph x 6 min, no incline Hooklying KTOS piriformis/glute stretch 3 x 30" Supine figure-4 to chest piriformis stretch 3 x 30" Hooklying alt RTB bent knee fallouts 10 x 3" Bridge + RTB hip ABD isometric 10 x 5" R RTB clam in sidelying 10 x 3"  MANUAL THERAPY: To promote normalized muscle tension, improved flexibility, and reduced pain. Skilled palpation and monitoring of soft tissue during DN Trigger Point Dry-Needling  Treatment instructions: Expect mild to moderate muscle soreness. Patient verbalized understanding of these instructions and education. Patient Consent Given: Yes Education handout provided: Previously provided Muscles treated: R glute maximus/medius/minimus and R piriformis Electrical stimulation performed: No Parameters: N/A Treatment response/outcome: Twitch Response Elicited and Palpable Increase in Muscle Length STM/DTM, manual TPR and pin & stretch to muscles addressed with DN  MODALITIES: Moist heat pack to R buttocks x 15 min   12/23/21 THERAPEUTIC EXERCISE: to improve flexibility, strength and mobility.  Verbal and tactile cues throughout for technique. Treadmill - 0.8 mph x 8 min, no incline - pt noting pain into L hip afterward, but denies radiculopathy into L  LE Standing GTB row 10 x 3" - cues for pacing  and avoiding holding her breath - pt noting increased LBP Seated GTB row 10 x 3" - better tolerated Seated GTB scap retraction + shoulder extension to neutral 10 x 3" Seated bilateral GTB pallof press 10 x 3" POE 3 x 60" Prone press-ups 5 x 5", 2 sets  MANUAL THERAPY: To promote normalized muscle tension, improved flexibility, improved joint mobility, increased ROM, and reduced pain. CPAs to sacrum and lumbar vertebrae STM and manual TPR to B medial glutes  MODALITIES: IFC to B lumbar paraspinals, 80-150 Hz, intensity to patient tolerance x 15 min Moist heat pack to lumbar spine x 15 min   12/20/21 THERAPEUTIC EXERCISE: to improve flexibility, strength and mobility.  Verbal and tactile cues throughout for technique. Rec bike - L2 x 6mn Treadmill - 0.8 mph x 5 min, no incline BATCA low row 15# 2 x 10  SELF CARE: Provided education in proper posture and body mechanics for typical daily positioning and household chores to minimize strain on low back and neck.   PATIENT EDUCATION:  Education details: role of DN and DN rational, procedure, outcomes, potential side effects, and recommended post-treatment exercises/activity Person educated: Patient Education method: Explanation Education comprehension: verbalized understanding   HOME EXERCISE PROGRAM: Access Code: [email or web address]: [email or web address] Date: 12/23/2021 Prepared by: JAnnie Paras Exercises - Seated Flexion Stretch with Swiss Ball  - 2-3 x daily - 7 x weekly - 3 reps - 30 sec hold - Seated Thoracic Flexion and Rotation with Swiss Ball  - 2-3 x daily - 7 x weekly - 3 reps - 30 sec hold - Supine Figure 4 Piriformis Stretch  - 2 x daily - 7 x weekly - 2 sets - 2 reps - 30 sec hold - Supine Double Knee to Chest Modified  - 2-3 x daily - 7 x weekly - 3 reps - 30 sec hold - Supine Lower Trunk Rotation  - 2-3 x daily - 7 x weekly - 5 reps - 10 sec hold - Supine  Bridge with Mini Swiss Ball Between Knees  - 1 x daily - 7 x weekly - 2 sets - 10 reps - 5 sec hold - Seated Diagonal Chop with Medicine Ball  - 1 x daily - 3 x weekly - 2 sets - 10 reps - Seated Shoulder Row with Anchored Resistance  - 1 x daily - 3 x weekly - 2 sets - 10 reps - 3-5 hold hold - Seated Shoulder Extension and Scapular Retraction with Resistance  - 1 x daily - 3 x weekly - 2 sets - 10 reps - 3-5 sec hold - Static Prone on Elbows  - 2 x daily - 7 x weekly - 2 sets - 10 reps - 3 sec hold - Prone Press Up  - 2 x daily - 7 x weekly - 2 sets - 10 reps - 2 sec hold - Standing Lumbar Extension with Counter  - 2 x daily - 7 x weekly - 2 sets - 10 reps - 3 sec hold  Patient Education - Posture and Body Mechanics - TENS Therapy  ASSESSMENT:  CLINICAL IMPRESSION: Good response to treatment today. Continued with progression of postural exercises and core strengthening. She did report some mild pulling in neck with wall angels and toward the end of TE a slight headache coming on. Shifted focus on MT to decrease tension along musculature of the spine and pt reported resolution of headache. Pain was reported with most lumbar motions today but slight improvement in  flexion. Ended session with estim + moist heat to decrease muscle tension and pain in lower back.  OBJECTIVE IMPAIRMENTS: decreased activity tolerance, decreased knowledge of condition, decreased mobility, difficulty walking, decreased strength, hypomobility, increased fascial restrictions, impaired perceived functional ability, increased muscle spasms, impaired flexibility, impaired sensation, improper body mechanics, postural dysfunction, and pain.   ACTIVITY LIMITATIONS: carrying, lifting, bending, sitting, standing, squatting, stairs, and locomotion level  PARTICIPATION LIMITATIONS: meal prep, cleaning, laundry, driving, and occupation  PERSONAL FACTORS: Past/current experiences, Profession, Time since onset of  injury/illness/exacerbation, and 3+ comorbidities: GERD, migraines, cervix surgery, hernia repair, orthostatic hypotension, chronic neck and back pain  are also affecting patient's functional outcome.   REHAB POTENTIAL: Good  CLINICAL DECISION MAKING: Stable/uncomplicated  EVALUATION COMPLEXITY: Low   GOALS: Goals reviewed with patient? Yes  SHORT TERM GOALS: Target date: 12/30/2021   Patient will be independent with initial HEP to improve outcomes and carryover.  Baseline:  Goal status: MET  12/20/21  2.  Patient will report centralization of radicular symptoms.  Baseline:  Goal status: MET  12/23/21 - Pain not typically extending beyond hip at this point  [place]: Target date: 01/20/2022    Patient will be independent with ongoing/advanced HEP for self-management at home.  Baseline:  Goal status: IN PROGRESS  2.  Patient will report 50-75% improvement in low back and L LE radicular pain to improve QOL.  Baseline:  Goal status: IN PROGRESS  3.  Patient to demonstrate ability to achieve and maintain good spinal alignment/posturing and body mechanics needed for daily activities. Baseline:  Goal status: IN PROGRESS  12/20/21 - education provided  4.  Patient will demonstrate full pain free lumbar ROM to perform ADLs.   Baseline:  Goal status: IN PROGRESS 12/30/21 - pain with most lumbar movements  5.  Patient will demonstrate improved B LE strength to >/= 4+/5 for improved stability and ease of mobility . Baseline:  Goal status: IN PROGRESS  6.  Patient will report 50 on lumbar FOTO to demonstrate improved functional ability.  Baseline: 40 Goal status: IN PROGRESS   7.  Patient will tolerate 30+ min of walking to allow her to resume walking for exercise. Baseline:  Goal status: IN PROGRESS   PLAN: PT FREQUENCY: 2x/week  PT DURATION: 6 weeks  PLANNED INTERVENTIONS: Therapeutic exercises, Therapeutic activity, Neuromuscular re-education, Balance training, Gait  training, Patient/Family education, Self Care, Joint mobilization, Aquatic Therapy, Dry Needling, Electrical stimulation, Spinal mobilization, Cryotherapy, Moist heat, Taping, Traction, Ultrasound, Manual therapy, and Re-evaluation.  PLAN FOR NEXT SESSION: try weight machines and general strengthening; progress lumbopelvic flexibility and strengthening, MT +/- DN and modalities PRN for pain and abnormal muscle tension; review posture and body mechanics training as indicated   Navina Wohlers L Camilah Spillman, PTA 12/30/2021, 10:20 AM

## 2022-01-02 ENCOUNTER — Encounter: Payer: Medicaid Other | Admitting: Physical Therapy

## 2022-01-07 ENCOUNTER — Ambulatory Visit: Payer: Medicaid Other | Attending: Orthopedic Surgery

## 2022-01-07 DIAGNOSIS — M542 Cervicalgia: Secondary | ICD-10-CM | POA: Diagnosis present

## 2022-01-07 DIAGNOSIS — M6281 Muscle weakness (generalized): Secondary | ICD-10-CM | POA: Diagnosis present

## 2022-01-07 DIAGNOSIS — M62830 Muscle spasm of back: Secondary | ICD-10-CM | POA: Diagnosis present

## 2022-01-07 DIAGNOSIS — M62838 Other muscle spasm: Secondary | ICD-10-CM | POA: Insufficient documentation

## 2022-01-07 DIAGNOSIS — G8929 Other chronic pain: Secondary | ICD-10-CM | POA: Insufficient documentation

## 2022-01-07 DIAGNOSIS — M5459 Other low back pain: Secondary | ICD-10-CM | POA: Insufficient documentation

## 2022-01-07 DIAGNOSIS — M5441 Lumbago with sciatica, right side: Secondary | ICD-10-CM | POA: Insufficient documentation

## 2022-01-07 DIAGNOSIS — R262 Difficulty in walking, not elsewhere classified: Secondary | ICD-10-CM | POA: Insufficient documentation

## 2022-01-07 DIAGNOSIS — M5442 Lumbago with sciatica, left side: Secondary | ICD-10-CM | POA: Insufficient documentation

## 2022-01-07 NOTE — Therapy (Signed)
OUTPATIENT PHYSICAL THERAPY TREATMENT   Patient Name: Carla Hines MRN: [id number] DOB:17-Jan-1966, 56 y.o., female Today's Date: 01/07/2022   PT End of Session - 01/07/22 1335     Visit Number 8    Date for PT Re-Evaluation 01/20/22    Authorization Type [employer]    PT Start Time 1315    PT Stop Time 1413    PT Time Calculation (min) 58 min    Activity Tolerance Patient tolerated treatment well    Behavior During Therapy WFL for tasks assessed/performed                    Past Medical History:  Diagnosis Date   Acid reflux    Migraines    Past Surgical History:  Procedure Laterality Date   CERVIX SURGERY     HERNIA REPAIR     There are no problems to display for this patient.   PCP: Leota Jacobsen, MD  REFERRING PROVIDER: Lowella Petties, MD  REFERRING DIAG: M54.50 (ICD-10-CM) - Low back pain  RATIONALE FOR EVALUATION AND TREATMENT: Rehabilitation  THERAPY DIAG:  Other low back pain  Muscle weakness (generalized)  Muscle spasm of back  Difficulty in walking, not elsewhere classified  ONSET DATE: 01/2020 (work-related injury)  NEXT MD VISIT: unknown   SUBJECTIVE:                                                                                                                                                                                           SUBJECTIVE STATEMENT: Pt reports that she slipped and fell on Sunday in bathroom, hit her head, R arm and hip. Did have a mild headache and nausea that day but no more. Went to ED, all the test looked fine.  PAIN:  Are you having pain? Yes: NPRS scale: 4/10 Pain location: R hip and ankle Pain description: ache, heaviness  PERTINENT HISTORY: GERD, migraines, cervix surgery, hernia repair, orthostatic hypotension, chronic neck and back pain  PRECAUTIONS: None  WEIGHT BEARING RESTRICTIONS: No  FALLS:  Has patient fallen in last 6 months? No  LIVING ENVIRONMENT: Lives with: lives  with their family and lives with their spouse Lives in: House/apartment Stairs: Yes: External: 1 steps; none (basement present but unusable) Has following equipment at home:  walking stick  OCCUPATION: Applying for disability  PLOF: Independent and Leisure: camping 1x/mo, church, walking qod (30-60 min)     PATIENT GOALS: "To be able to get back to walking 30-60 min and be able to get back to being able to learn how to swim."   OBJECTIVE:   DIAGNOSTIC FINDINGS:  05/02/21 -  Lumbar Spine CT: IMPRESSION:  1. No acute osseous abnormality.  2. Mild degenerative changes at L4-L5    PATIENT SURVEYS:  FOTO Lumbar = 40; predicted = 50  SCREENING FOR RED FLAGS: Bowel or bladder incontinence: No Spinal tumors: No Cauda equina syndrome: No Compression fracture: No Abdominal aneurysm: No  COGNITION:  Overall cognitive status: Within functional limits for tasks assessed     SENSATION: WFL Intermittent pain, numbness and tingling down L LE  MUSCLE LENGTH: Hamstrings: mild tight B ITB: mild tight B Piriformis: mild tight B Hip flexors: mild tight B Quads: mild tight B  POSTURE:  rounded shoulders, forward head, and increased lumbar lordosis  PALPATION: Increased muscle tension and TTP in B lumbar paraspinals and L>R glutes > piriformis.   Lumbar spine hypomobile with CPAs.  LUMBAR ROM:   Active  AROM  eval 12/30/21  Flexion Hands to ankles - tight Hands to ankles - mild pain   Extension WFL but pain at end ROM [facility]- mild pain  Right lateral flexion [facility]- mild pain  Left lateral flexion [facility]- mild pain  Right rotation [facility] WFL  Left rotation [facility] WFL   (Blank rows = not tested)  LOWER EXTREMITY ROM:    [facility]  LOWER EXTREMITY MMT:    MMT Right eval Left eval Right 01/07/22 Left 01/07/22  Hip flexion 4 4- 4+ 4  Hip extension 3+ 3+    Hip abduction 4 4- 4+ 4  Hip adduction 3+ 3+ 4- 4-  Hip internal rotation 4+ 4+    Hip external rotation 4- 4- 4+ 4+  Knee flexion  4+ 4 4+ 4  Knee extension 4+ 4+    Ankle dorsiflexion 4+ 4    Ankle plantarflexion 4- 7 SLS HR 4- 8 SLS HR    Ankle inversion      Ankle eversion       (Blank rows = not tested)  LUMBAR SPECIAL TESTS:  Straight leg raise test: Positive bilaterally   TODAY'S TREATMENT: 01/07/22 Therapeutic Exercise: Recumbent Bike L2x78mn Supine SLR 2x5 bil Supine hip ADD ball squeeze 5x10" Supine isometric clam/alt bent knee fallout x 10 bil - 3 sec hold  Supine march with green TB x 10 - 3 sec hold Standing shld ext 15# x 10 Farmer carry 3x 90 ft with 6lb bil  IFC Estim x 12 min with moist heat - 80-150 Hz, intensity to tolerance 12/30/21 Therapeutic Exercise: Treadmill - 1.1 mph x 8 min, no incline Wall angels x 10 TRX lat pullover x 10 BATCA low row 15# 3x10 Leg press 20# 2x10 Ab sets 2x10 with orange Pball Seated pelvic tilt on orange pball x 10  Manual Therapy: STM to R lumbar paraspinals, QL, glute med   Modalities: IFC estim to B lumbar paraspinals, 80-150 Hz, intensity to patient tolerance x 12 min Moist heat pack to lumbar spine x 12 min  12/26/21 THERAPEUTIC EXERCISE: to improve flexibility, strength and mobility.  Verbal and tactile cues throughout for technique. Treadmill - 1.0 mph x 6 min, no incline Hooklying KTOS piriformis/glute stretch 3 x 30" Supine figure-4 to chest piriformis stretch 3 x 30" Hooklying alt RTB bent knee fallouts 10 x 3" Bridge + RTB hip ABD isometric 10 x 5" R RTB clam in sidelying 10 x 3"  MANUAL THERAPY: To promote normalized muscle tension, improved flexibility, and reduced pain. Skilled palpation and monitoring of soft tissue during DN Trigger Point Dry-Needling  Treatment instructions: Expect mild to moderate muscle soreness.  Patient verbalized understanding of these instructions and education. Patient Consent Given: Yes Education handout provided: Previously provided Muscles treated: R glute maximus/medius/minimus and R  piriformis Electrical stimulation performed: No Parameters: N/A Treatment response/outcome: Twitch Response Elicited and Palpable Increase in Muscle Length STM/DTM, manual TPR and pin & stretch to muscles addressed with DN  MODALITIES: Moist heat pack to R buttocks x 15 min    PATIENT EDUCATION:  Education details: role of DN and DN rational, procedure, outcomes, potential side effects, and recommended post-treatment exercises/activity Person educated: Patient Education method: Explanation Education comprehension: verbalized understanding   HOME EXERCISE PROGRAM: Access Code: [email or web address] URL: [email or web address] Date: 12/23/2021 Prepared by: Annie Paras  Exercises - Seated Flexion Stretch with Swiss Ball  - 2-3 x daily - 7 x weekly - 3 reps - 30 sec hold - Seated Thoracic Flexion and Rotation with Swiss Ball  - 2-3 x daily - 7 x weekly - 3 reps - 30 sec hold - Supine Figure 4 Piriformis Stretch  - 2 x daily - 7 x weekly - 2 sets - 2 reps - 30 sec hold - Supine Double Knee to Chest Modified  - 2-3 x daily - 7 x weekly - 3 reps - 30 sec hold - Supine Lower Trunk Rotation  - 2-3 x daily - 7 x weekly - 5 reps - 10 sec hold - Supine Bridge with Mini Swiss Ball Between Knees  - 1 x daily - 7 x weekly - 2 sets - 10 reps - 5 sec hold - Seated Diagonal Chop with Medicine Ball  - 1 x daily - 3 x weekly - 2 sets - 10 reps - Seated Shoulder Row with Anchored Resistance  - 1 x daily - 3 x weekly - 2 sets - 10 reps - 3-5 hold hold - Seated Shoulder Extension and Scapular Retraction with Resistance  - 1 x daily - 3 x weekly - 2 sets - 10 reps - 3-5 sec hold - Static Prone on Elbows  - 2 x daily - 7 x weekly - 2 sets - 10 reps - 3 sec hold - Prone Press Up  - 2 x daily - 7 x weekly - 2 sets - 10 reps - 2 sec hold - Standing Lumbar Extension with Counter  - 2 x daily - 7 x weekly - 2 sets - 10 reps - 3 sec hold  Patient Education - Posture and Body Mechanics - TENS  Therapy  ASSESSMENT:  CLINICAL IMPRESSION: Pt had a fall incident over the weekend where just slipped in the bathroom. She did ht her head, went to ED and all the test looked fine. No complications from fall limited today's treatment. Strength test show improvement but continued weakness more on L hip than R. Pt demonstrated a good performance of exercises with occasional cues required. Ended session with IFC estim to reduce pain.  OBJECTIVE IMPAIRMENTS: decreased activity tolerance, decreased knowledge of condition, decreased mobility, difficulty walking, decreased strength, hypomobility, increased fascial restrictions, impaired perceived functional ability, increased muscle spasms, impaired flexibility, impaired sensation, improper body mechanics, postural dysfunction, and pain.   ACTIVITY LIMITATIONS: carrying, lifting, bending, sitting, standing, squatting, stairs, and locomotion level  PARTICIPATION LIMITATIONS: meal prep, cleaning, laundry, driving, and occupation  PERSONAL FACTORS: Past/current experiences, Profession, Time since onset of injury/illness/exacerbation, and 3+ comorbidities: GERD, migraines, cervix surgery, hernia repair, orthostatic hypotension, chronic neck and back pain  are also affecting patient's functional outcome.   REHAB POTENTIAL: Good  CLINICAL DECISION MAKING: Stable/uncomplicated  EVALUATION COMPLEXITY: Low   GOALS: Goals reviewed with patient? Yes  SHORT TERM GOALS: Target date: 12/30/2021   Patient will be independent with initial HEP to improve outcomes and carryover.  Baseline:  Goal status: MET  12/20/21  2.  Patient will report centralization of radicular symptoms.  Baseline:  Goal status: MET  12/23/21 - Pain not typically extending beyond hip at this point  [place]: Target date: 01/20/2022    Patient will be independent with ongoing/advanced HEP for self-management at home.  Baseline:  Goal status: IN PROGRESS  2.  Patient will  report 50-75% improvement in low back and L LE radicular pain to improve QOL.  Baseline:  Goal status: IN PROGRESS  3.  Patient to demonstrate ability to achieve and maintain good spinal alignment/posturing and body mechanics needed for daily activities. Baseline:  Goal status: IN PROGRESS  12/20/21 - education provided  4.  Patient will demonstrate full pain free lumbar ROM to perform ADLs.   Baseline:  Goal status: IN PROGRESS 12/30/21 - pain with most lumbar movements  5.  Patient will demonstrate improved B LE strength to >/= 4+/5 for improved stability and ease of mobility . Baseline:  Goal status: IN PROGRESS  6.  Patient will report 50 on lumbar FOTO to demonstrate improved functional ability.  Baseline: 40 Goal status: IN PROGRESS   7.  Patient will tolerate 30+ min of walking to allow her to resume walking for exercise. Baseline:  Goal status: IN PROGRESS   PLAN: PT FREQUENCY: 2x/week  PT DURATION: 6 weeks  PLANNED INTERVENTIONS: Therapeutic exercises, Therapeutic activity, Neuromuscular re-education, Balance training, Gait training, Patient/Family education, Self Care, Joint mobilization, Aquatic Therapy, Dry Needling, Electrical stimulation, Spinal mobilization, Cryotherapy, Moist heat, Taping, Traction, Ultrasound, Manual therapy, and Re-evaluation.  PLAN FOR NEXT SESSION: try weight machines and hip strengthening focusing on continued weak areas; progress lumbopelvic flexibility and strengthening, MT +/- DN and modalities PRN for pain and abnormal muscle tension; review posture and body mechanics training as indicated   Artist Pais, PTA 01/07/2022, 2:07 PM

## 2022-01-09 ENCOUNTER — Ambulatory Visit: Payer: Medicaid Other

## 2022-01-09 DIAGNOSIS — M62830 Muscle spasm of back: Secondary | ICD-10-CM

## 2022-01-09 DIAGNOSIS — M5459 Other low back pain: Secondary | ICD-10-CM

## 2022-01-09 DIAGNOSIS — M6281 Muscle weakness (generalized): Secondary | ICD-10-CM

## 2022-01-09 DIAGNOSIS — R262 Difficulty in walking, not elsewhere classified: Secondary | ICD-10-CM

## 2022-01-09 NOTE — Therapy (Signed)
OUTPATIENT PHYSICAL THERAPY TREATMENT   Patient Name: Carla Hines MRN: [id number] DOB:[birth year], 56 y.o., female Today's Date: 01/09/2022   PT End of Session - 01/09/22 0935     Visit Number 9    Date for PT Re-Evaluation 01/20/22    Authorization Type [employer]    PT Start Time 0930    PT Stop Time 1025    PT Time Calculation (min) 55 min    Activity Tolerance Patient tolerated treatment well    Behavior During Therapy WFL for tasks assessed/performed                     Past Medical History:  Diagnosis Date   Acid reflux    Migraines    Past Surgical History:  Procedure Laterality Date   CERVIX SURGERY     HERNIA REPAIR     There are no problems to display for this patient.   PCP: Leota Jacobsen, MD  REFERRING PROVIDER: Lowella Petties, MD  REFERRING DIAG: M54.50 (ICD-10-CM) - Low back pain  RATIONALE FOR EVALUATION AND TREATMENT: Rehabilitation  THERAPY DIAG:  Other low back pain  Muscle weakness (generalized)  Muscle spasm of back  Difficulty in walking, not elsewhere classified  ONSET DATE: 01/2020 (work-related injury)  NEXT MD VISIT: unknown   SUBJECTIVE:                                                                                                                                                                                           SUBJECTIVE STATEMENT: Pt reports that she did not do much yesterday, pain is better today.  PAIN:  Are you having pain? Yes: NPRS scale: 2/10 Pain location: bil low back Pain description: ache, heaviness  PERTINENT HISTORY: GERD, migraines, cervix surgery, hernia repair, orthostatic hypotension, chronic neck and back pain  PRECAUTIONS: None  WEIGHT BEARING RESTRICTIONS: No  FALLS:  Has patient fallen in last 6 months? No  LIVING ENVIRONMENT: Lives with: lives with their family and lives with their spouse Lives in: House/apartment Stairs: Yes: External: 1 steps; none  (basement present but unusable) Has following equipment at home:  walking stick  OCCUPATION: Applying for disability  PLOF: Independent and Leisure: camping 1x/mo, church, walking qod (30-60 min)     PATIENT GOALS: "To be able to get back to walking 30-60 min and be able to get back to being able to learn how to swim."   OBJECTIVE:   DIAGNOSTIC FINDINGS:  05/02/21 - Lumbar Spine CT: IMPRESSION:  1. No acute osseous abnormality.  2. Mild degenerative changes at L4-L5    PATIENT SURVEYS:  FOTO  Lumbar = 40; predicted = 50  SCREENING FOR RED FLAGS: Bowel or bladder incontinence: No Spinal tumors: No Cauda equina syndrome: No Compression fracture: No Abdominal aneurysm: No  COGNITION:  Overall cognitive status: Within functional limits for tasks assessed     SENSATION: WFL Intermittent pain, numbness and tingling down L LE  MUSCLE LENGTH: Hamstrings: mild tight B ITB: mild tight B Piriformis: mild tight B Hip flexors: mild tight B Quads: mild tight B  POSTURE:  rounded shoulders, forward head, and increased lumbar lordosis  PALPATION: Increased muscle tension and TTP in B lumbar paraspinals and L>R glutes > piriformis.   Lumbar spine hypomobile with CPAs.  LUMBAR ROM:   Active  AROM  eval 12/30/21  Flexion Hands to ankles - tight Hands to ankles - mild pain   Extension WFL but pain at end ROM [facility]- mild pain  Right lateral flexion [facility]- mild pain  Left lateral flexion [facility] WFL- mild pain  Right rotation [facility] WFL  Left rotation [facility] WFL   (Blank rows = not tested)  LOWER EXTREMITY ROM:    WFL  LOWER EXTREMITY MMT:    MMT Right eval Left eval Right 01/07/22 Left 01/07/22  Hip flexion 4 4- 4+ 4  Hip extension 3+ 3+    Hip abduction 4 4- 4+ 4  Hip adduction 3+ 3+ 4- 4-  Hip internal rotation 4+ 4+    Hip external rotation 4- 4- 4+ 4+  Knee flexion 4+ 4 4+ 4  Knee extension 4+ 4+    Ankle dorsiflexion 4+ 4    Ankle plantarflexion 4- 7 SLS HR 4- 8 SLS  HR    Ankle inversion      Ankle eversion       (Blank rows = not tested)  LUMBAR SPECIAL TESTS:  Straight leg raise test: Positive bilaterally   TODAY'S TREATMENT: 01/09/22 Therapeutic Exercise: Treadmill 1.1 mph x 6 min Standing row and shld extension reviewed - provided cues on scapular retraction  Standing multifidus walkout x 10 GTB - L leg pain  Seated pallof press GTB 2x10 Seated march GTB 2 x 10 bil Standing hip abduction red TB above knee x 10 bil at counter  Manual Therapy: STM to bil quads, hip flexors, ITB  IFC estim with moist heat to L quads, 80-150 Hz, intensity to tolerance x 10 min  01/07/22 Therapeutic Exercise: Recumbent Bike L2x43mn Supine SLR 2x5 bil Supine hip ADD ball squeeze 5x10" Supine isometric clam/alt bent knee fallout x 10 bil - 3 sec hold  Supine march with green TB x 10 - 3 sec hold Standing shld ext 15# x 10 Farmer carry 3x 90 ft with 6lb bil  IFC Estim x 12 min with moist heat - 80-150 Hz, intensity to tolerance 12/30/21 Therapeutic Exercise: Treadmill - 1.1 mph x 8 min, no incline Wall angels x 10 TRX lat pullover x 10 BATCA low row 15# 3x10 Leg press 20# 2x10 Ab sets 2x10 with orange Pball Seated pelvic tilt on orange pball x 10  Manual Therapy: STM to R lumbar paraspinals, QL, glute med   Modalities: IFC estim to B lumbar paraspinals, 80-150 Hz, intensity to patient tolerance x 12 min Moist heat pack to lumbar spine x 12 min  12/26/21 THERAPEUTIC EXERCISE: to improve flexibility, strength and mobility.  Verbal and tactile cues throughout for technique. Treadmill - 1.0 mph x 6 min, no incline Hooklying KTOS piriformis/glute stretch 3 x 30" Supine figure-4 to chest piriformis stretch 3 x  30" Hooklying alt RTB bent knee fallouts 10 x 3" Bridge + RTB hip ABD isometric 10 x 5" R RTB clam in sidelying 10 x 3"  MANUAL THERAPY: To promote normalized muscle tension, improved flexibility, and reduced pain. Skilled palpation and  monitoring of soft tissue during DN Trigger Point Dry-Needling  Treatment instructions: Expect mild to moderate muscle soreness. Patient verbalized understanding of these instructions and education. Patient Consent Given: Yes Education handout provided: Previously provided Muscles treated: R glute maximus/medius/minimus and R piriformis Electrical stimulation performed: No Parameters: N/A Treatment response/outcome: Twitch Response Elicited and Palpable Increase in Muscle Length STM/DTM, manual TPR and pin & stretch to muscles addressed with DN  MODALITIES: Moist heat pack to R buttocks x 15 min    PATIENT EDUCATION:  Education details: role of DN and DN rational, procedure, outcomes, potential side effects, and recommended post-treatment exercises/activity Person educated: Patient Education method: Explanation Education comprehension: verbalized understanding   HOME EXERCISE PROGRAM: Access Code: [email or web address] URL: [email or web address] Date: 12/23/2021 Prepared by: Annie Paras  Exercises - Seated Flexion Stretch with Swiss Ball  - 2-3 x daily - 7 x weekly - 3 reps - 30 sec hold - Seated Thoracic Flexion and Rotation with Swiss Ball  - 2-3 x daily - 7 x weekly - 3 reps - 30 sec hold - Supine Figure 4 Piriformis Stretch  - 2 x daily - 7 x weekly - 2 sets - 2 reps - 30 sec hold - Supine Double Knee to Chest Modified  - 2-3 x daily - 7 x weekly - 3 reps - 30 sec hold - Supine Lower Trunk Rotation  - 2-3 x daily - 7 x weekly - 5 reps - 10 sec hold - Supine Bridge with Mini Swiss Ball Between Knees  - 1 x daily - 7 x weekly - 2 sets - 10 reps - 5 sec hold - Seated Diagonal Chop with Medicine Ball  - 1 x daily - 3 x weekly - 2 sets - 10 reps - Seated Shoulder Row with Anchored Resistance  - 1 x daily - 3 x weekly - 2 sets - 10 reps - 3-5 hold hold - Seated Shoulder Extension and Scapular Retraction with Resistance  - 1 x daily - 3 x weekly - 2 sets - 10 reps - 3-5 sec  hold - Static Prone on Elbows  - 2 x daily - 7 x weekly - 2 sets - 10 reps - 3 sec hold - Prone Press Up  - 2 x daily - 7 x weekly - 2 sets - 10 reps - 2 sec hold - Standing Lumbar Extension with Counter  - 2 x daily - 7 x weekly - 2 sets - 10 reps - 3 sec hold  Patient Education - Posture and Body Mechanics - TENS Therapy  ASSESSMENT:  CLINICAL IMPRESSION: Pt has been improving since fall on Sunday. Pain was less today coming in. We did do more walking on treadmill today and this seemed to limit her ability to do standing exercises on her L leg. Provided some instruction on rows and extensions to ensure scapular retraction. Added seated march and pallof press to HEP. MT was done to reduce soreness in both thighs, then concluded session with estim + heat to L quads. She has met LTG#2 as she reports 90% improvement in pain. She still is limited to 15 min of walking until having to rest - progressing toward LTG#7. Pt feel that she may be at a point  to where she can transition to HEP at end of POC.  OBJECTIVE IMPAIRMENTS: decreased activity tolerance, decreased knowledge of condition, decreased mobility, difficulty walking, decreased strength, hypomobility, increased fascial restrictions, impaired perceived functional ability, increased muscle spasms, impaired flexibility, impaired sensation, improper body mechanics, postural dysfunction, and pain.   ACTIVITY LIMITATIONS: carrying, lifting, bending, sitting, standing, squatting, stairs, and locomotion level  PARTICIPATION LIMITATIONS: meal prep, cleaning, laundry, driving, and occupation  PERSONAL FACTORS: Past/current experiences, Profession, Time since onset of injury/illness/exacerbation, and 3+ comorbidities: GERD, migraines, cervix surgery, hernia repair, orthostatic hypotension, chronic neck and back pain  are also affecting patient's functional outcome.   REHAB POTENTIAL: Good  CLINICAL DECISION MAKING: Stable/uncomplicated  EVALUATION  COMPLEXITY: Low   GOALS: Goals reviewed with patient? Yes  SHORT TERM GOALS: Target date: 12/30/2021   Patient will be independent with initial HEP to improve outcomes and carryover.  Baseline:  Goal status: MET  12/20/21  2.  Patient will report centralization of radicular symptoms.  Baseline:  Goal status: MET  12/23/21 - Pain not typically extending beyond hip at this point  [place]: Target date: 01/20/2022    Patient will be independent with ongoing/advanced HEP for self-management at home.  Baseline:  Goal status: IN PROGRESS  2.  Patient will report 50-75% improvement in low back and L LE radicular pain to improve QOL.  Baseline:  Goal status: MET - 01/09/22 90% improvement per patient  3.  Patient to demonstrate ability to achieve and maintain good spinal alignment/posturing and body mechanics needed for daily activities. Baseline:  Goal status: IN PROGRESS  12/20/21 - education provided  4.  Patient will demonstrate full pain free lumbar ROM to perform ADLs.   Baseline:  Goal status: IN PROGRESS 12/30/21 - pain with most lumbar movements  5.  Patient will demonstrate improved B LE strength to >/= 4+/5 for improved stability and ease of mobility . Baseline:  Goal status: IN PROGRESS  6.  Patient will report 50 on lumbar FOTO to demonstrate improved functional ability.  Baseline: 40 Goal status: IN PROGRESS   7.  Patient will tolerate 30+ min of walking to allow her to resume walking for exercise. Baseline:  Goal status: IN PROGRESS - 01/09/22 pt reports she is only able to tolerate 15 min of walking at this point   PLAN: PT FREQUENCY: 2x/week  PT DURATION: 6 weeks  PLANNED INTERVENTIONS: Therapeutic exercises, Therapeutic activity, Neuromuscular re-education, Balance training, Gait training, Patient/Family education, Self Care, Joint mobilization, Aquatic Therapy, Dry Needling, Electrical stimulation, Spinal mobilization, Cryotherapy, Moist heat, Taping,  Traction, Ultrasound, Manual therapy, and Re-evaluation.  PLAN FOR NEXT SESSION: try weight machines and hip strengthening focusing on continued weak areas; progress lumbopelvic flexibility and strengthening, MT +/- DN and modalities PRN for pain and abnormal muscle tension; review posture and body mechanics training as indicated   Artist Pais, PTA 01/09/2022, 10:17 AM

## 2022-01-12 NOTE — Therapy (Signed)
OUTPATIENT PHYSICAL THERAPY TREATMENT   Patient Name: Carla Hines MRN: [id number] DOB:[birth year], 56 y.o., female Today's Date: 01/13/2022   PT End of Session - 01/13/22 0933     Visit Number 10    Date for PT Re-Evaluation 01/20/22    Authorization Type [employer]    PT Start Time 0933    PT Stop Time 1027    PT Time Calculation (min) 54 min    Activity Tolerance Patient tolerated treatment well    Behavior During Therapy WFL for tasks assessed/performed                      Past Medical History:  Diagnosis Date   Acid reflux    Migraines    Past Surgical History:  Procedure Laterality Date   CERVIX SURGERY     HERNIA REPAIR     There are no problems to display for this patient.   PCP: Leota Jacobsen, MD  REFERRING PROVIDER: Lowella Petties, MD  REFERRING DIAG: M54.50 (ICD-10-CM) - Low back pain  RATIONALE FOR EVALUATION AND TREATMENT: Rehabilitation  THERAPY DIAG:  Other low back pain  Muscle weakness (generalized)  Difficulty in walking, not elsewhere classified  Cervicalgia  Chronic midline low back pain with bilateral sciatica  Muscle spasm of back  Other muscle spasm  ONSET DATE: 01/2020 (work-related injury)  NEXT MD VISIT: unknown   SUBJECTIVE:                                                                                                                                                                                           SUBJECTIVE STATEMENT: Really hurting yesterday about 6/10 and my neck hurt as well. I was cooking a lot yesterday. My hips feel like there are weights on them.   PAIN:  Are you having pain? Yes: NPRS scale: 3/10 Pain location: bil low back Pain description: ache, heaviness  PERTINENT HISTORY: GERD, migraines, cervix surgery, hernia repair, orthostatic hypotension, chronic neck and back pain  PRECAUTIONS: None  WEIGHT BEARING RESTRICTIONS: No  FALLS:  Has patient fallen in last 6  months? No  LIVING ENVIRONMENT: Lives with: lives with their family and lives with their spouse Lives in: House/apartment Stairs: Yes: External: 1 steps; none (basement present but unusable) Has following equipment at home:  walking stick  OCCUPATION: Applying for disability  PLOF: Independent and Leisure: camping 1x/mo, church, walking qod (30-60 min)     PATIENT GOALS: "To be able to get back to walking 30-60 min and be able to get back to being able to learn how to swim."   OBJECTIVE:  DIAGNOSTIC FINDINGS:  05/02/21 - Lumbar Spine CT: IMPRESSION:  1. No acute osseous abnormality.  2. Mild degenerative changes at L4-L5    PATIENT SURVEYS:  FOTO Lumbar = 40; predicted = 50  SCREENING FOR RED FLAGS: Bowel or bladder incontinence: No Spinal tumors: No Cauda equina syndrome: No Compression fracture: No Abdominal aneurysm: No  COGNITION:  Overall cognitive status: Within functional limits for tasks assessed     SENSATION: WFL Intermittent pain, numbness and tingling down L LE  MUSCLE LENGTH: Hamstrings: mild tight B ITB: mild tight B Piriformis: mild tight B Hip flexors: mild tight B Quads: mild tight B  POSTURE:  rounded shoulders, forward head, and increased lumbar lordosis  PALPATION: Increased muscle tension and TTP in B lumbar paraspinals and L>R glutes > piriformis.   Lumbar spine hypomobile with CPAs.  LUMBAR ROM:   Active  AROM  eval 12/30/21  Flexion Hands to ankles - tight Hands to ankles - mild pain   Extension WFL but pain at end ROM [facility]- mild pain  Right lateral flexion [facility]- mild pain  Left lateral flexion [facility] WFL- mild pain  Right rotation [facility] WFL  Left rotation [facility] WFL   (Blank rows = not tested)  LOWER EXTREMITY ROM:    WFL  LOWER EXTREMITY MMT:    MMT Right eval Left eval Right 01/07/22 Left 01/07/22  Hip flexion 4 4- 4+ 4  Hip extension 3+ 3+    Hip abduction 4 4- 4+ 4  Hip adduction 3+ 3+ 4- 4-  Hip internal rotation 4+ 4+     Hip external rotation 4- 4- 4+ 4+  Knee flexion 4+ 4 4+ 4  Knee extension 4+ 4+    Ankle dorsiflexion 4+ 4    Ankle plantarflexion 4- 7 SLS HR 4- 8 SLS HR    Ankle inversion      Ankle eversion       (Blank rows = not tested)  LUMBAR SPECIAL TESTS:  Straight leg raise test: Positive bilaterally   TODAY'S TREATMENT: 01/13/22 Therapeutic Exercise: Treadmill 1.6 mph x 3 min then 1.1 mph x 3 min Standing row and shld extension x 10 ea with cues to engage core Pallof press GTB (one band) 2 x 10 bil Standing hip abduction GTB above knee x 10 bil at counter Standing hip flexion GTB at feet 2 x 10 ea  Manual Therapy: Skilled palpation and monitoring of soft tissues during DN STM to bil gluteals, piriformis and lumbar  Trigger Point Dry-Needling  Treatment instructions: Expect mild to moderate muscle soreness. S/S of pneumothorax if dry needled over a lung field, and to seek immediate medical attention should they occur. Patient verbalized understanding of these instructions and education.  Patient Consent Given: Yes Education handout provided: Previously provided Muscles treated: bil gluteals, piriformis and lumbar multifidi L3 to L5/S1. Electrical stimulation performed: No Parameters: N/A Treatment response/outcome: Twitch Response Elicited and Palpable Increase in Muscle Length  Modalities: MH to lumbar/gluteals x 10 min   01/09/22 Therapeutic Exercise: Treadmill 1.1 mph x 6 min Standing row and shld extension reviewed - provided cues on scapular retraction  Standing multifidus walkout x 10 GTB - L leg pain  Seated pallof press GTB 2x10 Seated march GTB 2 x 10 bil Standing hip abduction red TB above knee x 10 bil at counter  Manual Therapy: STM to bil quads, hip flexors, ITB  IFC estim with moist heat to L quads, 80-150 Hz, intensity to tolerance x 10 min  01/07/22 Therapeutic Exercise: Recumbent Bike L2x68mn Supine SLR 2x5 bil Supine hip ADD ball squeeze  5x10" Supine isometric clam/alt bent knee fallout x 10 bil - 3 sec hold  Supine march with green TB x 10 - 3 sec hold Standing shld ext 15# x 10 Farmer carry 3x 90 ft with 6lb bil  IFC Estim x 12 min with moist heat - 80-150 Hz, intensity to tolerance 12/30/21 Therapeutic Exercise: Treadmill - 1.1 mph x 8 min, no incline Wall angels x 10 TRX lat pullover x 10 BATCA low row 15# 3x10 Leg press 20# 2x10 Ab sets 2x10 with orange Pball Seated pelvic tilt on orange pball x 10  Manual Therapy: STM to R lumbar paraspinals, QL, glute med   Modalities: IFC estim to B lumbar paraspinals, 80-150 Hz, intensity to patient tolerance x 12 min Moist heat pack to lumbar spine x 12 min  12/26/21 THERAPEUTIC EXERCISE: to improve flexibility, strength and mobility.  Verbal and tactile cues throughout for technique. Treadmill - 1.0 mph x 6 min, no incline Hooklying KTOS piriformis/glute stretch 3 x 30" Supine figure-4 to chest piriformis stretch 3 x 30" Hooklying alt RTB bent knee fallouts 10 x 3" Bridge + RTB hip ABD isometric 10 x 5" R RTB clam in sidelying 10 x 3"  MANUAL THERAPY: To promote normalized muscle tension, improved flexibility, and reduced pain. Skilled palpation and monitoring of soft tissue during DN Trigger Point Dry-Needling  Treatment instructions: Expect mild to moderate muscle soreness. Patient verbalized understanding of these instructions and education. Patient Consent Given: Yes Education handout provided: Previously provided Muscles treated: R glute maximus/medius/minimus and R piriformis Electrical stimulation performed: No Parameters: N/A Treatment response/outcome: Twitch Response Elicited and Palpable Increase in Muscle Length STM/DTM, manual TPR and pin & stretch to muscles addressed with DN  MODALITIES: Moist heat pack to R buttocks x 15 min    PATIENT EDUCATION:  Education details: role of DN and DN rational, procedure, outcomes, potential side effects, and  recommended post-treatment exercises/activity Person educated: Patient Education method: Explanation Education comprehension: verbalized understanding   HOME EXERCISE PROGRAM: Access Code: [email or web address]: [email or web address] Date: 12/23/2021 Prepared by: JAnnie Paras Exercises - Seated Flexion Stretch with Swiss Ball  - 2-3 x daily - 7 x weekly - 3 reps - 30 sec hold - Seated Thoracic Flexion and Rotation with Swiss Ball  - 2-3 x daily - 7 x weekly - 3 reps - 30 sec hold - Supine Figure 4 Piriformis Stretch  - 2 x daily - 7 x weekly - 2 sets - 2 reps - 30 sec hold - Supine Double Knee to Chest Modified  - 2-3 x daily - 7 x weekly - 3 reps - 30 sec hold - Supine Lower Trunk Rotation  - 2-3 x daily - 7 x weekly - 5 reps - 10 sec hold - Supine Bridge with Mini Swiss Ball Between Knees  - 1 x daily - 7 x weekly - 2 sets - 10 reps - 5 sec hold - Seated Diagonal Chop with Medicine Ball  - 1 x daily - 3 x weekly - 2 sets - 10 reps - Seated Shoulder Row with Anchored Resistance  - 1 x daily - 3 x weekly - 2 sets - 10 reps - 3-5 hold hold - Seated Shoulder Extension and Scapular Retraction with Resistance  - 1 x daily - 3 x weekly - 2 sets - 10 reps - 3-5 sec hold - Static Prone on Elbows  -  2 x daily - 7 x weekly - 2 sets - 10 reps - 3 sec hold - Prone Press Up  - 2 x daily - 7 x weekly - 2 sets - 10 reps - 2 sec hold - Standing Lumbar Extension with Counter  - 2 x daily - 7 x weekly - 2 sets - 10 reps - 3 sec hold  Patient Education - Posture and Body Mechanics - TENS Therapy  ASSESSMENT:  CLINICAL IMPRESSION: Nieshia presents today with reports of bil hip pain and ongoing left LBP which was worse yesterday when she had to stand for a prolonged period cooking. She tolerated TE without increased pain and responded very well to DN/MT to lumbar gluteals. She is still progressing toward LTGs despite set back due to her fall. She will benefit from continued skilled PT to meet  remaining goals.  OBJECTIVE IMPAIRMENTS: decreased activity tolerance, decreased knowledge of condition, decreased mobility, difficulty walking, decreased strength, hypomobility, increased fascial restrictions, impaired perceived functional ability, increased muscle spasms, impaired flexibility, impaired sensation, improper body mechanics, postural dysfunction, and pain.   ACTIVITY LIMITATIONS: carrying, lifting, bending, sitting, standing, squatting, stairs, and locomotion level  PARTICIPATION LIMITATIONS: meal prep, cleaning, laundry, driving, and occupation  PERSONAL FACTORS: Past/current experiences, Profession, Time since onset of injury/illness/exacerbation, and 3+ comorbidities: GERD, migraines, cervix surgery, hernia repair, orthostatic hypotension, chronic neck and back pain  are also affecting patient's functional outcome.   REHAB POTENTIAL: Good  CLINICAL DECISION MAKING: Stable/uncomplicated  EVALUATION COMPLEXITY: Low   GOALS: Goals reviewed with patient? Yes  SHORT TERM GOALS: Target date: 12/30/2021   Patient will be independent with initial HEP to improve outcomes and carryover.  Baseline:  Goal status: MET  12/20/21  2.  Patient will report centralization of radicular symptoms.  Baseline:  Goal status: MET  12/23/21 - Pain not typically extending beyond hip at this point  [place]: Target date: 01/20/2022    Patient will be independent with ongoing/advanced HEP for self-management at home.  Baseline:  Goal status: IN PROGRESS  2.  Patient will report 50-75% improvement in low back and L LE radicular pain to improve QOL.  Baseline:  Goal status: MET - 01/09/22 90% improvement per patient  3.  Patient to demonstrate ability to achieve and maintain good spinal alignment/posturing and body mechanics needed for daily activities. Baseline:  Goal status: IN PROGRESS  12/20/21 - education provided  4.  Patient will demonstrate full pain free lumbar ROM to perform  ADLs.   Baseline:  Goal status: IN PROGRESS 12/30/21 - pain with most lumbar movements  5.  Patient will demonstrate improved B LE strength to >/= 4+/5 for improved stability and ease of mobility . Baseline:  Goal status: IN PROGRESS  6.  Patient will report 50 on lumbar FOTO to demonstrate improved functional ability.  Baseline: 40 Goal status: IN PROGRESS   7.  Patient will tolerate 30+ min of walking to allow her to resume walking for exercise. Baseline:  Goal status: IN PROGRESS - 01/09/22 pt reports she is only able to tolerate 15 min of walking at this point   PLAN: PT FREQUENCY: 2x/week  PT DURATION: 6 weeks  PLANNED INTERVENTIONS: Therapeutic exercises, Therapeutic activity, Neuromuscular re-education, Balance training, Gait training, Patient/Family education, Self Care, Joint mobilization, Aquatic Therapy, Dry Needling, Electrical stimulation, Spinal mobilization, Cryotherapy, Moist heat, Taping, Traction, Ultrasound, Manual therapy, and Re-evaluation.  PLAN FOR NEXT SESSION: Assess DN;  try weight machines and hip strengthening focusing on continued  weak areas; progress lumbopelvic flexibility and strengthening, MT +/- DN and modalities PRN for pain and abnormal muscle tension; review posture and body mechanics training as indicated   Zamya Culhane, PT 01/13/2022, 10:43 AM

## 2022-01-13 ENCOUNTER — Ambulatory Visit: Payer: Medicaid Other | Admitting: Physical Therapy

## 2022-01-13 ENCOUNTER — Encounter: Payer: Self-pay | Admitting: Physical Therapy

## 2022-01-13 DIAGNOSIS — R262 Difficulty in walking, not elsewhere classified: Secondary | ICD-10-CM

## 2022-01-13 DIAGNOSIS — G8929 Other chronic pain: Secondary | ICD-10-CM

## 2022-01-13 DIAGNOSIS — M62838 Other muscle spasm: Secondary | ICD-10-CM

## 2022-01-13 DIAGNOSIS — M542 Cervicalgia: Secondary | ICD-10-CM

## 2022-01-13 DIAGNOSIS — M5459 Other low back pain: Secondary | ICD-10-CM | POA: Diagnosis not present

## 2022-01-13 DIAGNOSIS — M62830 Muscle spasm of back: Secondary | ICD-10-CM

## 2022-01-13 DIAGNOSIS — M6281 Muscle weakness (generalized): Secondary | ICD-10-CM

## 2022-02-06 NOTE — Therapy (Incomplete)
OUTPATIENT PHYSICAL THERAPY RE-EVALUATION   Patient Name: Carla Hines MRN: [id number] DOB:[birth year], 56 y.o., female Today's Date: 02/06/2022              Past Medical History:  Diagnosis Date   Acid reflux    Migraines    Past Surgical History:  Procedure Laterality Date   CERVIX SURGERY     HERNIA REPAIR     There are no problems to display for this patient.   PCP: Leota Jacobsen, MD  REFERRING PROVIDER: Lowella Petties, MD  REFERRING DIAG: M54.50 (ICD-10-CM) - Low back pain  RATIONALE FOR EVALUATION AND TREATMENT: Rehabilitation  THERAPY DIAG:  No diagnosis found.  ONSET DATE: 01/2020 (work-related injury)  NEXT MD VISIT: unknown ***   SUBJECTIVE:                                                                                                                                                                                           SUBJECTIVE STATEMENT: ***  Really hurting yesterday about 6/10 and my neck hurt as well. I was cooking a lot yesterday. My hips feel like there are weights on them.   PAIN:  Are you having pain? Yes: NPRS scale: 3/10 Pain location: bil low back Pain description: ache, heaviness  PERTINENT HISTORY: GERD, migraines, cervix surgery, hernia repair, orthostatic hypotension, chronic neck and back pain  PRECAUTIONS: None  WEIGHT BEARING RESTRICTIONS: No  FALLS:  Has patient fallen in last 6 months? No  LIVING ENVIRONMENT: Lives with: lives with their family and lives with their spouse Lives in: House/apartment Stairs: Yes: External: 1 steps; none (basement present but unusable) Has following equipment at home:  walking stick  OCCUPATION: Applying for disability  PLOF: Independent and Leisure: camping 1x/mo, church, walking qod (30-60 min)     PATIENT GOALS: "To be able to get back to walking 30-60 min and be able to get back to being able to learn how to swim."   OBJECTIVE:   DIAGNOSTIC FINDINGS:  05/02/21  - Lumbar Spine CT: IMPRESSION:  1. No acute osseous abnormality.  2. Mild degenerative changes at L4-L5    PATIENT SURVEYS:  FOTO Lumbar = 40; predicted = 50  SCREENING FOR RED FLAGS: Bowel or bladder incontinence: No Spinal tumors: No Cauda equina syndrome: No Compression fracture: No Abdominal aneurysm: No  COGNITION:  Overall cognitive status: Within functional limits for tasks assessed     SENSATION: WFL Intermittent pain, numbness and tingling down L LE  MUSCLE LENGTH: Hamstrings: mild tight B ITB: mild tight B Piriformis: mild tight B Hip flexors: mild tight B Quads: mild tight B  POSTURE:  rounded shoulders, forward head, and increased lumbar lordosis  PALPATION: Increased muscle tension and TTP in B lumbar paraspinals and L>R glutes > piriformis.   Lumbar spine hypomobile with CPAs.  LUMBAR ROM:   Active  AROM  eval 12/30/21 02/10/22  Flexion Hands to ankles - tight Hands to ankles - mild pain    Extension WFL but pain at end ROM [facility]- mild pain   Right lateral flexion [facility]- mild pain   Left lateral flexion [facility]- mild pain   Right rotation [facility]   Left rotation [facility] WFL    (Blank rows = not tested)  LOWER EXTREMITY ROM:    [facility]  LOWER EXTREMITY MMT:    MMT Right eval Left eval Right 01/07/22 Left 01/07/22 Right 02/10/22 Left 02/10/22  Hip flexion 4 4- 4+ 4    Hip extension 3+ 3+      Hip abduction 4 4- 4+ 4    Hip adduction 3+ 3+ 4- 4-    Hip internal rotation 4+ 4+      Hip external rotation 4- 4- 4+ 4+    Knee flexion 4+ 4 4+ 4    Knee extension 4+ 4+      Ankle dorsiflexion 4+ 4      Ankle plantarflexion 4- 7 SLS HR 4- 8 SLS HR      Ankle inversion        Ankle eversion         (Blank rows = not tested)  LUMBAR SPECIAL TESTS:  Straight leg raise test: Positive bilaterally   TODAY'S TREATMENT: 02/06/22 FOTO Re-eval ***  01/13/22 Therapeutic Exercise: Treadmill 1.6 mph x 3 min then 1.1 mph x 3 min Standing row and shld  extension x 10 ea with cues to engage core Pallof press GTB (one band) 2 x 10 bil Standing hip abduction GTB above knee x 10 bil at counter Standing hip flexion GTB at feet 2 x 10 ea  Manual Therapy: Skilled palpation and monitoring of soft tissues during DN STM to bil gluteals, piriformis and lumbar  Trigger Point Dry-Needling  Treatment instructions: Expect mild to moderate muscle soreness. S/S of pneumothorax if dry needled over a lung field, and to seek immediate medical attention should they occur. Patient verbalized understanding of these instructions and education.  Patient Consent Given: Yes Education handout provided: Previously provided Muscles treated: bil gluteals, piriformis and lumbar multifidi L3 to L5/S1. Electrical stimulation performed: No Parameters: N/A Treatment response/outcome: Twitch Response Elicited and Palpable Increase in Muscle Length  Modalities: MH to lumbar/gluteals x 10 min   01/09/22 Therapeutic Exercise: Treadmill 1.1 mph x 6 min Standing row and shld extension reviewed - provided cues on scapular retraction  Standing multifidus walkout x 10 GTB - L leg pain  Seated pallof press GTB 2x10 Seated march GTB 2 x 10 bil Standing hip abduction red TB above knee x 10 bil at counter  Manual Therapy: STM to bil quads, hip flexors, ITB  IFC estim with moist heat to L quads, 80-150 Hz, intensity to tolerance x 10 min  01/07/22 Therapeutic Exercise: Recumbent Bike L2x27mn Supine SLR 2x5 bil Supine hip ADD ball squeeze 5x10" Supine isometric clam/alt bent knee fallout x 10 bil - 3 sec hold  Supine march with green TB x 10 - 3 sec hold Standing shld ext 15# x 10 Farmer carry 3x 90 ft with 6lb bil  IFC Estim x 12 min with moist heat - 80-150 Hz, intensity to  tolerance 12/30/21 Therapeutic Exercise: Treadmill - 1.1 mph x 8 min, no incline Wall angels x 10 TRX lat pullover x 10 BATCA low row 15# 3x10 Leg press 20# 2x10 Ab sets 2x10 with orange  Pball Seated pelvic tilt on orange pball x 10  Manual Therapy: STM to R lumbar paraspinals, QL, glute med   Modalities: IFC estim to B lumbar paraspinals, 80-150 Hz, intensity to patient tolerance x 12 min Moist heat pack to lumbar spine x 12 min  12/26/21 THERAPEUTIC EXERCISE: to improve flexibility, strength and mobility.  Verbal and tactile cues throughout for technique. Treadmill - 1.0 mph x 6 min, no incline Hooklying KTOS piriformis/glute stretch 3 x 30" Supine figure-4 to chest piriformis stretch 3 x 30" Hooklying alt RTB bent knee fallouts 10 x 3" Bridge + RTB hip ABD isometric 10 x 5" R RTB clam in sidelying 10 x 3"  MANUAL THERAPY: To promote normalized muscle tension, improved flexibility, and reduced pain. Skilled palpation and monitoring of soft tissue during DN Trigger Point Dry-Needling  Treatment instructions: Expect mild to moderate muscle soreness. Patient verbalized understanding of these instructions and education. Patient Consent Given: Yes Education handout provided: Previously provided Muscles treated: R glute maximus/medius/minimus and R piriformis Electrical stimulation performed: No Parameters: N/A Treatment response/outcome: Twitch Response Elicited and Palpable Increase in Muscle Length STM/DTM, manual TPR and pin & stretch to muscles addressed with DN  MODALITIES: Moist heat pack to R buttocks x 15 min    PATIENT EDUCATION: *** Education details: anticipated POC, HEP update, and *** Person educated: Patient Education method: Explanation, Demonstration, and Handouts Education comprehension: verbalized understanding   HOME EXERCISE PROGRAM: *** Access Code: [email or web address] URL: [email or web address] Date: 12/23/2021 Prepared by: Annie Paras  Exercises - Seated Flexion Stretch with Swiss Ball  - 2-3 x daily - 7 x weekly - 3 reps - 30 sec hold - Seated Thoracic Flexion and Rotation with Swiss Ball  - 2-3 x daily - 7 x weekly - 3 reps  - 30 sec hold - Supine Figure 4 Piriformis Stretch  - 2 x daily - 7 x weekly - 2 sets - 2 reps - 30 sec hold - Supine Double Knee to Chest Modified  - 2-3 x daily - 7 x weekly - 3 reps - 30 sec hold - Supine Lower Trunk Rotation  - 2-3 x daily - 7 x weekly - 5 reps - 10 sec hold - Supine Bridge with Mini Swiss Ball Between Knees  - 1 x daily - 7 x weekly - 2 sets - 10 reps - 5 sec hold - Seated Diagonal Chop with Medicine Ball  - 1 x daily - 3 x weekly - 2 sets - 10 reps - Seated Shoulder Row with Anchored Resistance  - 1 x daily - 3 x weekly - 2 sets - 10 reps - 3-5 hold hold - Seated Shoulder Extension and Scapular Retraction with Resistance  - 1 x daily - 3 x weekly - 2 sets - 10 reps - 3-5 sec hold - Static Prone on Elbows  - 2 x daily - 7 x weekly - 2 sets - 10 reps - 3 sec hold - Prone Press Up  - 2 x daily - 7 x weekly - 2 sets - 10 reps - 2 sec hold - Standing Lumbar Extension with Counter  - 2 x daily - 7 x weekly - 2 sets - 10 reps - 3 sec hold  Patient Education - Biomedical scientist -  TENS Therapy  ASSESSMENT:  CLINICAL IMPRESSION: Jazma Pickel ***   Danajah presents today with reports of bil hip pain and ongoing left LBP which was worse yesterday when she had to stand for a prolonged period cooking. She tolerated TE without increased pain and responded very well to DN/MT to lumbar gluteals. She is still progressing toward LTGs despite set back due to her fall. She will benefit from continued skilled PT to meet remaining goals.  OBJECTIVE IMPAIRMENTS: decreased activity tolerance, decreased knowledge of condition, decreased mobility, difficulty walking, decreased strength, hypomobility, increased fascial restrictions, impaired perceived functional ability, increased muscle spasms, impaired flexibility, impaired sensation, improper body mechanics, postural dysfunction, and pain.   ACTIVITY LIMITATIONS: carrying, lifting, bending, sitting, standing, squatting, stairs, and  locomotion level  PARTICIPATION LIMITATIONS: meal prep, cleaning, laundry, driving, and occupation  PERSONAL FACTORS: Past/current experiences, Profession, Time since onset of injury/illness/exacerbation, and 3+ comorbidities: GERD, migraines, cervix surgery, hernia repair, orthostatic hypotension, chronic neck and back pain  are also affecting patient's functional outcome.   REHAB POTENTIAL: Good  CLINICAL DECISION MAKING: Stable/uncomplicated  EVALUATION COMPLEXITY: Low   GOALS: Goals reviewed with patient? Yes  SHORT TERM GOALS: Target date: 12/30/2021   Patient will be independent with initial HEP to improve outcomes and carryover.  Baseline:  Goal status: MET  12/20/21  2.  Patient will report centralization of radicular symptoms.  Baseline:  Goal status: MET  12/23/21 - Pain not typically extending beyond hip at this point  [place]: Target date: 01/20/2022    Patient will be independent with ongoing/advanced HEP for self-management at home.  Baseline:  Goal status: IN PROGRESS  2.  Patient will report 50-75% improvement in low back and L LE radicular pain to improve QOL.  Baseline:  Goal status: MET - 01/09/22 90% improvement per patient  3.  Patient to demonstrate ability to achieve and maintain good spinal alignment/posturing and body mechanics needed for daily activities. Baseline:  Goal status: IN PROGRESS  12/20/21 - education provided  4.  Patient will demonstrate full pain free lumbar ROM to perform ADLs.   Baseline:  Goal status: IN PROGRESS 12/30/21 - pain with most lumbar movements  5.  Patient will demonstrate improved B LE strength to >/= 4+/5 for improved stability and ease of mobility . Baseline:  Goal status: IN PROGRESS  6.  Patient will report 50 on lumbar FOTO to demonstrate improved functional ability.  Baseline: 40 Goal status: IN PROGRESS   7.  Patient will tolerate 30+ min of walking to allow her to resume walking for  exercise. Baseline:  Goal status: IN PROGRESS - 01/09/22 pt reports she is only able to tolerate 15 min of walking at this point   PLAN: PT FREQUENCY: 2x/week  PT DURATION: 6 weeks  PLANNED INTERVENTIONS: Therapeutic exercises, Therapeutic activity, Neuromuscular re-education, Balance training, Gait training, Patient/Family education, Self Care, Joint mobilization, Aquatic Therapy, Dry Needling, Electrical stimulation, Spinal mobilization, Cryotherapy, Moist heat, Taping, Traction, Ultrasound, Manual therapy, and Re-evaluation.  PLAN FOR NEXT SESSION: Assess DN;  try weight machines and hip strengthening focusing on continued weak areas; progress lumbopelvic flexibility and strengthening, MT +/- DN and modalities PRN for pain and abnormal muscle tension; review posture and body mechanics training as indicated   Cloteal Isaacson, PT 02/06/2022, 10:23 AM

## 2022-02-10 ENCOUNTER — Encounter: Payer: Self-pay | Admitting: Physical Therapy

## 2022-02-10 ENCOUNTER — Ambulatory Visit: Payer: Medicaid Other | Attending: Orthopedic Surgery | Admitting: Physical Therapy

## 2022-02-10 DIAGNOSIS — M542 Cervicalgia: Secondary | ICD-10-CM | POA: Diagnosis present

## 2022-02-10 DIAGNOSIS — M5459 Other low back pain: Secondary | ICD-10-CM | POA: Insufficient documentation

## 2022-02-10 DIAGNOSIS — M5442 Lumbago with sciatica, left side: Secondary | ICD-10-CM | POA: Insufficient documentation

## 2022-02-10 DIAGNOSIS — M6281 Muscle weakness (generalized): Secondary | ICD-10-CM | POA: Diagnosis present

## 2022-02-10 DIAGNOSIS — R262 Difficulty in walking, not elsewhere classified: Secondary | ICD-10-CM | POA: Insufficient documentation

## 2022-02-10 DIAGNOSIS — G8929 Other chronic pain: Secondary | ICD-10-CM | POA: Insufficient documentation

## 2022-02-10 DIAGNOSIS — M62830 Muscle spasm of back: Secondary | ICD-10-CM | POA: Insufficient documentation

## 2022-02-10 DIAGNOSIS — M62838 Other muscle spasm: Secondary | ICD-10-CM | POA: Insufficient documentation

## 2022-02-10 DIAGNOSIS — M5441 Lumbago with sciatica, right side: Secondary | ICD-10-CM | POA: Diagnosis present

## 2022-02-13 ENCOUNTER — Ambulatory Visit: Payer: Medicaid Other | Admitting: Physical Therapy

## 2022-02-13 ENCOUNTER — Encounter: Payer: Self-pay | Admitting: Physical Therapy

## 2022-02-13 DIAGNOSIS — M62838 Other muscle spasm: Secondary | ICD-10-CM

## 2022-02-13 DIAGNOSIS — M62830 Muscle spasm of back: Secondary | ICD-10-CM

## 2022-02-13 DIAGNOSIS — R262 Difficulty in walking, not elsewhere classified: Secondary | ICD-10-CM

## 2022-02-13 DIAGNOSIS — G8929 Other chronic pain: Secondary | ICD-10-CM

## 2022-02-13 DIAGNOSIS — M542 Cervicalgia: Secondary | ICD-10-CM

## 2022-02-13 DIAGNOSIS — M5459 Other low back pain: Secondary | ICD-10-CM

## 2022-02-13 DIAGNOSIS — M6281 Muscle weakness (generalized): Secondary | ICD-10-CM

## 2022-02-13 NOTE — Therapy (Signed)
OUTPATIENT PHYSICAL THERAPY TREATMENT   Patient Name: Carla Hines MRN: [id number] DOB:05/20/1965, 56 y.o., female Today's Date: 02/13/2022   PT End of Session - 02/13/22 0851     Visit Number 12    Number of Visits 23    Date for PT Re-Evaluation 03/24/22    Authorization Type [employer]    Authorization Time Period 12/23/21 - 06/21/22    Authorization - Number of Visits 16    PT Start Time 0851    PT Stop Time 0940    PT Time Calculation (min) 49 min    Activity Tolerance Patient tolerated treatment well    Behavior During Therapy [facility] for tasks assessed/performed                  Past Medical History:  Diagnosis Date   Acid reflux    Migraines    Past Surgical History:  Procedure Laterality Date   CERVIX SURGERY     HERNIA REPAIR     There are no problems to display for this patient.   PCP: Leota Jacobsen, MD  REFERRING PROVIDER: Lowella Petties, MD  REFERRING DIAG: M54.50 (ICD-10-CM) - Low back pain  RATIONALE FOR EVALUATION AND TREATMENT: Rehabilitation  THERAPY DIAG:  Other low back pain  Muscle weakness (generalized)  Difficulty in walking, not elsewhere classified  Cervicalgia  Chronic midline low back pain with bilateral sciatica  Muscle spasm of back  Other muscle spasm  ONSET DATE: 01/2020 (work-related injury)  NEXT MD VISIT: 2 months (just saw him week of 02/03/22)   SUBJECTIVE:                                                                                                                                                                                           SUBJECTIVE STATEMENT: Pt reports her legs feel like they are getting weaker.  PAIN:  Are you having pain? Yes: NPRS scale: 3/10 Pain location: B upper hips/pelvis and across low back Pain description: constant sharp  PERTINENT HISTORY: GERD, migraines, cervix surgery, hernia repair, orthostatic hypotension, chronic neck and back pain  PRECAUTIONS:  None  WEIGHT BEARING RESTRICTIONS: No  FALLS:  Has patient fallen in last 6 months? No  LIVING ENVIRONMENT: Lives with: lives with their family and lives with their spouse Lives in: House/apartment Stairs: Yes: External: 1 steps; none (basement present but unusable) Has following equipment at home:  walking stick  OCCUPATION: Applying for disability  PLOF: Independent and Leisure: camping 1x/mo, church, walking qod (30-60 min)     PATIENT GOALS: "To be able to get back to walking 30-60 min and be able  to get back to being able to learn how to swim."   OBJECTIVE:   DIAGNOSTIC FINDINGS:  05/02/21 - Lumbar Spine CT: IMPRESSION:  1. No acute osseous abnormality.  2. Mild degenerative changes at L4-L5    PATIENT SURVEYS:  FOTO Lumbar = 40; predicted = 50 FOTO lumbar = 44 02/10/22  SCREENING FOR RED FLAGS: Bowel or bladder incontinence: No Spinal tumors: No Cauda equina syndrome: No Compression fracture: No Abdominal aneurysm: No  COGNITION:  Overall cognitive status: Within functional limits for tasks assessed     SENSATION: WFL Intermittent pain, numbness and tingling down L LE  MUSCLE LENGTH: Hamstrings: mild tight B ITB: mild tight B Piriformis: mild tight B Hip flexors: mild tight B Quads: mild tight B  POSTURE:  rounded shoulders, forward head, and increased lumbar lordosis  PALPATION: Increased muscle tension and TTP in B lumbar paraspinals and L>R glutes > piriformis.   Lumbar spine hypomobile with CPAs.  LUMBAR ROM:   Active  AROM  eval 12/30/21 02/10/22  Flexion Hands to ankles - tight Hands to ankles - mild pain  WNL  Extension WFL but pain at end ROM [facility]- mild pain WNL  Right lateral flexion [facility]- mild pain WNL  Left lateral flexion [facility] WFL- mild pain WNL  Right rotation [facility] WNL  Left rotation [facility] WFL WNL   (Blank rows = not tested)  LOWER EXTREMITY ROM:    [facility]  LOWER EXTREMITY MMT:    MMT Right eval Left eval Right 01/07/22  Left 01/07/22 Right 02/10/22 Left 02/10/22  Hip flexion 4 4- 4+ 4 4+ 4+  Hip extension 3+ 3+   4+* 4+*  Hip abduction 4 4- 4+ 4 5 4+  Hip adduction 3+ 3+ 4- 4- 5 5  Hip internal rotation 4+ 4+   5 5  Hip external rotation 4- 4- 4+ 4+ 4+ 4+  Knee flexion 4+ 4 4+ 4 4+ 4  Knee extension 4+ 4+   5 5  Ankle dorsiflexion 4+ 4   5 5   Ankle plantarflexion 4- 7 SLS HR 4- 8 SLS HR      Ankle inversion        Ankle eversion         (Blank rows = not tested)  LUMBAR SPECIAL TESTS:  Straight leg raise test: Positive bilaterally   TODAY'S TREATMENT:  02/13/22 THERAPEUTIC EXERCISE: to improve flexibility, strength and mobility.  Verbal and tactile cues throughout for technique. Rec Bike - L3 x 6 min Prone on elbows x 1 min Prone over one pillow alt leg lifts with pelvic press x 5 ea - pt c/o pain at L SIJ with R LE lift Hooklying pelvic tilt 10 x 5"  Hooklying LTR 5 x 10" TrA + alt hip flexion isometric with distal LEs supported on orange x 5 - discontinued d/t increasing pain TrA + alt bent-knee fallout 10 x 3" TrA + alt B hip ABD/ADD isometric 3 x 5" - discontinued due to onset of nausea  MANUAL THERAPY: To promote normalized muscle tension, improved joint mobility, and reduced pain. Manual lumbar traction in hooklying - pt noting good relief with this  MODALITIES: Mechanical lumbar traction - neutral pull in hooklying - 60#/40# - 60 sec on/20 sec rest x 10 min (pt weight ~140#)   02/10/22 Re-eval completed Standing lumbar ext at wall 5 sec hold x 5 - no change  Prone lying x 1 min Prone on elbows x 1 min Prone over one  pillow alt leg lifts with pelvic press x 5 ea  Self Care: discussed importance of neutral spine in standing with demonstration and incorporating decompression of her spine into her daily routine.    01/13/22 Therapeutic Exercise: Treadmill 1.6 mph x 3 min then 1.1 mph x 3 min Standing row and shld extension x 10 ea with cues to engage core Pallof press GTB (one  band) 2 x 10 bil Standing hip abduction GTB above knee x 10 bil at counter Standing hip flexion GTB at feet 2 x 10 ea  Manual Therapy: Skilled palpation and monitoring of soft tissues during DN STM to bil gluteals, piriformis and lumbar  Trigger Point Dry-Needling  Treatment instructions: Expect mild to moderate muscle soreness. S/S of pneumothorax if dry needled over a lung field, and to seek immediate medical attention should they occur. Patient verbalized understanding of these instructions and education.  Patient Consent Given: Yes Education handout provided: Previously provided Muscles treated: bil gluteals, piriformis and lumbar multifidi L3 to L5/S1. Electrical stimulation performed: No Parameters: N/A Treatment response/outcome: Twitch Response Elicited and Palpable Increase in Muscle Length  Modalities: MH to lumbar/gluteals x 10 min   PATIENT EDUCATION:  Education details: anticipated POC, HEP update, and posture and body mechanics for typical daily postioning, mobility and household tasks Person educated: Patient Education method: Explanation, Demonstration, and Handouts Education comprehension: verbalized understanding and returned demonstration   HOME EXERCISE PROGRAM:  Access Code: [email or web address] URL: [email or web address] Date: 02/10/2022 Prepared by: Almyra Free  Exercises - Seated Flexion Stretch with Swiss Ball  - 2-3 x daily - 7 x weekly - 3 reps - 30 sec hold - Seated Thoracic Flexion and Rotation with Swiss Ball  - 2-3 x daily - 7 x weekly - 3 reps - 30 sec hold - Supine Figure 4 Piriformis Stretch  - 2 x daily - 7 x weekly - 2 sets - 2 reps - 30 sec hold - Supine Double Knee to Chest Modified  - 2-3 x daily - 7 x weekly - 3 reps - 30 sec hold - Supine Lower Trunk Rotation  - 2-3 x daily - 7 x weekly - 5 reps - 10 sec hold - Supine Bridge with Mini Swiss Ball Between Knees  - 1 x daily - 7 x weekly - 2 sets - 10 reps - 5 sec hold - Seated Diagonal Chop  with Medicine Ball  - 1 x daily - 3 x weekly - 2 sets - 10 reps - Seated Shoulder Row with Anchored Resistance  - 1 x daily - 3 x weekly - 2 sets - 10 reps - 3-5 hold hold - Seated Shoulder Extension and Scapular Retraction with Resistance  - 1 x daily - 3 x weekly - 2 sets - 10 reps - 3-5 sec hold - Static Prone on Elbows  - 2 x daily - 7 x weekly - 2 sets - 10 reps - 3 sec hold - Prone Press Up  - 2 x daily - 7 x weekly - 2 sets - 10 reps - 2 sec hold - Standing Lumbar Extension with Counter  - 2 x daily - 7 x weekly - 2 sets - 10 reps - 3 sec hold - Standing Anti-Rotation Press with Anchored Resistance  - 1 x daily - 7 x weekly - 3 sets - 10 reps - Seated March with Resistance  - 1 x daily - 7 x weekly - 3 sets - 10 reps - Lying Prone  - 3-4 x  daily - 7 x weekly - 1 sets - 1 reps - 5 min hold - Prone Hip Extension with Pillow Under Abdomen  - 1 x daily - 7 x weekly - 1-3 sets - 10 reps  Patient Education - Posture and Body Mechanics - TENS Therapy  ASSESSMENT:  CLINICAL IMPRESSION: Meri reports she has been able to get some relief with laying down on her side rather than sitting, but otherwise pain has been mostly constant. Reviewed exercises from last visit with pt noting some increased pain in L SIJ with prone R hip extension. SIJ assessed and alignment appears normal. Attempted to progress core/lumbopelvic strengthening with emphasis on abdominals but limited tolerance with several exercises due to increasing LBP/SIJ pain. Pt responded positively to trial of manual lumbar traction with decreased pain reported, therefore session completed with trial of mechanical lumbar traction.  OBJECTIVE IMPAIRMENTS: decreased activity tolerance, decreased knowledge of condition, decreased mobility, difficulty walking, decreased strength, hypomobility, increased fascial restrictions, impaired perceived functional ability, increased muscle spasms, impaired flexibility, impaired sensation, improper body  mechanics, postural dysfunction, and pain.   ACTIVITY LIMITATIONS: carrying, lifting, bending, sitting, standing, squatting, stairs, and locomotion level  PARTICIPATION LIMITATIONS: meal prep, cleaning, laundry, driving, and occupation  PERSONAL FACTORS: Past/current experiences, Profession, Time since onset of injury/illness/exacerbation, and 3+ comorbidities: GERD, migraines, cervix surgery, hernia repair, orthostatic hypotension, chronic neck and back pain  are also affecting patient's functional outcome.   REHAB POTENTIAL: Good  CLINICAL DECISION MAKING: Stable/uncomplicated  EVALUATION COMPLEXITY: Low   GOALS: Goals reviewed with patient? Yes  SHORT TERM GOALS: Target date: 12/30/2021   Patient will be independent with initial HEP to improve outcomes and carryover.  Baseline:  Goal status: MET  12/20/21  2.  Patient will report centralization of radicular symptoms.  Baseline:  Goal status: MET  12/23/21 - Pain not typically extending beyond hip at this point  [place]: Target date: 11/202023    Patient will be independent with ongoing/advanced HEP for self-management at home.  Baseline:  Goal status: IN PROGRESS 02/10/22  2.  Patient will report 50-75% improvement in low back and L LE radicular pain to improve QOL.  Baseline:  Goal status: REVISED  01/09/22 - 90% improvement per patient, increased pain since 02/03/22  3.  Patient to demonstrate ability to achieve and maintain good spinal alignment/posturing and body mechanics needed for daily activities. Baseline:  Goal status: IN PROGRESS  02/10/22 - feels some relief with neutral spine  4.  Patient will demonstrate full pain free lumbar ROM to perform ADLs.   Baseline:  Goal status: MET 02/10/22  5.  Patient will demonstrate improved B LE strength to >/= 4+/5 for improved stability and ease of mobility . Baseline:  Goal status: IN PROGRESS 02/10/22  6.  Patient will report 50 on lumbar FOTO to demonstrate  improved functional ability.  Baseline: 40 eval; 44 02/10/22 Goal status: IN PROGRESS   7.  Patient will tolerate 30+ min of walking to allow her to resume walking for exercise. Baseline:  Goal status: IN PROGRESS  01/09/22 - pt reports she is only able to tolerate 15 min of walking at this point; 02/10/22 - up to 15 min but last 5-10 min hurt.   PLAN: PT FREQUENCY: 2x/week  PT DURATION: 6 weeks  PLANNED INTERVENTIONS: Therapeutic exercises, Therapeutic activity, Neuromuscular re-education, Balance training, Gait training, Patient/Family education, Self Care, Joint mobilization, Aquatic Therapy, Dry Needling, Electrical stimulation, Spinal mobilization, Cryotherapy, Moist heat, Taping, Traction, Ultrasound, Manual therapy,  and Re-evaluation.  PLAN FOR NEXT SESSION: assess response to mechanical traction; focus on abdominals, lumbar strength, maintaining neutral spine with [place], PT 02/13/2022, 6:36 PM

## 2022-02-18 ENCOUNTER — Ambulatory Visit: Payer: Medicaid Other | Admitting: Physical Therapy

## 2022-02-20 ENCOUNTER — Ambulatory Visit: Payer: Medicaid Other | Admitting: Physical Therapy

## 2022-02-20 ENCOUNTER — Encounter: Payer: Self-pay | Admitting: Physical Therapy

## 2022-02-20 DIAGNOSIS — R262 Difficulty in walking, not elsewhere classified: Secondary | ICD-10-CM

## 2022-02-20 DIAGNOSIS — M6281 Muscle weakness (generalized): Secondary | ICD-10-CM

## 2022-02-20 DIAGNOSIS — M5459 Other low back pain: Secondary | ICD-10-CM

## 2022-02-20 DIAGNOSIS — G8929 Other chronic pain: Secondary | ICD-10-CM

## 2022-02-20 DIAGNOSIS — M62830 Muscle spasm of back: Secondary | ICD-10-CM

## 2022-02-20 DIAGNOSIS — M542 Cervicalgia: Secondary | ICD-10-CM

## 2022-02-20 DIAGNOSIS — M62838 Other muscle spasm: Secondary | ICD-10-CM

## 2022-02-20 NOTE — Therapy (Signed)
OUTPATIENT PHYSICAL THERAPY TREATMENT   Patient Name: Carla Hines MRN: [id number] DOB:1965-11-29, 56 y.o., female Today's Date: 02/20/2022   PT End of Session - 02/20/22 0933     Visit Number 13    Number of Visits 23    Date for PT Re-Evaluation 03/24/22    Authorization Type [employer]    Authorization Time Period 12/23/21 - 06/21/22    Authorization - Number of Visits 4    PT Start Time 0933    PT Stop Time 1029    PT Time Calculation (min) 56 min    Activity Tolerance Patient tolerated treatment well    Behavior During Therapy [facility] for tasks assessed/performed                   Past Medical History:  Diagnosis Date   Acid reflux    Migraines    Past Surgical History:  Procedure Laterality Date   CERVIX SURGERY     HERNIA REPAIR     There are no problems to display for this patient.   PCP: Leota Jacobsen, MD  REFERRING PROVIDER: Lowella Petties, MD  REFERRING DIAG: M54.50 (ICD-10-CM) - Low back pain  RATIONALE FOR EVALUATION AND TREATMENT: Rehabilitation  THERAPY DIAG:  Other low back pain  Muscle weakness (generalized)  Difficulty in walking, not elsewhere classified  Cervicalgia  Chronic midline low back pain with bilateral sciatica  Muscle spasm of back  Other muscle spasm  ONSET DATE: 01/2020 (work-related injury)  NEXT MD VISIT: 2 months (just saw him week of 02/03/22)   SUBJECTIVE:                                                                                                                                                                                           SUBJECTIVE STATEMENT: Pt reports lasting relief from the mechanical traction last visit - now more discomfort rather than pain. Still tossing and turning at night.  PAIN:  Are you having pain? Yes: NPRS scale: 2/10 Pain location: B upper hips/pelvis and across low back Pain description: discomfort  PERTINENT HISTORY: GERD, migraines, cervix surgery,  hernia repair, orthostatic hypotension, chronic neck and back pain  PRECAUTIONS: None  WEIGHT BEARING RESTRICTIONS: No  FALLS:  Has patient fallen in last 6 months? No  LIVING ENVIRONMENT: Lives with: lives with their family and lives with their spouse Lives in: House/apartment Stairs: Yes: External: 1 steps; none (basement present but unusable) Has following equipment at home:  walking stick  OCCUPATION: Applying for disability  PLOF: Independent and Leisure: camping 1x/mo, church, walking qod (30-60 min)     PATIENT GOALS: "  To be able to get back to walking 30-60 min and be able to get back to being able to learn how to swim."   OBJECTIVE:   DIAGNOSTIC FINDINGS:  05/02/21 - Lumbar Spine CT: IMPRESSION:  1. No acute osseous abnormality.  2. Mild degenerative changes at L4-L5    PATIENT SURVEYS:  FOTO Lumbar = 40; predicted = 50 FOTO lumbar = 44 02/10/22  SCREENING FOR RED FLAGS: Bowel or bladder incontinence: No Spinal tumors: No Cauda equina syndrome: No Compression fracture: No Abdominal aneurysm: No  COGNITION:  Overall cognitive status: Within functional limits for tasks assessed     SENSATION: WFL Intermittent pain, numbness and tingling down L LE  MUSCLE LENGTH: Hamstrings: mild tight B ITB: mild tight B Piriformis: mild tight B Hip flexors: mild tight B Quads: mild tight B  POSTURE:  rounded shoulders, forward head, and increased lumbar lordosis  PALPATION: Increased muscle tension and TTP in B lumbar paraspinals and L>R glutes > piriformis.   Lumbar spine hypomobile with CPAs.  LUMBAR ROM:   Active  AROM  eval 12/30/21 02/10/22  Flexion Hands to ankles - tight Hands to ankles - mild pain  WNL  Extension WFL but pain at end ROM [facility]- mild pain WNL  Right lateral flexion [facility]- mild pain WNL  Left lateral flexion [facility] WFL- mild pain WNL  Right rotation [facility] WNL  Left rotation [facility] WFL WNL   (Blank rows = not tested)  LOWER EXTREMITY ROM:     [facility]  LOWER EXTREMITY MMT:    MMT Right eval Left eval Right 01/07/22 Left 01/07/22 Right 02/10/22 Left 02/10/22  Hip flexion 4 4- 4+ 4 4+ 4+  Hip extension 3+ 3+   4+* 4+*  Hip abduction 4 4- 4+ 4 5 4+  Hip adduction 3+ 3+ 4- 4- 5 5  Hip internal rotation 4+ 4+   5 5  Hip external rotation 4- 4- 4+ 4+ 4+ 4+  Knee flexion 4+ 4 4+ 4 4+ 4  Knee extension 4+ 4+   5 5  Ankle dorsiflexion 4+ 4   5 5   Ankle plantarflexion 4- 7 SLS HR 4- 8 SLS HR      Ankle inversion        Ankle eversion         (Blank rows = not tested)  LUMBAR SPECIAL TESTS:  Straight leg raise test: Positive bilaterally   TODAY'S TREATMENT:  02/20/22 THERAPEUTIC EXERCISE: to improve flexibility, strength and mobility.  Verbal and tactile cues throughout for technique. Rec Bike - L3 x 6 min Seated 3-way lumbar flexion stretch with hands supported on green Pball 2 x 30" each position Seated R/L QL stretch 2 x 30" Bridge + GTB hip ABD isometric 5 x 5" - discontinued d/t increased pain in L upper hip/buttock TrA + GTB alt bent-knee fallout 7 x 3" - - discontinued d/t increased pain in L upper hip/buttock  MANUAL THERAPY: To promote normalized muscle tension, improved flexibility, and reduced pain. STM & manual TPR to L medial glutes  SELF CARE: Provided instruction in self-STM techniques to glutes/piriformis using tennis ball on wall.  MODALITIES: Mechanical intermittent lumbar traction - neutral pull in hooklying - 60#/40# - 60 sec on/20 sec rest x 12 min (pt weight ~140#)   02/13/22 THERAPEUTIC EXERCISE: to improve flexibility, strength and mobility.  Verbal and tactile cues throughout for technique. Rec Bike - L3 x 6 min Prone on elbows x 1 min Prone over one  pillow alt leg lifts with pelvic press x 5 ea - pt c/o pain at L SIJ with R LE lift Hooklying pelvic tilt 10 x 5"  Hooklying LTR 5 x 10" TrA + alt hip flexion isometric with distal LEs supported on orange x 5 - discontinued d/t increasing  pain TrA + alt bent-knee fallout 10 x 3" TrA + alt B hip ABD/ADD isometric 3 x 5" - discontinued due to onset of nausea  MANUAL THERAPY: To promote normalized muscle tension, improved joint mobility, and reduced pain. Manual lumbar traction in hooklying - pt noting good relief with this  MODALITIES: Mechanical lumbar traction - neutral pull in hooklying - 60#/40# - 60 sec on/20 sec rest x 10 min (pt weight ~140#)   02/10/22 Re-eval completed Standing lumbar ext at wall 5 sec hold x 5 - no change  Prone lying x 1 min Prone on elbows x 1 min Prone over one pillow alt leg lifts with pelvic press x 5 ea  Self Care: discussed importance of neutral spine in standing with demonstration and incorporating decompression of her spine into her daily routine.    PATIENT EDUCATION:  Education details: HEP update - QL stretch Person educated: Patient Education method: Explanation, Demonstration, and Handouts Education comprehension: verbalized understanding and returned demonstration   HOME EXERCISE PROGRAM:  Access Code: [email or web address] URL: [email or web address] Date: 02/20/2022 Prepared by: Annie Paras  Exercises - Seated Flexion Stretch with Swiss Ball  - 2-3 x daily - 7 x weekly - 3 reps - 30 sec hold - Seated Thoracic Flexion and Rotation with Swiss Ball  - 2-3 x daily - 7 x weekly - 3 reps - 30 sec hold - Supine Figure 4 Piriformis Stretch  - 2 x daily - 7 x weekly - 2 sets - 2 reps - 30 sec hold - Supine Double Knee to Chest Modified  - 2-3 x daily - 7 x weekly - 3 reps - 30 sec hold - Supine Lower Trunk Rotation  - 2-3 x daily - 7 x weekly - 5 reps - 10 sec hold - Supine Bridge with Mini Swiss Ball Between Knees  - 1 x daily - 7 x weekly - 2 sets - 10 reps - 5 sec hold - Seated Diagonal Chop with Medicine Ball  - 1 x daily - 3 x weekly - 2 sets - 10 reps - Seated Shoulder Row with Anchored Resistance  - 1 x daily - 3 x weekly - 2 sets - 10 reps - 3-5 hold hold - Seated  Shoulder Extension and Scapular Retraction with Resistance  - 1 x daily - 3 x weekly - 2 sets - 10 reps - 3-5 sec hold - Static Prone on Elbows  - 2 x daily - 7 x weekly - 2 sets - 10 reps - 3 sec hold - Prone Press Up  - 2 x daily - 7 x weekly - 2 sets - 10 reps - 2 sec hold - Standing Lumbar Extension with Counter  - 2 x daily - 7 x weekly - 2 sets - 10 reps - 3 sec hold - Standing Anti-Rotation Press with Anchored Resistance  - 1 x daily - 7 x weekly - 3 sets - 10 reps - Seated March with Resistance  - 1 x daily - 7 x weekly - 3 sets - 10 reps - Lying Prone  - 3-4 x daily - 7 x weekly - 1 sets - 1 reps - 5 min hold -  Prone Hip Extension with Pillow Under Abdomen  - 1 x daily - 7 x weekly - 1-3 sets - 10 reps - Seated Quadratus Lumborum Stretch in Chair  - 2-3 x daily - 7 x weekly - 3 reps - 30 sec hold  Patient Education - Posture and Body Mechanics - TENS Therapy  ASSESSMENT:  CLINICAL IMPRESSION: [place] reports good lasting relief following the traction last visit, noting more discomfort than pain at present. She notes difficultly finding a comfortable position for sleeping that works well for her neck and back, therefore provided recommendations for pillow support for neck and low back. She notes the greatest benefit from stretching but states her go-to stretch is a standing lumbar flexion reaching for the floor - suggested alternative with seated lumbar flexion with upper extremity support on Pball for better control of the stretch as well as a seated QL to better target her current pain. Attempted to work on core/proximal LE strengthening but pt limited by L buttock pain, therefore shifted focus to manual STM/TPR with good relief noted. Pt instructed in self-STM using ball on wall. Session concluded with mechanical traction as benefit noted from previous session.  OBJECTIVE IMPAIRMENTS: decreased activity tolerance, decreased knowledge of condition, decreased mobility, difficulty walking,  decreased strength, hypomobility, increased fascial restrictions, impaired perceived functional ability, increased muscle spasms, impaired flexibility, impaired sensation, improper body mechanics, postural dysfunction, and pain.   ACTIVITY LIMITATIONS: carrying, lifting, bending, sitting, standing, squatting, stairs, and locomotion level  PARTICIPATION LIMITATIONS: meal prep, cleaning, laundry, driving, and occupation  PERSONAL FACTORS: Past/current experiences, Profession, Time since onset of injury/illness/exacerbation, and 3+ comorbidities: GERD, migraines, cervix surgery, hernia repair, orthostatic hypotension, chronic neck and back pain  are also affecting patient's functional outcome.   REHAB POTENTIAL: Good  CLINICAL DECISION MAKING: Stable/uncomplicated  EVALUATION COMPLEXITY: Low   GOALS: Goals reviewed with patient? Yes  SHORT TERM GOALS: Target date: 12/30/2021   Patient will be independent with initial HEP to improve outcomes and carryover.  Baseline:  Goal status: MET  12/20/21  2.  Patient will report centralization of radicular symptoms.  Baseline:  Goal status: MET  12/23/21 - Pain not typically extending beyond hip at this point  [place]: Target date: 11/202023    Patient will be independent with ongoing/advanced HEP for self-management at home.  Baseline:  Goal status: IN PROGRESS 02/10/22  2.  Patient will report 50-75% improvement in low back and L LE radicular pain to improve QOL.  Baseline:  Goal status: REVISED  01/09/22 - 90% improvement per patient, increased pain since 02/03/22  3.  Patient to demonstrate ability to achieve and maintain good spinal alignment/posturing and body mechanics needed for daily activities. Baseline:  Goal status: IN PROGRESS  02/10/22 - feels some relief with neutral spine  4.  Patient will demonstrate full pain free lumbar ROM to perform ADLs.   Baseline:  Goal status: MET 02/10/22  5.  Patient will demonstrate improved  B LE strength to >/= 4+/5 for improved stability and ease of mobility . Baseline:  Goal status: IN PROGRESS 02/10/22  6.  Patient will report 50 on lumbar FOTO to demonstrate improved functional ability.  Baseline: 40 eval; 44 02/10/22 Goal status: IN PROGRESS   7.  Patient will tolerate 30+ min of walking to allow her to resume walking for exercise. Baseline:  Goal status: IN PROGRESS  01/09/22 - pt reports she is only able to tolerate 15 min of walking at this point; 02/10/22 - up  to 15 min but last 5-10 min hurt.   PLAN: PT FREQUENCY: 2x/week  PT DURATION: 6 weeks  PLANNED INTERVENTIONS: Therapeutic exercises, Therapeutic activity, Neuromuscular re-education, Balance training, Gait training, Patient/Family education, Self Care, Joint mobilization, Aquatic Therapy, Dry Needling, Electrical stimulation, Spinal mobilization, Cryotherapy, Moist heat, Taping, Traction, Ultrasound, Manual therapy, and Re-evaluation.  PLAN FOR NEXT SESSION:  focus on abdominals, lumbar strength, maintaining neutral spine with ADLS  Percival Spanish, PT 02/20/2022, 1:40 PM

## 2022-02-24 ENCOUNTER — Ambulatory Visit: Payer: Medicaid Other | Admitting: Physical Therapy

## 2022-02-24 ENCOUNTER — Encounter: Payer: Self-pay | Admitting: Physical Therapy

## 2022-02-24 DIAGNOSIS — M5459 Other low back pain: Secondary | ICD-10-CM | POA: Diagnosis not present

## 2022-02-24 DIAGNOSIS — M6281 Muscle weakness (generalized): Secondary | ICD-10-CM

## 2022-02-24 DIAGNOSIS — M62838 Other muscle spasm: Secondary | ICD-10-CM

## 2022-02-24 DIAGNOSIS — R262 Difficulty in walking, not elsewhere classified: Secondary | ICD-10-CM

## 2022-02-24 DIAGNOSIS — M62830 Muscle spasm of back: Secondary | ICD-10-CM

## 2022-02-24 DIAGNOSIS — G8929 Other chronic pain: Secondary | ICD-10-CM

## 2022-02-24 DIAGNOSIS — M542 Cervicalgia: Secondary | ICD-10-CM

## 2022-02-24 NOTE — Therapy (Signed)
OUTPATIENT PHYSICAL THERAPY TREATMENT   Patient Name: Carla Hines MRN: [id number] DOB:1965/05/31, 56 y.o., female Today's Date: 02/24/2022   PT End of Session - 02/24/22 0845     Visit Number 14    Number of Visits 23    Date for PT Re-Evaluation 03/24/22    Authorization Type [employer]    Authorization Time Period 12/23/21 - 06/21/22    Authorization - Number of Visits 67    PT Start Time 0845    PT Stop Time 0941    PT Time Calculation (min) 56 min    Activity Tolerance Patient tolerated treatment well    Behavior During Therapy [facility] for tasks assessed/performed                    Past Medical History:  Diagnosis Date   Acid reflux    Migraines    Past Surgical History:  Procedure Laterality Date   CERVIX SURGERY     HERNIA REPAIR     There are no problems to display for this patient.   PCP: Leota Jacobsen, MD  REFERRING PROVIDER: Lowella Petties, MD  REFERRING DIAG: M54.50 (ICD-10-CM) - Low back pain  RATIONALE FOR EVALUATION AND TREATMENT: Rehabilitation  THERAPY DIAG:  Other low back pain  Muscle weakness (generalized)  Difficulty in walking, not elsewhere classified  Cervicalgia  Chronic midline low back pain with bilateral sciatica  Muscle spasm of back  Other muscle spasm  ONSET DATE: 01/2020 (work-related injury)  NEXT MD VISIT: 2 months (just saw him week of 02/03/22)   SUBJECTIVE:                                                                                                                                                                                           SUBJECTIVE STATEMENT: Pt reports she went walking on Sat - had to walk slower than she usually does - noted less pain but feeling of definite weakness.  PAIN:  Are you having pain? Yes: NPRS scale: 2/10 Pain location: upper posterior hips/pelvis - midline to L Pain description: discomfort  PERTINENT HISTORY: GERD, migraines, cervix surgery, hernia  repair, orthostatic hypotension, chronic neck and back pain  PRECAUTIONS: None  WEIGHT BEARING RESTRICTIONS: No  FALLS:  Has patient fallen in last 6 months? No  LIVING ENVIRONMENT: Lives with: lives with their family and lives with their spouse Lives in: House/apartment Stairs: Yes: External: 1 steps; none (basement present but unusable) Has following equipment at home:  walking stick  OCCUPATION: Applying for disability  PLOF: Independent and Leisure: camping 1x/mo, church, walking qod (30-60 min)  PATIENT GOALS: "To be able to get back to walking 30-60 min and be able to get back to being able to learn how to swim."   OBJECTIVE:   DIAGNOSTIC FINDINGS:  05/02/21 - Lumbar Spine CT: IMPRESSION:  1. No acute osseous abnormality.  2. Mild degenerative changes at L4-L5    PATIENT SURVEYS:  FOTO Lumbar = 40; predicted = 50 FOTO lumbar = 44 02/10/22  SCREENING FOR RED FLAGS: Bowel or bladder incontinence: No Spinal tumors: No Cauda equina syndrome: No Compression fracture: No Abdominal aneurysm: No  COGNITION:  Overall cognitive status: Within functional limits for tasks assessed     SENSATION: WFL Intermittent pain, numbness and tingling down L LE  MUSCLE LENGTH: Hamstrings: mild tight B ITB: mild tight B Piriformis: mild tight B Hip flexors: mild tight B Quads: mild tight B  POSTURE:  rounded shoulders, forward head, and increased lumbar lordosis  PALPATION: Increased muscle tension and TTP in B lumbar paraspinals and L>R glutes > piriformis.   Lumbar spine hypomobile with CPAs.  LUMBAR ROM:   Active  AROM  eval 12/30/21 02/10/22  Flexion Hands to ankles - tight Hands to ankles - mild pain  WNL  Extension WFL but pain at end ROM [facility]- mild pain WNL  Right lateral flexion [facility]- mild pain WNL  Left lateral flexion [facility] WFL- mild pain WNL  Right rotation [facility] WNL  Left rotation [facility] WFL WNL   (Blank rows = not tested)  LOWER EXTREMITY ROM:     [facility]  LOWER EXTREMITY MMT:    MMT Right eval Left eval Right 01/07/22 Left 01/07/22 Right 02/10/22 Left 02/10/22  Hip flexion 4 4- 4+ 4 4+ 4+  Hip extension 3+ 3+   4+* 4+*  Hip abduction 4 4- 4+ 4 5 4+  Hip adduction 3+ 3+ 4- 4- 5 5  Hip internal rotation 4+ 4+   5 5  Hip external rotation 4- 4- 4+ 4+ 4+ 4+  Knee flexion 4+ 4 4+ 4 4+ 4  Knee extension 4+ 4+   5 5  Ankle dorsiflexion 4+ _0 Ankle plantarflexion 4- 7 SLS HR 4- 8 SLS HR      Ankle inversion        Ankle eversion         (Blank rows = not tested)  LUMBAR SPECIAL TESTS:  Straight leg raise test: Positive bilaterally   TODAY'S TREATMENT:  02/24/22 THERAPEUTIC EXERCISE: to improve flexibility, strength and mobility.  Verbal and tactile cues throughout for technique. Treadmill - 0.9 mph x 6 min  MANUAL THERAPY: To promote normalized muscle tension, improved flexibility, and reduced pain. Skilled palpation and monitoring of soft tissue during DN Trigger Point Dry-Needling  Treatment instructions: Expect mild to moderate muscle soreness.  Patient verbalized understanding of these instructions and education. Patient Consent Given: Yes Education handout provided: Previously provided Muscles treated: L glute medius & minimus Electrical stimulation performed: No Parameters: N/A Treatment response/outcome: Twitch Response Elicited and Palpable Increase in Muscle Length STM/DTM, manual TPR and pin & stretch to muscles addressed with DN MET to correct apparent L anterior innominate rotation of pelvis on sacrum followed by pelvic shotgun - alignment normalized after 2 rounds of MET STM/DTM and manual TPR to L hip flexors   MODALITIES: IFC to B low back/SIJ/upper buttocks, 80-150 Hz, intensity to patient tolerance x 15 min Moist heat pack to B low back/SIJ/upper buttocks x 15 min   02/20/22 THERAPEUTIC EXERCISE:  to improve flexibility, strength and mobility.  Verbal and tactile cues throughout for  technique. Rec Bike - L3 x 6 min Seated 3-way lumbar flexion stretch with hands supported on green Pball 2 x 30" each position Seated R/L QL stretch 2 x 30" Bridge + GTB hip ABD isometric 5 x 5" - discontinued d/t increased pain in L upper hip/buttock TrA + GTB alt bent-knee fallout 7 x 3" - - discontinued d/t increased pain in L upper hip/buttock  MANUAL THERAPY: To promote normalized muscle tension, improved flexibility, and reduced pain. STM & manual TPR to L medial glutes  SELF CARE: Provided instruction in self-STM techniques to glutes/piriformis using tennis ball on wall.  MODALITIES: Mechanical intermittent lumbar traction - neutral pull in hooklying - 60#/40# - 60 sec on/20 sec rest x 12 min (pt weight ~140#)   02/13/22 THERAPEUTIC EXERCISE: to improve flexibility, strength and mobility.  Verbal and tactile cues throughout for technique. Rec Bike - L3 x 6 min Prone on elbows x 1 min Prone over one pillow alt leg lifts with pelvic press x 5 ea - pt c/o pain at L SIJ with R LE lift Hooklying pelvic tilt 10 x 5"  Hooklying LTR 5 x 10" TrA + alt hip flexion isometric with distal LEs supported on orange x 5 - discontinued d/t increasing pain TrA + alt bent-knee fallout 10 x 3" TrA + alt B hip ABD/ADD isometric 3 x 5" - discontinued due to onset of nausea  MANUAL THERAPY: To promote normalized muscle tension, improved joint mobility, and reduced pain. Manual lumbar traction in hooklying - pt noting good relief with this  MODALITIES: Mechanical lumbar traction - neutral pull in hooklying - 60#/40# - 60 sec on/20 sec rest x 10 min (pt weight ~140#)   PATIENT EDUCATION:  Education details: role of DN and DN rational, procedure, outcomes, potential side effects, and recommended post-treatment exercises/activity Person educated: Patient Education method: Explanation and Handouts Education comprehension: verbalized understanding   HOME EXERCISE PROGRAM:  Access Code:  [email or web address] URL: [email or web address] Date: 02/20/2022 Prepared by: Annie Paras  Exercises - Seated Flexion Stretch with Swiss Ball  - 2-3 x daily - 7 x weekly - 3 reps - 30 sec hold - Seated Thoracic Flexion and Rotation with Swiss Ball  - 2-3 x daily - 7 x weekly - 3 reps - 30 sec hold - Supine Figure 4 Piriformis Stretch  - 2 x daily - 7 x weekly - 2 sets - 2 reps - 30 sec hold - Supine Double Knee to Chest Modified  - 2-3 x daily - 7 x weekly - 3 reps - 30 sec hold - Supine Lower Trunk Rotation  - 2-3 x daily - 7 x weekly - 5 reps - 10 sec hold - Supine Bridge with Mini Swiss Ball Between Knees  - 1 x daily - 7 x weekly - 2 sets - 10 reps - 5 sec hold - Seated Diagonal Chop with Medicine Ball  - 1 x daily - 3 x weekly - 2 sets - 10 reps - Seated Shoulder Row with Anchored Resistance  - 1 x daily - 3 x weekly - 2 sets - 10 reps - 3-5 hold hold - Seated Shoulder Extension and Scapular Retraction with Resistance  - 1 x daily - 3 x weekly - 2 sets - 10 reps - 3-5 sec hold - Static Prone on Elbows  - 2 x daily - 7 x weekly - 2 sets - 10 reps -  3 sec hold - Prone Press Up  - 2 x daily - 7 x weekly - 2 sets - 10 reps - 2 sec hold - Standing Lumbar Extension with Counter  - 2 x daily - 7 x weekly - 2 sets - 10 reps - 3 sec hold - Standing Anti-Rotation Press with Anchored Resistance  - 1 x daily - 7 x weekly - 3 sets - 10 reps - Seated March with Resistance  - 1 x daily - 7 x weekly - 3 sets - 10 reps - Lying Prone  - 3-4 x daily - 7 x weekly - 1 sets - 1 reps - 5 min hold - Prone Hip Extension with Pillow Under Abdomen  - 1 x daily - 7 x weekly - 1-3 sets - 10 reps - Seated Quadratus Lumborum Stretch in Chair  - 2-3 x daily - 7 x weekly - 3 reps - 30 sec hold  Patient Education - Posture and Body Mechanics - TENS Therapy  ASSESSMENT:  CLINICAL IMPRESSION: Ceniya reports she has been having more difficulty getting her muscles to relax with the self-STM. Palpation revealing  increased muscle tension and TTP in [place] glutes and to a lesser degree in L hip flexors. After explanation of DN rational, procedures, outcomes and potential side effects, patient verbalized consent to DN treatment in conjunction with manual STM/DTM and TPR to reduce ttp/muscle tension. Muscles treated as indicated above. DN produced normal response with good twitches elicited resulting in palpable reduction in pain/ttp and muscle tension. Pt educated to expect mild to moderate muscle soreness for up to 24-48 hrs and instructed to continue prescribed home exercise program and current activity level with pt verbalizing understanding of theses instructions. Following DN, pt still localizing increased pain to L SIJ with SIJ assessment revealing slight apparent anterior rotation of pelvis on sacrum - correctable witt 2 rounds of MET followed by pelvic shotgun. Session concluded with estim and moist heat to B low back, SIJ and upper glutes to promote further muscle relaxation/relief from muscle spasm.  OBJECTIVE IMPAIRMENTS: decreased activity tolerance, decreased knowledge of condition, decreased mobility, difficulty walking, decreased strength, hypomobility, increased fascial restrictions, impaired perceived functional ability, increased muscle spasms, impaired flexibility, impaired sensation, improper body mechanics, postural dysfunction, and pain.   ACTIVITY LIMITATIONS: carrying, lifting, bending, sitting, standing, squatting, stairs, and locomotion level  PARTICIPATION LIMITATIONS: meal prep, cleaning, laundry, driving, and occupation  PERSONAL FACTORS: Past/current experiences, Profession, Time since onset of injury/illness/exacerbation, and 3+ comorbidities: GERD, migraines, cervix surgery, hernia repair, orthostatic hypotension, chronic neck and back pain  are also affecting patient's functional outcome.   REHAB POTENTIAL: Good  CLINICAL DECISION MAKING: Stable/uncomplicated  EVALUATION  COMPLEXITY: Low   GOALS: Goals reviewed with patient? Yes  SHORT TERM GOALS: Target date: 12/30/2021   Patient will be independent with initial HEP to improve outcomes and carryover.  Baseline:  Goal status: MET  12/20/21  2.  Patient will report centralization of radicular symptoms.  Baseline:  Goal status: MET  12/23/21 - Pain not typically extending beyond hip at this point  [place]: Target date: 11/202023    Patient will be independent with ongoing/advanced HEP for self-management at home.  Baseline:  Goal status: IN PROGRESS 02/10/22  2.  Patient will report 50-75% improvement in low back and L LE radicular pain to improve QOL.  Baseline:  Goal status: REVISED  01/09/22 - 90% improvement per patient, increased pain since 02/03/22  3.  Patient to demonstrate ability  to achieve and maintain good spinal alignment/posturing and body mechanics needed for daily activities. Baseline:  Goal status: IN PROGRESS  02/10/22 - feels some relief with neutral spine  4.  Patient will demonstrate full pain free lumbar ROM to perform ADLs.   Baseline:  Goal status: MET 02/10/22  5.  Patient will demonstrate improved B LE strength to >/= 4+/5 for improved stability and ease of mobility . Baseline:  Goal status: IN PROGRESS 02/10/22  6.  Patient will report 50 on lumbar FOTO to demonstrate improved functional ability.  Baseline: 40 eval; 44 02/10/22 Goal status: IN PROGRESS   7.  Patient will tolerate 30+ min of walking to allow her to resume walking for exercise. Baseline:  Goal status: IN PROGRESS  01/09/22 - pt reports she is only able to tolerate 15 min of walking at this point; 02/10/22 - up to 15 min but last 5-10 min hurt.   PLAN: PT FREQUENCY: 2x/week  PT DURATION: 6 weeks  PLANNED INTERVENTIONS: Therapeutic exercises, Therapeutic activity, Neuromuscular re-education, Balance training, Gait training, Patient/Family education, Self Care, Joint mobilization, Aquatic Therapy, Dry  Needling, Electrical stimulation, Spinal mobilization, Cryotherapy, Moist heat, Taping, Traction, Ultrasound, Manual therapy, and Re-evaluation.  PLAN FOR NEXT SESSION:  assess response to DN; reassess SIJ alignment; focus on abdominals, lumbar strength, maintaining neutral spine with [place], PT 02/24/2022, 2:06 PM

## 2022-02-27 ENCOUNTER — Encounter: Payer: Self-pay | Admitting: Physical Therapy

## 2022-02-27 ENCOUNTER — Ambulatory Visit: Payer: Medicaid Other | Admitting: Physical Therapy

## 2022-02-27 DIAGNOSIS — M5459 Other low back pain: Secondary | ICD-10-CM

## 2022-02-27 DIAGNOSIS — M542 Cervicalgia: Secondary | ICD-10-CM

## 2022-02-27 DIAGNOSIS — M62830 Muscle spasm of back: Secondary | ICD-10-CM

## 2022-02-27 DIAGNOSIS — M6281 Muscle weakness (generalized): Secondary | ICD-10-CM

## 2022-02-27 DIAGNOSIS — M62838 Other muscle spasm: Secondary | ICD-10-CM

## 2022-02-27 DIAGNOSIS — R262 Difficulty in walking, not elsewhere classified: Secondary | ICD-10-CM

## 2022-02-27 DIAGNOSIS — G8929 Other chronic pain: Secondary | ICD-10-CM

## 2022-02-27 NOTE — Therapy (Signed)
OUTPATIENT PHYSICAL THERAPY TREATMENT   Patient Name: Carla Hines MRN: [id number] DOB:Mar 08, 1966, 56 y.o., female Today's Date: 02/27/2022   PT End of Session - 02/27/22 0853     Visit Number 15    Number of Visits 23    Date for PT Re-Evaluation 03/24/22    Authorization Type [employer]    Authorization Time Period 12/23/21 - 06/21/22    Authorization - Number of Visits 90    PT Start Time 0853   Pt arrived late   PT Stop Time 0933    PT Time Calculation (min) 40 min    Activity Tolerance Patient tolerated treatment well    Behavior During Therapy [facility] for tasks assessed/performed                    Past Medical History:  Diagnosis Date   Acid reflux    Migraines    Past Surgical History:  Procedure Laterality Date   CERVIX SURGERY     HERNIA REPAIR     There are no problems to display for this patient.   PCP: Leota Jacobsen, MD  REFERRING PROVIDER: Lowella Petties, MD  REFERRING DIAG: M54.50 (ICD-10-CM) - Low back pain  RATIONALE FOR EVALUATION AND TREATMENT: Rehabilitation  THERAPY DIAG:  Other low back pain  Muscle weakness (generalized)  Difficulty in walking, not elsewhere classified  Cervicalgia  Chronic midline low back pain with bilateral sciatica  Muscle spasm of back  Other muscle spasm  ONSET DATE: 01/2020 (work-related injury)  NEXT MD VISIT: 2 months (just saw him week of 02/03/22)   SUBJECTIVE:                                                                                                                                                                                           SUBJECTIVE STATEMENT: Pt reports she was able to walk a longer distance before the onset of pain following the DN last session and no longer felt the heaviness in her leg. When the pain did start she reports it was more R-sided in the buttock and groin.  PAIN:  Are you having pain? Yes: NPRS scale: 2/10 Pain location: midline low  back Pain description: sharp  PERTINENT HISTORY: GERD, migraines, cervix surgery, hernia repair, orthostatic hypotension, chronic neck and back pain  PRECAUTIONS: None  WEIGHT BEARING RESTRICTIONS: No  FALLS:  Has patient fallen in last 6 months? No  LIVING ENVIRONMENT: Lives with: lives with their family and lives with their spouse Lives in: House/apartment Stairs: Yes: External: 1 steps; none (basement present but unusable) Has following equipment at home:  walking stick  OCCUPATION: Applying for disability  PLOF: Independent and Leisure: camping 1x/mo, church, walking qod (30-60 min)     PATIENT GOALS: "To be able to get back to walking 30-60 min and be able to get back to being able to learn how to swim."   OBJECTIVE:   DIAGNOSTIC FINDINGS:  05/02/21 - Lumbar Spine CT: IMPRESSION:  1. No acute osseous abnormality.  2. Mild degenerative changes at L4-L5    PATIENT SURVEYS:  FOTO Lumbar = 40; predicted = 50 FOTO lumbar = 44 02/10/22  SCREENING FOR RED FLAGS: Bowel or bladder incontinence: No Spinal tumors: No Cauda equina syndrome: No Compression fracture: No Abdominal aneurysm: No  COGNITION:  Overall cognitive status: Within functional limits for tasks assessed     SENSATION: WFL Intermittent pain, numbness and tingling down L LE  MUSCLE LENGTH: Hamstrings: mild tight B ITB: mild tight B Piriformis: mild tight B Hip flexors: mild tight B Quads: mild tight B  POSTURE:  rounded shoulders, forward head, and increased lumbar lordosis  PALPATION: Increased muscle tension and TTP in B lumbar paraspinals and L>R glutes > piriformis.   Lumbar spine hypomobile with CPAs.  LUMBAR ROM:   Active  AROM  eval 12/30/21 02/10/22  Flexion Hands to ankles - tight Hands to ankles - mild pain  WNL  Extension WFL but pain at end ROM [facility]- mild pain WNL  Right lateral flexion [facility]- mild pain WNL  Left lateral flexion [facility] WFL- mild pain WNL  Right rotation [facility]  WNL  Left rotation [facility] WFL WNL   (Blank rows = not tested)  LOWER EXTREMITY ROM:    [facility]  LOWER EXTREMITY MMT:    MMT Right eval Left eval Right 01/07/22 Left 01/07/22 Right 02/10/22 Left 02/10/22  Hip flexion 4 4- 4+ 4 4+ 4+  Hip extension 3+ 3+   4+* 4+*  Hip abduction 4 4- 4+ 4 5 4+  Hip adduction 3+ 3+ 4- 4- 5 5  Hip internal rotation 4+ 4+   5 5  Hip external rotation 4- 4- 4+ 4+ 4+ 4+  Knee flexion 4+ 4 4+ 4 4+ 4  Knee extension 4+ 4+   5 5  Ankle dorsiflexion 4+ 4   5 5   Ankle plantarflexion 4- 7 SLS HR 4- 8 SLS HR      Ankle inversion        Ankle eversion         (Blank rows = not tested)  LUMBAR SPECIAL TESTS:  Straight leg raise test: Positive bilaterally   TODAY'S TREATMENT:  02/27/22 THERAPEUTIC EXERCISE: to improve flexibility, strength and mobility.  Verbal and tactile cues throughout for technique. Treadmill - 0.9 mph x 6 min Seated 3-way lumbar flexion stretch with hands supported on green Pball 2 x 30" each position - pt noting L sciatic-type pain triggered with R lateral stretch Foam rolling to B glutes and piriformis Bridge + GTB hip ABD isometric 5 x 3", 2 sets - limited lift on 1st set, full lift on 2nd set - no increased pain when reps limited to sets of 5 TrA+ hooklying GTB clam 5 x 3" TrA + hooklying GTB alt bent-knee fallout 5 x 3", 2 sets R side-lying L GTB clam 5 x 3", 2 sets TrA + hooklying GTB alt bent-knee march 5 x 3", 2 sets  SELF CARE: Provided instruction in self-STM techniques to L glutes and piriformis using foam roller.   02/24/22 THERAPEUTIC EXERCISE: to improve flexibility, strength and  mobility.  Verbal and tactile cues throughout for technique. Treadmill - 0.9 mph x 6 min  MANUAL THERAPY: To promote normalized muscle tension, improved flexibility, and reduced pain. Skilled palpation and monitoring of soft tissue during DN Trigger Point Dry-Needling  Treatment instructions: Expect mild to moderate muscle soreness.  Patient  verbalized understanding of these instructions and education. Patient Consent Given: Yes Education handout provided: Previously provided Muscles treated: L glute medius & minimus Electrical stimulation performed: No Parameters: N/A Treatment response/outcome: Twitch Response Elicited and Palpable Increase in Muscle Length STM/DTM, manual TPR and pin & stretch to muscles addressed with DN MET to correct apparent L anterior innominate rotation of pelvis on sacrum followed by pelvic shotgun - alignment normalized after 2 rounds of MET STM/DTM and manual TPR to L hip flexors   MODALITIES: IFC to B low back/SIJ/upper buttocks, 80-150 Hz, intensity to patient tolerance x 15 min Moist heat pack to B low back/SIJ/upper buttocks x 15 min   02/20/22 THERAPEUTIC EXERCISE: to improve flexibility, strength and mobility.  Verbal and tactile cues throughout for technique. Rec Bike - L3 x 6 min Seated 3-way lumbar flexion stretch with hands supported on green Pball 2 x 30" each position Seated R/L QL stretch 2 x 30" Bridge + GTB hip ABD isometric 5 x 5" - discontinued d/t increased pain in L upper hip/buttock TrA + GTB alt bent-knee fallout 7 x 3" - - discontinued d/t increased pain in L upper hip/buttock  MANUAL THERAPY: To promote normalized muscle tension, improved flexibility, and reduced pain. STM & manual TPR to L medial glutes  SELF CARE: Provided instruction in self-STM techniques to glutes/piriformis using tennis ball on wall.  MODALITIES: Mechanical intermittent lumbar traction - neutral pull in hooklying - 60#/40# - 60 sec on/20 sec rest x 12 min (pt weight ~140#)   PATIENT EDUCATION:  Education details: self-STM techniques to L glutes/piriformis using foam roller Person educated: Patient Education method: Explanation and Handouts Education comprehension: verbalized understanding   HOME EXERCISE PROGRAM:  Access Code: [email or web address] URL: [email or web address] Date:  02/27/2022 Prepared by: Annie Paras  Exercises - Seated Flexion Stretch with Swiss Ball  - 2-3 x daily - 7 x weekly - 3 reps - 30 sec hold - Seated Thoracic Flexion and Rotation with Swiss Ball  - 2-3 x daily - 7 x weekly - 3 reps - 30 sec hold - Supine Figure 4 Piriformis Stretch  - 2 x daily - 7 x weekly - 2 sets - 2 reps - 30 sec hold - Supine Double Knee to Chest Modified  - 2-3 x daily - 7 x weekly - 3 reps - 30 sec hold - Supine Lower Trunk Rotation  - 2-3 x daily - 7 x weekly - 5 reps - 10 sec hold - Supine Bridge with Mini Swiss Ball Between Knees  - 1 x daily - 3 x weekly - 3 sets - 5 reps - 5 sec hold - Seated Diagonal Chop with Medicine Ball  - 1 x daily - 3 x weekly - 2 sets - 10 reps - Seated Shoulder Row with Anchored Resistance  - 1 x daily - 3 x weekly - 2 sets - 10 reps - 3-5 hold hold - Seated Shoulder Extension and Scapular Retraction with Resistance  - 1 x daily - 3 x weekly - 2 sets - 10 reps - 3-5 sec hold - Static Prone on Elbows  - 2 x daily - 7 x weekly - 2 sets - 10  reps - 3 sec hold - Prone Press Up  - 2 x daily - 7 x weekly - 2 sets - 10 reps - 2 sec hold - Standing Lumbar Extension with Counter  - 2 x daily - 7 x weekly - 2 sets - 10 reps - 3 sec hold - Standing Anti-Rotation Press with Anchored Resistance  - 1 x daily - 7 x weekly - 3 sets - 10 reps - Seated March with Resistance  - 1 x daily - 7 x weekly - 3 sets - 10 reps - Lying Prone  - 3-4 x daily - 7 x weekly - 1 sets - 1 reps - 5 min hold - Prone Hip Extension with Pillow Under Abdomen  - 1 x daily - 7 x weekly - 1-3 sets - 10 reps - Seated Quadratus Lumborum Stretch in Chair  - 2-3 x daily - 7 x weekly - 3 reps - 30 sec hold - Supine Bridge with Resistance Band  - 1 x daily - 3 x weekly - 3 sets - 5 reps - 5 sec hold - Clam with Resistance  - 1 x daily - 3 x weekly - 3 sets - 5 reps - 3-5 sec hold - Piriformis Mobilization on Foam Roll  - 1 x daily - 7 x weekly - 1-2 reps - 1-2 min hold - Gluteus  Mobilization with Foam Roll  - 1 x daily - 7 x weekly - 1-2 reps - 1-2 min hold  Patient Education - Posture and Body Mechanics - TENS Therapy   ASSESSMENT:  CLINICAL IMPRESSION: [place] reports benefit from DN with increased walking tolerance and loss of the heavy feeling in her leg while walking. Pain did seem to shift to more R-sided buttock/groin at the time but currently midline low back. SIJ reassessed with alignment remaining neutral. She notes more relief of pain with flexion based stretches normally but reports R lateral forward lumbar flexion stretch triggering the L buttock and sciatic-like pain today, therefore reviewed options for self-STM including use of foam roller as she states she purchased one from a thrift store recently. Resumed proximal LE strengthening w/o pain initially but pain noted with increased reps - likely fatigue related as pt able to complete all exercises w/o increased pain if broken into smaller sets of fewer reps but increased total sets. Pt encouraged to attempt this with her HEP, gradually increasing the number of reps per set as long as it does not increased her pain.  OBJECTIVE IMPAIRMENTS: decreased activity tolerance, decreased knowledge of condition, decreased mobility, difficulty walking, decreased strength, hypomobility, increased fascial restrictions, impaired perceived functional ability, increased muscle spasms, impaired flexibility, impaired sensation, improper body mechanics, postural dysfunction, and pain.   ACTIVITY LIMITATIONS: carrying, lifting, bending, sitting, standing, squatting, stairs, and locomotion level  PARTICIPATION LIMITATIONS: meal prep, cleaning, laundry, driving, and occupation  PERSONAL FACTORS: Past/current experiences, Profession, Time since onset of injury/illness/exacerbation, and 3+ comorbidities: GERD, migraines, cervix surgery, hernia repair, orthostatic hypotension, chronic neck and back pain  are also affecting patient's  functional outcome.   REHAB POTENTIAL: Good  CLINICAL DECISION MAKING: Stable/uncomplicated  EVALUATION COMPLEXITY: Low   GOALS: Goals reviewed with patient? Yes  SHORT TERM GOALS: Target date: 12/30/2021   Patient will be independent with initial HEP to improve outcomes and carryover.  Baseline:  Goal status: MET  12/20/21  2.  Patient will report centralization of radicular symptoms.  Baseline:  Goal status: MET  12/23/21 - Pain not typically extending beyond  hip at this point  LONG TERM GOALS: Target date: 11/202023    Patient will be independent with ongoing/advanced HEP for self-management at home.  Baseline:  Goal status: IN PROGRESS 02/10/22  2.  Patient will report 50-75% improvement in low back and L LE radicular pain to improve QOL.  Baseline:  Goal status: REVISED  01/09/22 - 90% improvement per patient, increased pain since 02/03/22  3.  Patient to demonstrate ability to achieve and maintain good spinal alignment/posturing and body mechanics needed for daily activities. Baseline:  Goal status: IN PROGRESS  02/10/22 - feels some relief with neutral spine  4.  Patient will demonstrate full pain free lumbar ROM to perform ADLs.   Baseline:  Goal status: MET 02/10/22  5.  Patient will demonstrate improved B LE strength to >/= 4+/5 for improved stability and ease of mobility . Baseline:  Goal status: IN PROGRESS 02/10/22  6.  Patient will report 50 on lumbar FOTO to demonstrate improved functional ability.  Baseline: 40 eval; 44 02/10/22 Goal status: IN PROGRESS   7.  Patient will tolerate 30+ min of walking to allow her to resume walking for exercise. Baseline:  Goal status: IN PROGRESS  01/09/22 - pt reports she is only able to tolerate 15 min of walking at this point; 02/10/22 - up to 15 min but last 5-10 min hurt.   PLAN: PT FREQUENCY: 2x/week  PT DURATION: 6 weeks  PLANNED INTERVENTIONS: Therapeutic exercises, Therapeutic activity, Neuromuscular re-education,  Balance training, Gait training, Patient/Family education, Self Care, Joint mobilization, Aquatic Therapy, Dry Needling, Electrical stimulation, Spinal mobilization, Cryotherapy, Moist heat, Taping, Traction, Ultrasound, Manual therapy, and Re-evaluation.  PLAN FOR NEXT SESSION:  reassess SIJ alignment PRN; focus on abdominals, lumbar strength, maintaining neutral spine with [place], PT 02/27/2022, 12:34 PM

## 2022-03-02 NOTE — Therapy (Signed)
OUTPATIENT PHYSICAL THERAPY TREATMENT   Patient Name: Carla Hines MRN: [id number] DOB:12/24/1965, 56 y.o., female Today's Date: 03/03/2022   PT End of Session - 03/03/22 0850     Visit Number 16    Number of Visits 23    Date for PT Re-Evaluation 03/24/22    Authorization Type [employer]    Authorization Time Period 12/23/21 - 06/21/22    Authorization - Number of Visits 66    PT Start Time 0847    PT Stop Time 0929    PT Time Calculation (min) 42 min    Activity Tolerance Patient tolerated treatment well    Behavior During Therapy [facility] for tasks assessed/performed                     Past Medical History:  Diagnosis Date   Acid reflux    Migraines    Past Surgical History:  Procedure Laterality Date   CERVIX SURGERY     HERNIA REPAIR     There are no problems to display for this patient.   PCP: Leota Jacobsen, MD  REFERRING PROVIDER: Lowella Petties, MD  REFERRING DIAG: M54.50 (ICD-10-CM) - Low back pain  RATIONALE FOR EVALUATION AND TREATMENT: Rehabilitation  THERAPY DIAG:  Other low back pain  Muscle weakness (generalized)  Difficulty in walking, not elsewhere classified  Cervicalgia  Chronic midline low back pain with bilateral sciatica  Muscle spasm of back  Other muscle spasm  ONSET DATE: 01/2020 (work-related injury)  NEXT MD VISIT: 2 months (just saw him week of 02/03/22)   SUBJECTIVE:                                                                                                                                                                                           SUBJECTIVE STATEMENT: Patient reports she was walking up her incline driveway and she experienced pain in the R low back and then weakness into the leg.  PAIN:  Are you having pain? Yes: NPRS scale: 2/10 Pain location: midline low back Pain description: sharp  PERTINENT HISTORY: GERD, migraines, cervix surgery, hernia repair, orthostatic  hypotension, chronic neck and back pain  PRECAUTIONS: None  WEIGHT BEARING RESTRICTIONS: No  FALLS:  Has patient fallen in last 6 months? No  LIVING ENVIRONMENT: Lives with: lives with their family and lives with their spouse Lives in: House/apartment Stairs: Yes: External: 1 steps; none (basement present but unusable) Has following equipment at home:  walking stick  OCCUPATION: Applying for disability  PLOF: Independent and Leisure: camping 1x/mo, church, walking qod (30-60 min)     PATIENT GOALS: "To  be able to get back to walking 30-60 min and be able to get back to being able to learn how to swim."   OBJECTIVE:   DIAGNOSTIC FINDINGS:  05/02/21 - Lumbar Spine CT: IMPRESSION:  1. No acute osseous abnormality.  2. Mild degenerative changes at L4-L5    PATIENT SURVEYS:  FOTO Lumbar = 40; predicted = 50 FOTO lumbar = 44 02/10/22  SCREENING FOR RED FLAGS: Bowel or bladder incontinence: No Spinal tumors: No Cauda equina syndrome: No Compression fracture: No Abdominal aneurysm: No  COGNITION:  Overall cognitive status: Within functional limits for tasks assessed     SENSATION: WFL Intermittent pain, numbness and tingling down L LE  MUSCLE LENGTH: Hamstrings: mild tight B ITB: mild tight B Piriformis: mild tight B Hip flexors: mild tight B Quads: mild tight B  POSTURE:  rounded shoulders, forward head, and increased lumbar lordosis  PALPATION: Increased muscle tension and TTP in B lumbar paraspinals and L>R glutes > piriformis.   Lumbar spine hypomobile with CPAs.  LUMBAR ROM:   Active  AROM  eval 12/30/21 02/10/22  Flexion Hands to ankles - tight Hands to ankles - mild pain  WNL  Extension WFL but pain at end ROM [facility]- mild pain WNL  Right lateral flexion [facility]- mild pain WNL  Left lateral flexion [facility] WFL- mild pain WNL  Right rotation [facility] WNL  Left rotation [facility] WFL WNL   (Blank rows = not tested)  LOWER EXTREMITY ROM:    [facility]  LOWER EXTREMITY  MMT:    MMT Right eval Left eval Right 01/07/22 Left 01/07/22 Right 02/10/22 Left 02/10/22  Hip flexion 4 4- 4+ 4 4+ 4+  Hip extension 3+ 3+   4+* 4+*  Hip abduction 4 4- 4+ 4 5 4+  Hip adduction 3+ 3+ 4- 4- 5 5  Hip internal rotation 4+ 4+   5 5  Hip external rotation 4- 4- 4+ 4+ 4+ 4+  Knee flexion 4+ 4 4+ 4 4+ 4  Knee extension 4+ 4+   5 5  Ankle dorsiflexion 4+ _0 Ankle plantarflexion 4- 7 SLS HR 4- 8 SLS HR      Ankle inversion        Ankle eversion         (Blank rows = not tested)  LUMBAR SPECIAL TESTS:  Straight leg raise test: Positive bilaterally   TODAY'S TREATMENT:  03/03/22 THERAPEUTIC EXERCISE: to improve flexibility, strength and mobility.  Verbal and tactile cues throughout for technique. Treadmill - 0.6 mph at 2.5 incline x 6 min Standing lumbar ext 5 sec hold x 5 POE x 1 min (pain is almost gone in left thigh) PPU x 5 cues to keep neck in alignment The following exercises were done 5 reps ea in order, then repeated for 2nd set: Bridge + GTB hip ABD isometric 5 x 3", 2 sets - limited lift on 1st set, full lift on 2nd set - no increased pain when reps limited to sets of 5 TrA+ hooklying GTB clam 5 x 3" TrA + hooklying GTB alt bent-knee fallout 5 x 3", 2 sets TrA + hooklying GTB alt bent-knee march 5 x 3", 2 sets  R side-lying L GTB clam 3 x 3", 1 sets pt reports pain in L lateral thigh; then did 6 reps no band until pain came SDLY hip ABD x 5 3" hold Prone lying over pillow hip ext 3" hold x 5 ea (left side  more difficult) Prone mule kick R x 5 reps; unable to lift L  Left hip flexor stretch off EOB x 30 sec, with strap x 30 sec  02/27/22 THERAPEUTIC EXERCISE: to improve flexibility, strength and mobility.  Verbal and tactile cues throughout for technique. Treadmill - 0.9 mph x 6 min Seated 3-way lumbar flexion stretch with hands supported on green Pball 2 x 30" each position - pt noting L sciatic-type pain triggered with R lateral stretch Foam  rolling to B glutes and piriformis Bridge + GTB hip ABD isometric 5 x 3", 2 sets - limited lift on 1st set, full lift on 2nd set - no increased pain when reps limited to sets of 5 TrA+ hooklying GTB clam 5 x 3" TrA + hooklying GTB alt bent-knee fallout 5 x 3", 2 sets R side-lying L GTB clam 5 x 3", 2 sets TrA + hooklying GTB alt bent-knee march 5 x 3", 2 sets  SELF CARE: Provided instruction in self-STM techniques to L glutes and piriformis using foam roller.   02/24/22 THERAPEUTIC EXERCISE: to improve flexibility, strength and mobility.  Verbal and tactile cues throughout for technique. Treadmill - 0.9 mph x 6 min  MANUAL THERAPY: To promote normalized muscle tension, improved flexibility, and reduced pain. Skilled palpation and monitoring of soft tissue during DN Trigger Point Dry-Needling  Treatment instructions: Expect mild to moderate muscle soreness.  Patient verbalized understanding of these instructions and education. Patient Consent Given: Yes Education handout provided: Previously provided Muscles treated: L glute medius & minimus Electrical stimulation performed: No Parameters: N/A Treatment response/outcome: Twitch Response Elicited and Palpable Increase in Muscle Length STM/DTM, manual TPR and pin & stretch to muscles addressed with DN MET to correct apparent L anterior innominate rotation of pelvis on sacrum followed by pelvic shotgun - alignment normalized after 2 rounds of MET STM/DTM and manual TPR to L hip flexors   MODALITIES: IFC to B low back/SIJ/upper buttocks, 80-150 Hz, intensity to patient tolerance x 15 min Moist heat pack to B low back/SIJ/upper buttocks x 15 min   02/20/22 THERAPEUTIC EXERCISE: to improve flexibility, strength and mobility.  Verbal and tactile cues throughout for technique. Rec Bike - L3 x 6 min Seated 3-way lumbar flexion stretch with hands supported on green Pball 2 x 30" each position Seated R/L QL stretch 2 x 30" Bridge + GTB hip  ABD isometric 5 x 5" - discontinued d/t increased pain in L upper hip/buttock TrA + GTB alt bent-knee fallout 7 x 3" - - discontinued d/t increased pain in L upper hip/buttock  MANUAL THERAPY: To promote normalized muscle tension, improved flexibility, and reduced pain. STM & manual TPR to L medial glutes  SELF CARE: Provided instruction in self-STM techniques to glutes/piriformis using tennis ball on wall.  MODALITIES: Mechanical intermittent lumbar traction - neutral pull in hooklying - 60#/40# - 60 sec on/20 sec rest x 12 min (pt weight ~140#)   PATIENT EDUCATION:  Education details: HEP progression Person educated: Patient Education method: Explanation and Handouts Education comprehension: verbalized understanding and returned demonstration   HOME EXERCISE PROGRAM:  Access Code: [email or web address] URL: [email or web address] Date: 03/03/2022 Prepared by: Almyra Free  Exercises - Seated Flexion Stretch with Swiss Ball  - 2-3 x daily - 7 x weekly - 3 reps - 30 sec hold - Seated Thoracic Flexion and Rotation with Swiss Ball  - 2-3 x daily - 7 x weekly - 3 reps - 30 sec hold - Supine Figure 4 Piriformis Stretch  - 2  x daily - 7 x weekly - 2 sets - 2 reps - 30 sec hold - Supine Double Knee to Chest Modified  - 2-3 x daily - 7 x weekly - 3 reps - 30 sec hold - Supine Lower Trunk Rotation  - 2-3 x daily - 7 x weekly - 5 reps - 10 sec hold - Supine Bridge with Mini Swiss Ball Between Knees  - 1 x daily - 3 x weekly - 3 sets - 5 reps - 5 sec hold - Seated Diagonal Chop with Medicine Ball  - 1 x daily - 3 x weekly - 2 sets - 10 reps - Seated Shoulder Row with Anchored Resistance  - 1 x daily - 3 x weekly - 2 sets - 10 reps - 3-5 hold hold - Seated Shoulder Extension and Scapular Retraction with Resistance  - 1 x daily - 3 x weekly - 2 sets - 10 reps - 3-5 sec hold - Static Prone on Elbows  - 2 x daily - 7 x weekly - 2 sets - 10 reps - 3 sec hold - Prone Press Up  - 2 x daily - 7 x weekly  - 2 sets - 10 reps - 2 sec hold - Standing Lumbar Extension with Counter  - 2 x daily - 7 x weekly - 2 sets - 10 reps - 3 sec hold - Standing Anti-Rotation Press with Anchored Resistance  - 1 x daily - 7 x weekly - 3 sets - 10 reps - Seated March with Resistance  - 1 x daily - 7 x weekly - 3 sets - 10 reps - Lying Prone  - 3-4 x daily - 7 x weekly - 1 sets - 1 reps - 5 min hold - Prone Hip Extension with Pillow Under Abdomen  - 1 x daily - 7 x weekly - 1-3 sets - 10 reps - Seated Quadratus Lumborum Stretch in Chair  - 2-3 x daily - 7 x weekly - 3 reps - 30 sec hold - Supine Bridge with Resistance Band  - 1 x daily - 3 x weekly - 3 sets - 5 reps - 5 sec hold - Clam with Resistance  - 1 x daily - 3 x weekly - 3 sets - 5 reps - 3-5 sec hold - Piriformis Mobilization on Foam Roll  - 1 x daily - 7 x weekly - 1-2 reps - 1-2 min hold - Gluteus Mobilization with Foam Roll  - 1 x daily - 7 x weekly - 1-2 reps - 1-2 min hold - Supine Quadriceps Stretch with Strap on Table  - 2 x daily - 7 x weekly - 1 sets - 3 reps - 30-60 sec hold  Patient Education - Posture and Body Mechanics - TENS Therapy     ASSESSMENT:  CLINICAL IMPRESSION: Annjanette reports increased radicular pain and weakness when walking up her incline driveway. We discussed trying to maintain a more neutral posture and she was able to tolerate walking on a 2.5 incline on the TM today without difficulty. She did need to slow her speed. She tolerated TE well with smaller sets. She had difficulty with prone hip ext and mule kicks due to tight left HF. This was added to her HEP which is quite extensive. Advised pt to bring in her papers so we can either consolidate or divide up for her to have more manageable experience. [place] continues to demonstrate potential for improvement and would benefit from continued skilled therapy to  address impairments.    OBJECTIVE IMPAIRMENTS: decreased activity tolerance, decreased knowledge of condition, decreased  mobility, difficulty walking, decreased strength, hypomobility, increased fascial restrictions, impaired perceived functional ability, increased muscle spasms, impaired flexibility, impaired sensation, improper body mechanics, postural dysfunction, and pain.   ACTIVITY LIMITATIONS: carrying, lifting, bending, sitting, standing, squatting, stairs, and locomotion level  PARTICIPATION LIMITATIONS: meal prep, cleaning, laundry, driving, and occupation  PERSONAL FACTORS: Past/current experiences, Profession, Time since onset of injury/illness/exacerbation, and 3+ comorbidities: GERD, migraines, cervix surgery, hernia repair, orthostatic hypotension, chronic neck and back pain  are also affecting patient's functional outcome.   REHAB POTENTIAL: Good  CLINICAL DECISION MAKING: Stable/uncomplicated  EVALUATION COMPLEXITY: Low   GOALS: Goals reviewed with patient? Yes  SHORT TERM GOALS: Target date: 12/30/2021   Patient will be independent with initial HEP to improve outcomes and carryover.  Baseline:  Goal status: MET  12/20/21  2.  Patient will report centralization of radicular symptoms.  Baseline:  Goal status: MET  12/23/21 - Pain not typically extending beyond hip at this point  [place]: Target date: 11/202023    Patient will be independent with ongoing/advanced HEP for self-management at home.  Baseline:  Goal status: IN PROGRESS 02/10/22  2.  Patient will report 50-75% improvement in low back and L LE radicular pain to improve QOL.  Baseline:  Goal status: REVISED  01/09/22 - 90% improvement per patient, increased pain since 02/03/22  3.  Patient to demonstrate ability to achieve and maintain good spinal alignment/posturing and body mechanics needed for daily activities. Baseline:  Goal status: IN PROGRESS  02/10/22 - feels some relief with neutral spine  4.  Patient will demonstrate full pain free lumbar ROM to perform ADLs.   Baseline:  Goal status: MET 02/10/22  5.   Patient will demonstrate improved B LE strength to >/= 4+/5 for improved stability and ease of mobility . Baseline:  Goal status: IN PROGRESS 02/10/22  6.  Patient will report 50 on lumbar FOTO to demonstrate improved functional ability.  Baseline: 40 eval; 44 02/10/22 Goal status: IN PROGRESS   7.  Patient will tolerate 30+ min of walking to allow her to resume walking for exercise. Baseline:  Goal status: IN PROGRESS  01/09/22 - pt reports she is only able to tolerate 15 min of walking at this point; 02/10/22 - up to 15 min but last 5-10 min hurt.   PLAN: PT FREQUENCY: 2x/week  PT DURATION: 6 weeks  PLANNED INTERVENTIONS: Therapeutic exercises, Therapeutic activity, Neuromuscular re-education, Balance training, Gait training, Patient/Family education, Self Care, Joint mobilization, Aquatic Therapy, Dry Needling, Electrical stimulation, Spinal mobilization, Cryotherapy, Moist heat, Taping, Traction, Ultrasound, Manual therapy, and Re-evaluation.  PLAN FOR NEXT SESSION:  Consolidate or divide up HEP for pt, reassess SIJ alignment PRN; focus on abdominals, lumbar strength, maintaining neutral spine with ADLS  Veniamin Kincaid, PT 03/03/2022, 12:17 PM

## 2022-03-03 ENCOUNTER — Encounter: Payer: Self-pay | Admitting: Physical Therapy

## 2022-03-03 ENCOUNTER — Ambulatory Visit: Payer: Medicaid Other | Admitting: Physical Therapy

## 2022-03-03 DIAGNOSIS — M542 Cervicalgia: Secondary | ICD-10-CM

## 2022-03-03 DIAGNOSIS — M62838 Other muscle spasm: Secondary | ICD-10-CM

## 2022-03-03 DIAGNOSIS — M6281 Muscle weakness (generalized): Secondary | ICD-10-CM

## 2022-03-03 DIAGNOSIS — M5459 Other low back pain: Secondary | ICD-10-CM

## 2022-03-03 DIAGNOSIS — G8929 Other chronic pain: Secondary | ICD-10-CM

## 2022-03-03 DIAGNOSIS — R262 Difficulty in walking, not elsewhere classified: Secondary | ICD-10-CM

## 2022-03-03 DIAGNOSIS — M62830 Muscle spasm of back: Secondary | ICD-10-CM

## 2022-03-07 ENCOUNTER — Ambulatory Visit: Payer: Medicaid Other | Attending: Orthopedic Surgery

## 2022-03-07 DIAGNOSIS — M5442 Lumbago with sciatica, left side: Secondary | ICD-10-CM | POA: Insufficient documentation

## 2022-03-07 DIAGNOSIS — M62838 Other muscle spasm: Secondary | ICD-10-CM | POA: Insufficient documentation

## 2022-03-07 DIAGNOSIS — G8929 Other chronic pain: Secondary | ICD-10-CM | POA: Insufficient documentation

## 2022-03-07 DIAGNOSIS — M5459 Other low back pain: Secondary | ICD-10-CM | POA: Insufficient documentation

## 2022-03-07 DIAGNOSIS — M5441 Lumbago with sciatica, right side: Secondary | ICD-10-CM | POA: Insufficient documentation

## 2022-03-07 DIAGNOSIS — M6281 Muscle weakness (generalized): Secondary | ICD-10-CM | POA: Insufficient documentation

## 2022-03-07 DIAGNOSIS — R262 Difficulty in walking, not elsewhere classified: Secondary | ICD-10-CM | POA: Insufficient documentation

## 2022-03-07 DIAGNOSIS — M542 Cervicalgia: Secondary | ICD-10-CM | POA: Insufficient documentation

## 2022-03-07 DIAGNOSIS — M62830 Muscle spasm of back: Secondary | ICD-10-CM | POA: Insufficient documentation

## 2022-03-07 NOTE — Therapy (Signed)
OUTPATIENT PHYSICAL THERAPY TREATMENT   Patient Name: Janessa Mickle MRN: [id number] DOB:07-Apr-1966, 56 y.o., female Today's Date: 03/03/2022   PT End of Session - 03/03/22 0850     Visit Number 16    Number of Visits 23    Date for PT Re-Evaluation 03/24/22    Authorization Type [employer]    Authorization Time Period 12/23/21 - 06/21/22    Authorization - Number of Visits 48    PT Start Time 0847    PT Stop Time 0929    PT Time Calculation (min) 42 min    Activity Tolerance Patient tolerated treatment well    Behavior During Therapy [facility] for tasks assessed/performed                     Past Medical History:  Diagnosis Date   Acid reflux    Migraines    Past Surgical History:  Procedure Laterality Date   CERVIX SURGERY     HERNIA REPAIR     There are no problems to display for this patient.   PCP: Leota Jacobsen, MD  REFERRING PROVIDER: Lowella Petties, MD  REFERRING DIAG: M54.50 (ICD-10-CM) - Low back pain  RATIONALE FOR EVALUATION AND TREATMENT: Rehabilitation  THERAPY DIAG:  Other low back pain  Muscle weakness (generalized)  Difficulty in walking, not elsewhere classified  Cervicalgia  Chronic midline low back pain with bilateral sciatica  Muscle spasm of back  Other muscle spasm  ONSET DATE: 01/2020 (work-related injury)  NEXT MD VISIT: 2 months (just saw him week of 02/03/22)   SUBJECTIVE:                                                                                                                                                                                           SUBJECTIVE STATEMENT: Patient reports she still is noticing issues walking up her driveway d/t the hill.  PAIN:  Are you having pain? Yes: NPRS scale: 2/10 Pain location: midline low back Pain description: sharp  PERTINENT HISTORY: GERD, migraines, cervix surgery, hernia repair, orthostatic hypotension, chronic neck and back pain  PRECAUTIONS:  None  WEIGHT BEARING RESTRICTIONS: No  FALLS:  Has patient fallen in last 6 months? No  LIVING ENVIRONMENT: Lives with: lives with their family and lives with their spouse Lives in: House/apartment Stairs: Yes: External: 1 steps; none (basement present but unusable) Has following equipment at home:  walking stick  OCCUPATION: Applying for disability  PLOF: Independent and Leisure: camping 1x/mo, church, walking qod (30-60 min)     PATIENT GOALS: "To be able to get back to walking 30-60 min and  be able to get back to being able to learn how to swim."   OBJECTIVE:   DIAGNOSTIC FINDINGS:  05/02/21 - Lumbar Spine CT: IMPRESSION:  1. No acute osseous abnormality.  2. Mild degenerative changes at L4-L5    PATIENT SURVEYS:  FOTO Lumbar = 40; predicted = 50 FOTO lumbar = 44 02/10/22  SCREENING FOR RED FLAGS: Bowel or bladder incontinence: No Spinal tumors: No Cauda equina syndrome: No Compression fracture: No Abdominal aneurysm: No  COGNITION:  Overall cognitive status: Within functional limits for tasks assessed     SENSATION: WFL Intermittent pain, numbness and tingling down L LE  MUSCLE LENGTH: Hamstrings: mild tight B ITB: mild tight B Piriformis: mild tight B Hip flexors: mild tight B Quads: mild tight B  POSTURE:  rounded shoulders, forward head, and increased lumbar lordosis  PALPATION: Increased muscle tension and TTP in B lumbar paraspinals and L>R glutes > piriformis.   Lumbar spine hypomobile with CPAs.  LUMBAR ROM:   Active  AROM  eval 12/30/21 02/10/22  Flexion Hands to ankles - tight Hands to ankles - mild pain  WNL  Extension WFL but pain at end ROM [facility]- mild pain WNL  Right lateral flexion [facility]- mild pain WNL  Left lateral flexion [facility] WFL- mild pain WNL  Right rotation [facility] WNL  Left rotation [facility] WFL WNL   (Blank rows = not tested)  LOWER EXTREMITY ROM:    [facility]  LOWER EXTREMITY MMT:    MMT Right eval Left eval Right 01/07/22  Left 01/07/22 Right 02/10/22 Left 02/10/22  Hip flexion 4 4- 4+ 4 4+ 4+  Hip extension 3+ 3+   4+* 4+*  Hip abduction 4 4- 4+ 4 5 4+  Hip adduction 3+ 3+ 4- 4- 5 5  Hip internal rotation 4+ 4+   5 5  Hip external rotation 4- 4- 4+ 4+ 4+ 4+  Knee flexion 4+ 4 4+ 4 4+ 4  Knee extension 4+ 4+   5 5  Ankle dorsiflexion 4+ _0 Ankle plantarflexion 4- 7 SLS HR 4- 8 SLS HR      Ankle inversion        Ankle eversion         (Blank rows = not tested)  LUMBAR SPECIAL TESTS:  Straight leg raise test: Positive bilaterally   TODAY'S TREATMENT: 03/07/22 THERAPEUTIC EXERCISE: to improve flexibility, strength and mobility.  Verbal and tactile cues throughout for technique. Treadmill - 0.6 mph at 2.5 incline x 6 min Seated marching x 10 bil Seated hip ER AROM 2x5 each leg Step ups 8' 2x5 each leg Standing marches 2# 2x5 bil Standing hip flexion 2# 2x5 bil Seated row 20# 2x10 Seated lat pull 20# 2x10 Standing posterior pelvic rotation with foot on low mat table x 10 L side  03/03/22 THERAPEUTIC EXERCISE: to improve flexibility, strength and mobility.  Verbal and tactile cues throughout for technique. Treadmill - 0.6 mph at 2.5 incline x 6 min Standing lumbar ext 5 sec hold x 5 POE x 1 min (pain is almost gone in left thigh) PPU x 5 cues to keep neck in alignment The following exercises were done 5 reps ea in order, then repeated for 2nd set: Bridge + GTB hip ABD isometric 5 x 3", 2 sets - limited lift on 1st set, full lift on 2nd set - no increased pain when reps limited to sets of 5 TrA+ hooklying GTB clam 5 x 3" TrA +  hooklying GTB alt bent-knee fallout 5 x 3", 2 sets TrA + hooklying GTB alt bent-knee march 5 x 3", 2 sets  R side-lying L GTB clam 3 x 3", 1 sets pt reports pain in L lateral thigh; then did 6 reps no band until pain came SDLY hip ABD x 5 3" hold Prone lying over pillow hip ext 3" hold x 5 ea (left side more difficult) Prone mule kick R x 5 reps; unable to lift L   Left hip flexor stretch off EOB x 30 sec, with strap x 30 sec  02/27/22 THERAPEUTIC EXERCISE: to improve flexibility, strength and mobility.  Verbal and tactile cues throughout for technique. Treadmill - 0.9 mph x 6 min Seated 3-way lumbar flexion stretch with hands supported on green Pball 2 x 30" each position - pt noting L sciatic-type pain triggered with R lateral stretch Foam rolling to B glutes and piriformis Bridge + GTB hip ABD isometric 5 x 3", 2 sets - limited lift on 1st set, full lift on 2nd set - no increased pain when reps limited to sets of 5 TrA+ hooklying GTB clam 5 x 3" TrA + hooklying GTB alt bent-knee fallout 5 x 3", 2 sets R side-lying L GTB clam 5 x 3", 2 sets TrA + hooklying GTB alt bent-knee march 5 x 3", 2 sets  SELF CARE: Provided instruction in self-STM techniques to L glutes and piriformis using foam roller.   02/24/22 THERAPEUTIC EXERCISE: to improve flexibility, strength and mobility.  Verbal and tactile cues throughout for technique. Treadmill - 0.9 mph x 6 min  MANUAL THERAPY: To promote normalized muscle tension, improved flexibility, and reduced pain. Skilled palpation and monitoring of soft tissue during DN Trigger Point Dry-Needling  Treatment instructions: Expect mild to moderate muscle soreness.  Patient verbalized understanding of these instructions and education. Patient Consent Given: Yes Education handout provided: Previously provided Muscles treated: L glute medius & minimus Electrical stimulation performed: No Parameters: N/A Treatment response/outcome: Twitch Response Elicited and Palpable Increase in Muscle Length STM/DTM, manual TPR and pin & stretch to muscles addressed with DN MET to correct apparent L anterior innominate rotation of pelvis on sacrum followed by pelvic shotgun - alignment normalized after 2 rounds of MET STM/DTM and manual TPR to L hip flexors   MODALITIES: IFC to B low back/SIJ/upper buttocks, 80-150 Hz,  intensity to patient tolerance x 15 min Moist heat pack to B low back/SIJ/upper buttocks x 15 min   02/20/22 THERAPEUTIC EXERCISE: to improve flexibility, strength and mobility.  Verbal and tactile cues throughout for technique. Rec Bike - L3 x 6 min Seated 3-way lumbar flexion stretch with hands supported on green Pball 2 x 30" each position Seated R/L QL stretch 2 x 30" Bridge + GTB hip ABD isometric 5 x 5" - discontinued d/t increased pain in L upper hip/buttock TrA + GTB alt bent-knee fallout 7 x 3" - - discontinued d/t increased pain in L upper hip/buttock  MANUAL THERAPY: To promote normalized muscle tension, improved flexibility, and reduced pain. STM & manual TPR to L medial glutes  SELF CARE: Provided instruction in self-STM techniques to glutes/piriformis using tennis ball on wall.  MODALITIES: Mechanical intermittent lumbar traction - neutral pull in hooklying - 60#/40# - 60 sec on/20 sec rest x 12 min (pt weight ~140#)   PATIENT EDUCATION:  Education details: HEP progression Person educated: Patient Education method: Explanation and Handouts Education comprehension: verbalized understanding and returned demonstration   HOME EXERCISE PROGRAM:  Access Code: [email or web address] URL: [email or web address] Date: 03/07/2022 Prepared by: Clarene Essex  Exercises - Seated Flexion Stretch with Swiss Ball  - 2-3 x daily - 7 x weekly - 3 reps - 30 sec hold - Seated Thoracic Flexion and Rotation with Swiss Ball  - 2-3 x daily - 7 x weekly - 3 reps - 30 sec hold - Supine Figure 4 Piriformis Stretch  - 2 x daily - 7 x weekly - 2 sets - 2 reps - 30 sec hold - Supine Lower Trunk Rotation  - 2-3 x daily - 7 x weekly - 5 reps - 10 sec hold - Supine Bridge with Mini Swiss Ball Between Knees  - 1 x daily - 3 x weekly - 3 sets - 5 reps - 5 sec hold - Seated Diagonal Chop with Medicine Ball  - 1 x daily - 3 x weekly - 2 sets - 10 reps - Seated Shoulder Row with Anchored Resistance   - 1 x daily - 3 x weekly - 2 sets - 10 reps - 3-5 hold hold - Seated Shoulder Extension and Scapular Retraction with Resistance  - 1 x daily - 3 x weekly - 2 sets - 10 reps - 3-5 sec hold - Static Prone on Elbows  - 2 x daily - 7 x weekly - 2 sets - 10 reps - 3 sec hold - Prone Press Up  - 2 x daily - 7 x weekly - 2 sets - 10 reps - 2 sec hold - Standing Lumbar Extension with Counter  - 2 x daily - 7 x weekly - 2 sets - 10 reps - 3 sec hold - Standing Anti-Rotation Press with Anchored Resistance  - 1 x daily - 7 x weekly - 3 sets - 10 reps - Prone Hip Extension with Pillow Under Abdomen  - 1 x daily - 7 x weekly - 1-3 sets - 10 reps - Seated Quadratus Lumborum Stretch in Chair  - 2-3 x daily - 7 x weekly - 3 reps - 30 sec hold - Supine Bridge with Resistance Band  - 1 x daily - 3 x weekly - 3 sets - 5 reps - 5 sec hold - Clam with Resistance  - 1 x daily - 3 x weekly - 3 sets - 5 reps - 3-5 sec hold - Piriformis Mobilization on Foam Roll  - 1 x daily - 7 x weekly - 1-2 reps - 1-2 min hold - Gluteus Mobilization with Foam Roll  - 1 x daily - 7 x weekly - 1-2 reps - 1-2 min hold - Supine Quadriceps Stretch with Strap on Table  - 2 x daily - 7 x weekly - 1 sets - 3 reps - 30-60 sec hold  Patient Education - Posture and Body Mechanics - TENS Therapy     ASSESSMENT:  CLINICAL IMPRESSION: Pt continues to note difficulty walking along her driveway d/t an incline along the walkway. Briefly reviewed and consolidated HEP with patient. Today we focused on interventions to help with hip strength when climbing inclines. She was more fatigued in R LE today after the treadmill and hip flexion along with marches. She notes that she was able to walk for 25 min w/o being limited by pain, so she is progressing toward LTG #7   OBJECTIVE IMPAIRMENTS: decreased activity tolerance, decreased knowledge of condition, decreased mobility, difficulty walking, decreased strength, hypomobility, increased fascial  restrictions, impaired perceived functional ability, increased muscle spasms, impaired flexibility, impaired sensation, improper body  mechanics, postural dysfunction, and pain.   ACTIVITY LIMITATIONS: carrying, lifting, bending, sitting, standing, squatting, stairs, and locomotion level  PARTICIPATION LIMITATIONS: meal prep, cleaning, laundry, driving, and occupation  PERSONAL FACTORS: Past/current experiences, Profession, Time since onset of injury/illness/exacerbation, and 3+ comorbidities: GERD, migraines, cervix surgery, hernia repair, orthostatic hypotension, chronic neck and back pain  are also affecting patient's functional outcome.   REHAB POTENTIAL: Good  CLINICAL DECISION MAKING: Stable/uncomplicated  EVALUATION COMPLEXITY: Low   GOALS: Goals reviewed with patient? Yes  SHORT TERM GOALS: Target date: 12/30/2021   Patient will be independent with initial HEP to improve outcomes and carryover.  Baseline:  Goal status: MET  12/20/21  2.  Patient will report centralization of radicular symptoms.  Baseline:  Goal status: MET  12/23/21 - Pain not typically extending beyond hip at this point  [place]: Target date: 11/202023    Patient will be independent with ongoing/advanced HEP for self-management at home.  Baseline:  Goal status: IN PROGRESS 02/10/22  2.  Patient will report 50-75% improvement in low back and L LE radicular pain to improve QOL.  Baseline:  Goal status: REVISED  01/09/22 - 90% improvement per patient, increased pain since 02/03/22  3.  Patient to demonstrate ability to achieve and maintain good spinal alignment/posturing and body mechanics needed for daily activities. Baseline:  Goal status: IN PROGRESS  02/10/22 - feels some relief with neutral spine  4.  Patient will demonstrate full pain free lumbar ROM to perform ADLs.   Baseline:  Goal status: MET 02/10/22  5.  Patient will demonstrate improved B LE strength to >/= 4+/5 for improved stability  and ease of mobility . Baseline:  Goal status: IN PROGRESS 02/10/22  6.  Patient will report 50 on lumbar FOTO to demonstrate improved functional ability.  Baseline: 40 eval; 44 02/10/22 Goal status: IN PROGRESS   7.  Patient will tolerate 30+ min of walking to allow her to resume walking for exercise. Baseline:  Goal status: IN PROGRESS  03/07/22- was able to walk 25 min last week   PLAN: PT FREQUENCY: 2x/week  PT DURATION: 6 weeks  PLANNED INTERVENTIONS: Therapeutic exercises, Therapeutic activity, Neuromuscular re-education, Balance training, Gait training, Patient/Family education, Self Care, Joint mobilization, Aquatic Therapy, Dry Needling, Electrical stimulation, Spinal mobilization, Cryotherapy, Moist heat, Taping, Traction, Ultrasound, Manual therapy, and Re-evaluation.  PLAN FOR NEXT SESSION:  Consolidate or divide up HEP for pt, reassess SIJ alignment PRN; focus on abdominals, lumbar strength, maintaining neutral spine with ADLS  RIDDLES,JULIE, PT 03/03/2022, 12:17 PM

## 2022-03-10 ENCOUNTER — Ambulatory Visit (HOSPITAL_BASED_OUTPATIENT_CLINIC_OR_DEPARTMENT_OTHER): Payer: Medicaid Other | Attending: Orthopedic Surgery | Admitting: Physical Therapy

## 2022-03-10 DIAGNOSIS — M6281 Muscle weakness (generalized): Secondary | ICD-10-CM | POA: Insufficient documentation

## 2022-03-10 DIAGNOSIS — M5459 Other low back pain: Secondary | ICD-10-CM | POA: Diagnosis not present

## 2022-03-10 DIAGNOSIS — R262 Difficulty in walking, not elsewhere classified: Secondary | ICD-10-CM | POA: Insufficient documentation

## 2022-03-10 NOTE — Therapy (Signed)
OUTPATIENT PHYSICAL THERAPY TREATMENT   Patient Name: Carla Hines MRN: [id number] DOB:1965-10-22, 56 y.o., female Today's Date: 03/10/2022   PT End of Session - 03/10/22 0733     Visit Number 18    Number of Visits 23    Date for PT Re-Evaluation 03/24/22    Authorization Type [employer]    Authorization Time Period 12/23/21 - 06/21/22    Authorization - Number of Visits 43    PT Start Time 0733    PT Stop Time 0811    PT Time Calculation (min) 38 min    Activity Tolerance Patient tolerated treatment well    Behavior During Therapy [facility] for tasks assessed/performed                     Past Medical History:  Diagnosis Date   Acid reflux    Migraines    Past Surgical History:  Procedure Laterality Date   CERVIX SURGERY     HERNIA REPAIR     There are no problems to display for this patient.   PCP: Leota Jacobsen, MD  REFERRING PROVIDER: Lowella Petties, MD  REFERRING DIAG: M54.50 (ICD-10-CM) - Low back pain  RATIONALE FOR EVALUATION AND TREATMENT: Rehabilitation  THERAPY DIAG:  Other low back pain  Muscle weakness (generalized)  Difficulty in walking, not elsewhere classified  ONSET DATE: 01/2020 (work-related injury)  NEXT MD VISIT: 2 months (just saw him week of 02/03/22)   SUBJECTIVE:                                                                                                                                                                                           SUBJECTIVE STATEMENT: Patient reports she still is noticing pain when walking up her driveway (the hill).  She reports 30% improvement since starting therapy.   PAIN:  Are you having pain? Yes: NPRS scale: 2/10 Pain location: R low back Pain description: sharp  PERTINENT HISTORY: GERD, migraines, cervix surgery, hernia repair, orthostatic hypotension, chronic neck and back pain  PRECAUTIONS: None  WEIGHT BEARING RESTRICTIONS: No  FALLS:  Has patient fallen in  last 6 months? No  LIVING ENVIRONMENT: Lives with: lives with their family and lives with their spouse Lives in: House/apartment Stairs: Yes: External: 1 steps; none (basement present but unusable) Has following equipment at home:  walking stick  OCCUPATION: Applying for disability  PLOF: Independent and Leisure: camping 1x/mo, church, walking qod (30-60 min)     PATIENT GOALS: "To be able to get back to walking 30-60 min and be able to get back to being able to learn how  to swim."   OBJECTIVE:   DIAGNOSTIC FINDINGS:  05/02/21 - Lumbar Spine CT: IMPRESSION:  1. No acute osseous abnormality.  2. Mild degenerative changes at L4-L5    PATIENT SURVEYS:  FOTO Lumbar = 40; predicted = 50 FOTO lumbar = 44 02/10/22  SCREENING FOR RED FLAGS: Bowel or bladder incontinence: No Spinal tumors: No Cauda equina syndrome: No Compression fracture: No Abdominal aneurysm: No  COGNITION:  Overall cognitive status: Within functional limits for tasks assessed     SENSATION: WFL Intermittent pain, numbness and tingling down L LE  MUSCLE LENGTH: Hamstrings: mild tight B ITB: mild tight B Piriformis: mild tight B Hip flexors: mild tight B Quads: mild tight B  POSTURE:  rounded shoulders, forward head, and increased lumbar lordosis  PALPATION: Increased muscle tension and TTP in B lumbar paraspinals and L>R glutes > piriformis.   Lumbar spine hypomobile with CPAs.  LUMBAR ROM:   Active  AROM  eval 12/30/21 02/10/22  Flexion Hands to ankles - tight Hands to ankles - mild pain  WNL  Extension WFL but pain at end ROM [facility]- mild pain WNL  Right lateral flexion [facility]- mild pain WNL  Left lateral flexion [facility] WFL- mild pain WNL  Right rotation [facility] WNL  Left rotation [facility] WFL WNL   (Blank rows = not tested)  LOWER EXTREMITY ROM:    [facility]  LOWER EXTREMITY MMT:    MMT Right eval Left eval Right 01/07/22 Left 01/07/22 Right 02/10/22 Left 02/10/22  Hip flexion 4 4- 4+ 4 4+ 4+  Hip  extension 3+ 3+   4+* 4+*  Hip abduction 4 4- 4+ 4 5 4+  Hip adduction 3+ 3+ 4- 4- 5 5  Hip internal rotation 4+ 4+   5 5  Hip external rotation 4- 4- 4+ 4+ 4+ 4+  Knee flexion 4+ 4 4+ 4 4+ 4  Knee extension 4+ 4+   5 5  Ankle dorsiflexion 4+ _0 Ankle plantarflexion 4- 7 SLS HR 4- 8 SLS HR      Ankle inversion        Ankle eversion         (Blank rows = not tested)  LUMBAR SPECIAL TESTS:  Straight leg raise test: Positive bilaterally   TODAY'S TREATMENT: 03/10/22: Pt seen for aquatic therapy today.  Treatment took place in water 3.25-4.8 ft in depth at the [facility] pool. Temp of water was 92.  Pt entered/exited the pool via stairs (step through pattern) independently with bilat rail.   Holding onto white barbell:  Walking forward/ backward 5 laps; high knee marching; side stepping (cues to shorten step length) Holding wall:  heel raises and squats x 5 x 3 (moves very cautiously) - cues to engage TrA;  Single leg clam x 10 x 2;  hip abdct/ addct x 10 x 2  TrA set with short blue noodle pull down to LEs x 10  (reported tightening of neck)  At stairs, holding rails:  calf stretch (R/L heel off of step x 2); hamstring stretch with foot on 2nd step x 2 each; fig 4 for piriformis  Walking forward and back throughout session recovery. Pt requires buoyancy for support and to offload joints with strengthening exercises. Viscosity of the water is needed for resistance of strengthening; water current perturbations provides challenge to standing balance unsupported, requiring increased core activation.  03/07/22 THERAPEUTIC EXERCISE: to improve flexibility, strength and mobility.  Verbal and tactile cues  throughout for technique. Treadmill - 0.6 mph at 2.5 incline x 6 min Seated marching x 10 bil Seated hip ER AROM 2x5 each leg Step ups 8' 2x5 each leg Standing marches 2# 2x5 bil Standing hip flexion 2# 2x5 bil Seated row 20# 2x10 Seated lat pull 20# 2x10 Standing  posterior pelvic rotation with foot on low mat table x 10 L side  03/03/22 THERAPEUTIC EXERCISE: to improve flexibility, strength and mobility.  Verbal and tactile cues throughout for technique. Treadmill - 0.6 mph at 2.5 incline x 6 min Standing lumbar ext 5 sec hold x 5 POE x 1 min (pain is almost gone in left thigh) PPU x 5 cues to keep neck in alignment The following exercises were done 5 reps ea in order, then repeated for 2nd set: Bridge + GTB hip ABD isometric 5 x 3", 2 sets - limited lift on 1st set, full lift on 2nd set - no increased pain when reps limited to sets of 5 TrA+ hooklying GTB clam 5 x 3" TrA + hooklying GTB alt bent-knee fallout 5 x 3", 2 sets TrA + hooklying GTB alt bent-knee march 5 x 3", 2 sets  R side-lying L GTB clam 3 x 3", 1 sets pt reports pain in L lateral thigh; then did 6 reps no band until pain came SDLY hip ABD x 5 3" hold Prone lying over pillow hip ext 3" hold x 5 ea (left side more difficult) Prone mule kick R x 5 reps; unable to lift L  Left hip flexor stretch off EOB x 30 sec, with strap x 30 sec  02/27/22 THERAPEUTIC EXERCISE: to improve flexibility, strength and mobility.  Verbal and tactile cues throughout for technique. Treadmill - 0.9 mph x 6 min Seated 3-way lumbar flexion stretch with hands supported on green Pball 2 x 30" each position - pt noting L sciatic-type pain triggered with R lateral stretch Foam rolling to B glutes and piriformis Bridge + GTB hip ABD isometric 5 x 3", 2 sets - limited lift on 1st set, full lift on 2nd set - no increased pain when reps limited to sets of 5 TrA+ hooklying GTB clam 5 x 3" TrA + hooklying GTB alt bent-knee fallout 5 x 3", 2 sets R side-lying L GTB clam 5 x 3", 2 sets TrA + hooklying GTB alt bent-knee march 5 x 3", 2 sets  SELF CARE: Provided instruction in self-STM techniques to L glutes and piriformis using foam roller.   02/24/22 THERAPEUTIC EXERCISE: to improve flexibility, strength and  mobility.  Verbal and tactile cues throughout for technique. Treadmill - 0.9 mph x 6 min  MANUAL THERAPY: To promote normalized muscle tension, improved flexibility, and reduced pain. Skilled palpation and monitoring of soft tissue during DN Trigger Point Dry-Needling  Treatment instructions: Expect mild to moderate muscle soreness.  Patient verbalized understanding of these instructions and education. Patient Consent Given: Yes Education handout provided: Previously provided Muscles treated: L glute medius & minimus Electrical stimulation performed: No Parameters: N/A Treatment response/outcome: Twitch Response Elicited and Palpable Increase in Muscle Length STM/DTM, manual TPR and pin & stretch to muscles addressed with DN MET to correct apparent L anterior innominate rotation of pelvis on sacrum followed by pelvic shotgun - alignment normalized after 2 rounds of MET STM/DTM and manual TPR to L hip flexors   MODALITIES: IFC to B low back/SIJ/upper buttocks, 80-150 Hz, intensity to patient tolerance x 15 min Moist heat pack to B low back/SIJ/upper buttocks x 15  min   02/20/22 THERAPEUTIC EXERCISE: to improve flexibility, strength and mobility.  Verbal and tactile cues throughout for technique. Rec Bike - L3 x 6 min Seated 3-way lumbar flexion stretch with hands supported on green Pball 2 x 30" each position Seated R/L QL stretch 2 x 30" Bridge + GTB hip ABD isometric 5 x 5" - discontinued d/t increased pain in L upper hip/buttock TrA + GTB alt bent-knee fallout 7 x 3" - - discontinued d/t increased pain in L upper hip/buttock  MANUAL THERAPY: To promote normalized muscle tension, improved flexibility, and reduced pain. STM & manual TPR to L medial glutes  SELF CARE: Provided instruction in self-STM techniques to glutes/piriformis using tennis ball on wall.  MODALITIES: Mechanical intermittent lumbar traction - neutral pull in hooklying - 60#/40# - 60 sec on/20 sec rest x 12 min  (pt weight ~140#)   PATIENT EDUCATION:  Education details: HEP progression Person educated: Patient Education method: Explanation and Handouts Education comprehension: verbalized understanding and returned demonstration   HOME EXERCISE PROGRAM:  Access Code: [email or web address] URL: [email or web address] Date: 03/07/2022 Prepared by: Clarene Essex  Exercises - Seated Flexion Stretch with Swiss Ball  - 2-3 x daily - 7 x weekly - 3 reps - 30 sec hold - Seated Thoracic Flexion and Rotation with Swiss Ball  - 2-3 x daily - 7 x weekly - 3 reps - 30 sec hold - Supine Figure 4 Piriformis Stretch  - 2 x daily - 7 x weekly - 2 sets - 2 reps - 30 sec hold - Supine Lower Trunk Rotation  - 2-3 x daily - 7 x weekly - 5 reps - 10 sec hold - Supine Bridge with Mini Swiss Ball Between Knees  - 1 x daily - 3 x weekly - 3 sets - 5 reps - 5 sec hold - Seated Diagonal Chop with Medicine Ball  - 1 x daily - 3 x weekly - 2 sets - 10 reps - Seated Shoulder Row with Anchored Resistance  - 1 x daily - 3 x weekly - 2 sets - 10 reps - 3-5 hold hold - Seated Shoulder Extension and Scapular Retraction with Resistance  - 1 x daily - 3 x weekly - 2 sets - 10 reps - 3-5 sec hold - Static Prone on Elbows  - 2 x daily - 7 x weekly - 2 sets - 10 reps - 3 sec hold - Prone Press Up  - 2 x daily - 7 x weekly - 2 sets - 10 reps - 2 sec hold - Standing Lumbar Extension with Counter  - 2 x daily - 7 x weekly - 2 sets - 10 reps - 3 sec hold - Standing Anti-Rotation Press with Anchored Resistance  - 1 x daily - 7 x weekly - 3 sets - 10 reps - Prone Hip Extension with Pillow Under Abdomen  - 1 x daily - 7 x weekly - 1-3 sets - 10 reps - Seated Quadratus Lumborum Stretch in Chair  - 2-3 x daily - 7 x weekly - 3 reps - 30 sec hold - Supine Bridge with Resistance Band  - 1 x daily - 3 x weekly - 3 sets - 5 reps - 5 sec hold - Clam with Resistance  - 1 x daily - 3 x weekly - 3 sets - 5 reps - 3-5 sec hold - Piriformis  Mobilization on Foam Roll  - 1 x daily - 7 x weekly - 1-2 reps - 1-2 min  hold - Gluteus Mobilization with Foam Roll  - 1 x daily - 7 x weekly - 1-2 reps - 1-2 min hold - Supine Quadriceps Stretch with Strap on Table  - 2 x daily - 7 x weekly - 1 sets - 3 reps - 30-60 sec hold  Patient Education - Posture and Body Mechanics - TENS Therapy     ASSESSMENT:  CLINICAL IMPRESSION: Reviewed basic aquatic exercises and principles.  She reported some increase in R LBP with heel raises and Rt side stepping; reduced with TrA engaged and modifying step length. She moved cautiously through pool today and requested floatation device when away from wall.  She reported improved tolerance for exercises and stairs with TrA engaged.  Progressing towards goals.   OBJECTIVE IMPAIRMENTS: decreased activity tolerance, decreased knowledge of condition, decreased mobility, difficulty walking, decreased strength, hypomobility, increased fascial restrictions, impaired perceived functional ability, increased muscle spasms, impaired flexibility, impaired sensation, improper body mechanics, postural dysfunction, and pain.   ACTIVITY LIMITATIONS: carrying, lifting, bending, sitting, standing, squatting, stairs, and locomotion level  PARTICIPATION LIMITATIONS: meal prep, cleaning, laundry, driving, and occupation  PERSONAL FACTORS: Past/current experiences, Profession, Time since onset of injury/illness/exacerbation, and 3+ comorbidities: GERD, migraines, cervix surgery, hernia repair, orthostatic hypotension, chronic neck and back pain  are also affecting patient's functional outcome.   REHAB POTENTIAL: Good  CLINICAL DECISION MAKING: Stable/uncomplicated  EVALUATION COMPLEXITY: Low   GOALS: Goals reviewed with patient? Yes  SHORT TERM GOALS: Target date: 12/30/2021   Patient will be independent with initial HEP to improve outcomes and carryover.  Baseline:  Goal status: MET  12/20/21  2.  Patient will report  centralization of radicular symptoms.  Baseline:  Goal status: MET  12/23/21 - Pain not typically extending beyond hip at this point  [place]: Target date: 11/202023    Patient will be independent with ongoing/advanced HEP for self-management at home.  Baseline:  Goal status: IN PROGRESS 02/10/22  2.  Patient will report 50-75% improvement in low back and L LE radicular pain to improve QOL.  Baseline:  Goal status: REVISED  01/09/22 - 90% improvement per patient, increased pain since 02/03/22  3.  Patient to demonstrate ability to achieve and maintain good spinal alignment/posturing and body mechanics needed for daily activities. Baseline:  Goal status: IN PROGRESS  02/10/22 - feels some relief with neutral spine  4.  Patient will demonstrate full pain free lumbar ROM to perform ADLs.   Baseline:  Goal status: MET 02/10/22  5.  Patient will demonstrate improved B LE strength to >/= 4+/5 for improved stability and ease of mobility . Baseline:  Goal status: IN PROGRESS 02/10/22  6.  Patient will report 50 on lumbar FOTO to demonstrate improved functional ability.  Baseline: 40 eval; 44 02/10/22 Goal status: IN PROGRESS   7.  Patient will tolerate 30+ min of walking to allow her to resume walking for exercise. Baseline:  Goal status: IN PROGRESS  03/07/22- was able to walk 25 min last week   PLAN: PT FREQUENCY: 2x/week  PT DURATION: 6 weeks  PLANNED INTERVENTIONS: Therapeutic exercises, Therapeutic activity, Neuromuscular re-education, Balance training, Gait training, Patient/Family education, Self Care, Joint mobilization, Aquatic Therapy, Dry Needling, Electrical stimulation, Spinal mobilization, Cryotherapy, Moist heat, Taping, Traction, Ultrasound, Manual therapy, and Re-evaluation.  PLAN FOR NEXT SESSION:  Consolidate or divide up HEP for pt, reassess SIJ alignment PRN; focus on abdominals, lumbar strength, maintaining neutral spine with ADLS  Kerin Perna,  PTA 03/10/22 8:18 AM [facility] [address] [place], [place], [address] Phone: [phone]   Fax:  [phone]

## 2022-03-12 NOTE — Therapy (Signed)
OUTPATIENT PHYSICAL THERAPY TREATMENT   Patient Name: Carla Hines MRN: [id number] DOB:11/17/1965, 56 y.o., female Today's Date: 03/13/2022   PT End of Session - 03/13/22 0954     Visit Number 19    Number of Visits 23    Date for PT Re-Evaluation 03/24/22    Authorization Type [employer]    Authorization Time Period 12/23/21 - 06/21/22    Authorization - Number of Visits 58    PT Start Time 0950    PT Stop Time 1030    PT Time Calculation (min) 40 min    Activity Tolerance Patient tolerated treatment well    Behavior During Therapy [facility] for tasks assessed/performed                     Past Medical History:  Diagnosis Date   Acid reflux    Migraines    Past Surgical History:  Procedure Laterality Date   CERVIX SURGERY     HERNIA REPAIR     There are no problems to display for this patient.   PCP: Leota Jacobsen, MD  REFERRING PROVIDER: Lowella Petties, MD  REFERRING DIAG: M54.50 (ICD-10-CM) - Low back pain  RATIONALE FOR EVALUATION AND TREATMENT: Rehabilitation  THERAPY DIAG:  Other low back pain  Muscle weakness (generalized)  Difficulty in walking, not elsewhere classified  ONSET DATE: 01/2020 (work-related injury)  NEXT MD VISIT: 2 months (just saw him week of 02/03/22)   SUBJECTIVE:                                                                                                                                                                                           SUBJECTIVE STATEMENT: Patient reports having headache after last aquatic session which improved after napping. Walked about 20  ins yesterday before pain increased to lb then around towards groin area.  PAIN:  Are you having pain? Yes: NPRS scale: 2/10 Pain location: R low back Pain description: sharp  PERTINENT HISTORY: GERD, migraines, cervix surgery, hernia repair, orthostatic hypotension, chronic neck and back pain  PRECAUTIONS: None  WEIGHT BEARING  RESTRICTIONS: No  FALLS:  Has patient fallen in last 6 months? No  LIVING ENVIRONMENT: Lives with: lives with their family and lives with their spouse Lives in: House/apartment Stairs: Yes: External: 1 steps; none (basement present but unusable) Has following equipment at home:  walking stick  OCCUPATION: Applying for disability  PLOF: Independent and Leisure: camping 1x/mo, church, walking qod (30-60 min)     PATIENT GOALS: "To be able to get back to walking 30-60 min and be able to get back to  being able to learn how to swim."   OBJECTIVE:   DIAGNOSTIC FINDINGS:  05/02/21 - Lumbar Spine CT: IMPRESSION:  1. No acute osseous abnormality.  2. Mild degenerative changes at L4-L5    PATIENT SURVEYS:  FOTO Lumbar = 40; predicted = 50 FOTO lumbar = 44 02/10/22  SCREENING FOR RED FLAGS: Bowel or bladder incontinence: No Spinal tumors: No Cauda equina syndrome: No Compression fracture: No Abdominal aneurysm: No  COGNITION:  Overall cognitive status: Within functional limits for tasks assessed     SENSATION: WFL Intermittent pain, numbness and tingling down L LE  MUSCLE LENGTH: Hamstrings: mild tight B ITB: mild tight B Piriformis: mild tight B Hip flexors: mild tight B Quads: mild tight B  POSTURE:  rounded shoulders, forward head, and increased lumbar lordosis  PALPATION: Increased muscle tension and TTP in B lumbar paraspinals and L>R glutes > piriformis.   Lumbar spine hypomobile with CPAs.  LUMBAR ROM:   Active  AROM  eval 12/30/21 02/10/22  Flexion Hands to ankles - tight Hands to ankles - mild pain  WNL  Extension WFL but pain at end ROM [facility]- mild pain WNL  Right lateral flexion [facility]- mild pain WNL  Left lateral flexion [facility] WFL- mild pain WNL  Right rotation [facility] WNL  Left rotation [facility] WFL WNL   (Blank rows = not tested)  LOWER EXTREMITY ROM:    [facility]  LOWER EXTREMITY MMT:    MMT Right eval Left eval Right 01/07/22 Left 01/07/22 Right 02/10/22  Left 02/10/22  Hip flexion 4 4- 4+ 4 4+ 4+  Hip extension 3+ 3+   4+* 4+*  Hip abduction 4 4- 4+ 4 5 4+  Hip adduction 3+ 3+ 4- 4- 5 5  Hip internal rotation 4+ 4+   5 5  Hip external rotation 4- 4- 4+ 4+ 4+ 4+  Knee flexion 4+ 4 4+ 4 4+ 4  Knee extension 4+ 4+   5 5  Ankle dorsiflexion 4+ _0 Ankle plantarflexion 4- 7 SLS HR 4- 8 SLS HR      Ankle inversion        Ankle eversion         (Blank rows = not tested)  LUMBAR SPECIAL TESTS:  Straight leg raise test: Positive bilaterally   TODAY'S TREATMENT: 03/12/22: Pt seen for aquatic therapy today.  Treatment took place in water 3.25-4.8 ft in depth at the [facility] pool. Temp of water was 92.  Pt entered/exited the pool via stairs (step through pattern) independently with bilat rail.   Holding onto white barbell:  Walking forward/ backward 5 laps; high knee marching; side stepping (cues to shorten step length) Holding wall:  heel raises and squats x x10- cues to engage TrA;  Single leg clam x 10 x 2;  hip abdct/ addct x 10 x 2  TrA set with short blue noodle pull down to LEs 2 x 8  (reported some shoulder discomfort, modified by decreasing submersion)  Kick board row LE staggered R/L then together. Cues for tightened core. X20 ea Gastroc stretch bottom step L stretch holding to wall x 3. 4th rep with hip hiking Step ups leading R/L x 10. Cues for abd bracing   Cycling on noodle 3 widths using  yellow hand buoys for support. Pt requires buoyancy for support and to offload joints with strengthening exercises. Viscosity of the water is needed for resistance of strengthening; water current perturbations provides challenge to  standing balance unsupported, requiring increased core activation.   03/10/22: Pt seen for aquatic therapy today.  Treatment took place in water 3.25-4.8 ft in depth at the [facility] pool. Temp of water was 92.  Pt entered/exited the pool via stairs (step through pattern) independently  with bilat rail.   Holding onto white barbell:  Walking forward/ backward 5 laps; high knee marching; side stepping (cues to shorten step length) Holding wall:  heel raises and squats x 5 x 3 (moves very cautiously) - cues to engage TrA;  Single leg clam x 10 x 2;  hip abdct/ addct x 10 x 2  TrA set with short blue noodle pull down to LEs x 10  (reported tightening of neck)  At stairs, holding rails:  calf stretch (R/L heel off of step x 2); hamstring stretch with foot on 2nd step x 2 each; fig 4 for piriformis  Walking forward and back throughout session recovery. Pt requires buoyancy for support and to offload joints with strengthening exercises. Viscosity of the water is needed for resistance of strengthening; water current perturbations provides challenge to standing balance unsupported, requiring increased core activation.  03/07/22 THERAPEUTIC EXERCISE: to improve flexibility, strength and mobility.  Verbal and tactile cues throughout for technique. Treadmill - 0.6 mph at 2.5 incline x 6 min Seated marching x 10 bil Seated hip ER AROM 2x5 each leg Step ups 8' 2x5 each leg Standing marches 2# 2x5 bil Standing hip flexion 2# 2x5 bil Seated row 20# 2x10 Seated lat pull 20# 2x10 Standing posterior pelvic rotation with foot on low mat table x 10 L side  03/03/22 THERAPEUTIC EXERCISE: to improve flexibility, strength and mobility.  Verbal and tactile cues throughout for technique. Treadmill - 0.6 mph at 2.5 incline x 6 min Standing lumbar ext 5 sec hold x 5 POE x 1 min (pain is almost gone in left thigh) PPU x 5 cues to keep neck in alignment The following exercises were done 5 reps ea in order, then repeated for 2nd set: Bridge + GTB hip ABD isometric 5 x 3", 2 sets - limited lift on 1st set, full lift on 2nd set - no increased pain when reps limited to sets of 5 TrA+ hooklying GTB clam 5 x 3" TrA + hooklying GTB alt bent-knee fallout 5 x 3", 2 sets TrA + hooklying GTB alt bent-knee  march 5 x 3", 2 sets  R side-lying L GTB clam 3 x 3", 1 sets pt reports pain in L lateral thigh; then did 6 reps no band until pain came SDLY hip ABD x 5 3" hold Prone lying over pillow hip ext 3" hold x 5 ea (left side more difficult) Prone mule kick R x 5 reps; unable to lift L  Left hip flexor stretch off EOB x 30 sec, with strap x 30 sec  02/27/22 THERAPEUTIC EXERCISE: to improve flexibility, strength and mobility.  Verbal and tactile cues throughout for technique. Treadmill - 0.9 mph x 6 min Seated 3-way lumbar flexion stretch with hands supported on green Pball 2 x 30" each position - pt noting L sciatic-type pain triggered with R lateral stretch Foam rolling to B glutes and piriformis Bridge + GTB hip ABD isometric 5 x 3", 2 sets - limited lift on 1st set, full lift on 2nd set - no increased pain when reps limited to sets of 5 TrA+ hooklying GTB clam 5 x 3" TrA + hooklying GTB alt bent-knee fallout 5 x 3", 2 sets  R side-lying L GTB clam 5 x 3", 2 sets TrA + hooklying GTB alt bent-knee march 5 x 3", 2 sets  SELF CARE: Provided instruction in self-STM techniques to L glutes and piriformis using foam roller.   02/24/22 THERAPEUTIC EXERCISE: to improve flexibility, strength and mobility.  Verbal and tactile cues throughout for technique. Treadmill - 0.9 mph x 6 min  MANUAL THERAPY: To promote normalized muscle tension, improved flexibility, and reduced pain. Skilled palpation and monitoring of soft tissue during DN Trigger Point Dry-Needling  Treatment instructions: Expect mild to moderate muscle soreness.  Patient verbalized understanding of these instructions and education. Patient Consent Given: Yes Education handout provided: Previously provided Muscles treated: L glute medius & minimus Electrical stimulation performed: No Parameters: N/A Treatment response/outcome: Twitch Response Elicited and Palpable Increase in Muscle Length STM/DTM, manual TPR and pin & stretch to  muscles addressed with DN MET to correct apparent L anterior innominate rotation of pelvis on sacrum followed by pelvic shotgun - alignment normalized after 2 rounds of MET STM/DTM and manual TPR to L hip flexors   MODALITIES: IFC to B low back/SIJ/upper buttocks, 80-150 Hz, intensity to patient tolerance x 15 min Moist heat pack to B low back/SIJ/upper buttocks x 15 min   02/20/22 THERAPEUTIC EXERCISE: to improve flexibility, strength and mobility.  Verbal and tactile cues throughout for technique. Rec Bike - L3 x 6 min Seated 3-way lumbar flexion stretch with hands supported on green Pball 2 x 30" each position Seated R/L QL stretch 2 x 30" Bridge + GTB hip ABD isometric 5 x 5" - discontinued d/t increased pain in L upper hip/buttock TrA + GTB alt bent-knee fallout 7 x 3" - - discontinued d/t increased pain in L upper hip/buttock  MANUAL THERAPY: To promote normalized muscle tension, improved flexibility, and reduced pain. STM & manual TPR to L medial glutes  SELF CARE: Provided instruction in self-STM techniques to glutes/piriformis using tennis ball on wall.  MODALITIES: Mechanical intermittent lumbar traction - neutral pull in hooklying - 60#/40# - 60 sec on/20 sec rest x 12 min (pt weight ~140#)   PATIENT EDUCATION:  Education details: HEP progression Person educated: Patient Education method: Explanation and Handouts Education comprehension: verbalized understanding and returned demonstration   HOME EXERCISE PROGRAM:  Access Code: [email or web address] URL: [email or web address] Date: 03/07/2022 Prepared by: Clarene Essex  Exercises - Seated Flexion Stretch with Swiss Ball  - 2-3 x daily - 7 x weekly - 3 reps - 30 sec hold - Seated Thoracic Flexion and Rotation with Swiss Ball  - 2-3 x daily - 7 x weekly - 3 reps - 30 sec hold - Supine Figure 4 Piriformis Stretch  - 2 x daily - 7 x weekly - 2 sets - 2 reps - 30 sec hold - Supine Lower Trunk Rotation  - 2-3 x daily  - 7 x weekly - 5 reps - 10 sec hold - Supine Bridge with Mini Swiss Ball Between Knees  - 1 x daily - 3 x weekly - 3 sets - 5 reps - 5 sec hold - Seated Diagonal Chop with Medicine Ball  - 1 x daily - 3 x weekly - 2 sets - 10 reps - Seated Shoulder Row with Anchored Resistance  - 1 x daily - 3 x weekly - 2 sets - 10 reps - 3-5 hold hold - Seated Shoulder Extension and Scapular Retraction with Resistance  - 1 x daily - 3 x weekly - 2 sets - 10 reps -  3-5 sec hold - Static Prone on Elbows  - 2 x daily - 7 x weekly - 2 sets - 10 reps - 3 sec hold - Prone Press Up  - 2 x daily - 7 x weekly - 2 sets - 10 reps - 2 sec hold - Standing Lumbar Extension with Counter  - 2 x daily - 7 x weekly - 2 sets - 10 reps - 3 sec hold - Standing Anti-Rotation Press with Anchored Resistance  - 1 x daily - 7 x weekly - 3 sets - 10 reps - Prone Hip Extension with Pillow Under Abdomen  - 1 x daily - 7 x weekly - 1-3 sets - 10 reps - Seated Quadratus Lumborum Stretch in Chair  - 2-3 x daily - 7 x weekly - 3 reps - 30 sec hold - Supine Bridge with Resistance Band  - 1 x daily - 3 x weekly - 3 sets - 5 reps - 5 sec hold - Clam with Resistance  - 1 x daily - 3 x weekly - 3 sets - 5 reps - 3-5 sec hold - Piriformis Mobilization on Foam Roll  - 1 x daily - 7 x weekly - 1-2 reps - 1-2 min hold - Gluteus Mobilization with Foam Roll  - 1 x daily - 7 x weekly - 1-2 reps - 1-2 min hold - Supine Quadriceps Stretch with Strap on Table  - 2 x daily - 7 x weekly - 1 sets - 3 reps - 30-60 sec hold  Patient Education - Posture and Body Mechanics - TENS Therapy     ASSESSMENT:  CLINICAL IMPRESSION: Cues  throughout for upright posture. She is less guarded (cautious) with all movement today indicating decreasing pain/less apprehension. She requires minimal cueing for execution of exercises.  Good sitting balance attained on noodle. She reports reduction in pain through session to 0-1/10.  Tolerates progression of therapy  well.  Reviewed basic aquatic exercises and principles.  She reported some increase in R LBP with heel raises and Rt side stepping; reduced with TrA engaged and modifying step length. She moved cautiously through pool today and requested floatation device when away from wall.  She reported improved tolerance for exercises and stairs with TrA engaged.  Progressing towards goals.   OBJECTIVE IMPAIRMENTS: decreased activity tolerance, decreased knowledge of condition, decreased mobility, difficulty walking, decreased strength, hypomobility, increased fascial restrictions, impaired perceived functional ability, increased muscle spasms, impaired flexibility, impaired sensation, improper body mechanics, postural dysfunction, and pain.   ACTIVITY LIMITATIONS: carrying, lifting, bending, sitting, standing, squatting, stairs, and locomotion level  PARTICIPATION LIMITATIONS: meal prep, cleaning, laundry, driving, and occupation  PERSONAL FACTORS: Past/current experiences, Profession, Time since onset of injury/illness/exacerbation, and 3+ comorbidities: GERD, migraines, cervix surgery, hernia repair, orthostatic hypotension, chronic neck and back pain  are also affecting patient's functional outcome.   REHAB POTENTIAL: Good  CLINICAL DECISION MAKING: Stable/uncomplicated  EVALUATION COMPLEXITY: Low   GOALS: Goals reviewed with patient? Yes  SHORT TERM GOALS: Target date: 12/30/2021   Patient will be independent with initial HEP to improve outcomes and carryover.  Baseline:  Goal status: MET  12/20/21  2.  Patient will report centralization of radicular symptoms.  Baseline:  Goal status: MET  12/23/21 - Pain not typically extending beyond hip at this point  [place]: Target date: 11/202023    Patient will be independent with ongoing/advanced HEP for self-management at home.  Baseline:  Goal status: IN PROGRESS 02/10/22  2.  Patient  will report 50-75% improvement in low back and L LE  radicular pain to improve QOL.  Baseline:  Goal status: REVISED  01/09/22 - 90% improvement per patient, increased pain since 02/03/22  3.  Patient to demonstrate ability to achieve and maintain good spinal alignment/posturing and body mechanics needed for daily activities. Baseline:  Goal status: IN PROGRESS  02/10/22 - feels some relief with neutral spine  4.  Patient will demonstrate full pain free lumbar ROM to perform ADLs.   Baseline:  Goal status: MET 02/10/22  5.  Patient will demonstrate improved B LE strength to >/= 4+/5 for improved stability and ease of mobility . Baseline:  Goal status: IN PROGRESS 02/10/22  6.  Patient will report 50 on lumbar FOTO to demonstrate improved functional ability.  Baseline: 40 eval; 44 02/10/22 Goal status: IN PROGRESS   7.  Patient will tolerate 30+ min of walking to allow her to resume walking for exercise. Baseline:  Goal status: IN PROGRESS  03/07/22- was able to walk 25 min last week   PLAN: PT FREQUENCY: 2x/week  PT DURATION: 6 weeks  PLANNED INTERVENTIONS: Therapeutic exercises, Therapeutic activity, Neuromuscular re-education, Balance training, Gait training, Patient/Family education, Self Care, Joint mobilization, Aquatic Therapy, Dry Needling, Electrical stimulation, Spinal mobilization, Cryotherapy, Moist heat, Taping, Traction, Ultrasound, Manual therapy, and Re-evaluation.  PLAN FOR NEXT SESSION:  Consolidate or divide up HEP for pt, reassess SIJ alignment PRN; focus on abdominals, lumbar strength, maintaining neutral spine with ADLS  Stanton Kidney Tharon Aquas) Cutter Passey MPT 03/13/22 9:55 AM [place], [place], [address] Phone: [phone]   Fax:  [phone]

## 2022-03-13 ENCOUNTER — Encounter (HOSPITAL_BASED_OUTPATIENT_CLINIC_OR_DEPARTMENT_OTHER): Payer: Self-pay | Admitting: Physical Therapy

## 2022-03-13 ENCOUNTER — Ambulatory Visit (HOSPITAL_BASED_OUTPATIENT_CLINIC_OR_DEPARTMENT_OTHER): Payer: Medicaid Other | Admitting: Physical Therapy

## 2022-03-13 DIAGNOSIS — M5459 Other low back pain: Secondary | ICD-10-CM | POA: Diagnosis not present

## 2022-03-13 DIAGNOSIS — M6281 Muscle weakness (generalized): Secondary | ICD-10-CM

## 2022-03-13 DIAGNOSIS — R262 Difficulty in walking, not elsewhere classified: Secondary | ICD-10-CM

## 2022-03-17 ENCOUNTER — Encounter (HOSPITAL_BASED_OUTPATIENT_CLINIC_OR_DEPARTMENT_OTHER): Payer: Self-pay | Admitting: Physical Therapy

## 2022-03-17 ENCOUNTER — Ambulatory Visit (HOSPITAL_BASED_OUTPATIENT_CLINIC_OR_DEPARTMENT_OTHER): Payer: Medicaid Other | Admitting: Physical Therapy

## 2022-03-17 DIAGNOSIS — M5459 Other low back pain: Secondary | ICD-10-CM

## 2022-03-17 DIAGNOSIS — R262 Difficulty in walking, not elsewhere classified: Secondary | ICD-10-CM

## 2022-03-17 DIAGNOSIS — M6281 Muscle weakness (generalized): Secondary | ICD-10-CM

## 2022-03-17 NOTE — Therapy (Signed)
OUTPATIENT PHYSICAL THERAPY TREATMENT   Patient Name: Carla Hines MRN: [id number] DOB:02-28-1966, 56 y.o., female Today's Date: 03/17/2022   PT End of Session - 03/17/22 0744     Visit Number 20    Number of Visits 23    Date for PT Re-Evaluation 03/24/22    Authorization Type [employer]    Authorization Time Period 12/23/21 - 06/21/22    Authorization - Number of Visits 16    PT Start Time 0739   pt arrived late   PT Stop Time 0815    PT Time Calculation (min) 36 min    Activity Tolerance Patient tolerated treatment well    Behavior During Therapy [facility] for tasks assessed/performed                     Past Medical History:  Diagnosis Date   Acid reflux    Migraines    Past Surgical History:  Procedure Laterality Date   CERVIX SURGERY     HERNIA REPAIR     There are no problems to display for this patient.   PCP: Leota Jacobsen, MD  REFERRING PROVIDER: Lowella Petties, MD  REFERRING DIAG: M54.50 (ICD-10-CM) - Low back pain  RATIONALE FOR EVALUATION AND TREATMENT: Rehabilitation  THERAPY DIAG:  Other low back pain  Muscle weakness (generalized)  Difficulty in walking, not elsewhere classified  ONSET DATE: 01/2020 (work-related injury)  NEXT MD VISIT: 2 months (just saw him week of 02/03/22)   SUBJECTIVE:                                                                                                                                                                                           SUBJECTIVE STATEMENT: Patient reports she went camping 1 night with scouts this weekend.  Back didn't hurt as much has it in the past.  Pain up to 4/10; took ibuprofen for relief.   She states that tightening stomach has helped with tolerating hills better.   PAIN:  Are you having pain? Yes: NPRS scale: 3/10 Pain location: bilat low back Pain description: sharp Alleviating factors: laying down Aggravating factors: walking >20 min  PERTINENT  HISTORY: GERD, migraines, cervix surgery, hernia repair, orthostatic hypotension, chronic neck and back pain  PRECAUTIONS: None  WEIGHT BEARING RESTRICTIONS: No  FALLS:  Has patient fallen in last 6 months? No  LIVING ENVIRONMENT: Lives with: lives with their family and lives with their spouse Lives in: House/apartment Stairs: Yes: External: 1 steps; none (basement present but unusable) Has following equipment at home:  walking stick  OCCUPATION: Applying for disability  PLOF: Independent and Leisure:  camping 1x/mo, church, walking qod (30-60 min)     PATIENT GOALS: "To be able to get back to walking 30-60 min and be able to get back to being able to learn how to swim."   OBJECTIVE:   DIAGNOSTIC FINDINGS:  05/02/21 - Lumbar Spine CT: IMPRESSION:  1. No acute osseous abnormality.  2. Mild degenerative changes at L4-L5    PATIENT SURVEYS:  FOTO Lumbar = 40; predicted = 50 FOTO lumbar = 44 02/10/22  SCREENING FOR RED FLAGS: Bowel or bladder incontinence: No Spinal tumors: No Cauda equina syndrome: No Compression fracture: No Abdominal aneurysm: No  COGNITION:  Overall cognitive status: Within functional limits for tasks assessed     SENSATION: WFL Intermittent pain, numbness and tingling down L LE  MUSCLE LENGTH: Hamstrings: mild tight B ITB: mild tight B Piriformis: mild tight B Hip flexors: mild tight B Quads: mild tight B  POSTURE:  rounded shoulders, forward head, and increased lumbar lordosis  PALPATION: Increased muscle tension and TTP in B lumbar paraspinals and L>R glutes > piriformis.   Lumbar spine hypomobile with CPAs.  LUMBAR ROM:   Active  AROM  eval 12/30/21 02/10/22  Flexion Hands to ankles - tight Hands to ankles - mild pain  WNL  Extension WFL but pain at end ROM [facility]- mild pain WNL  Right lateral flexion [facility]- mild pain WNL  Left lateral flexion [facility] WFL- mild pain WNL  Right rotation [facility] WNL  Left rotation [facility] WFL WNL   (Blank  rows = not tested)  LOWER EXTREMITY ROM:    [facility]  LOWER EXTREMITY MMT:    MMT Right eval Left eval Right 01/07/22 Left 01/07/22 Right 02/10/22 Left 02/10/22  Hip flexion 4 4- 4+ 4 4+ 4+  Hip extension 3+ 3+   4+* 4+*  Hip abduction 4 4- 4+ 4 5 4+  Hip adduction 3+ 3+ 4- 4- 5 5  Hip internal rotation 4+ 4+   5 5  Hip external rotation 4- 4- 4+ 4+ 4+ 4+  Knee flexion 4+ 4 4+ 4 4+ 4  Knee extension 4+ 4+   5 5  Ankle dorsiflexion 4+ _0 Ankle plantarflexion 4- 7 SLS HR 4- 8 SLS HR      Ankle inversion        Ankle eversion         (Blank rows = not tested)  LUMBAR SPECIAL TESTS:  Straight leg raise test: Positive bilaterally   TODAY'S TREATMENT: 03/17/22: Pt seen for aquatic therapy today.  Treatment took place in water 3.25-4.8 ft in depth at the [facility] pool. Temp of water was 92.  Pt entered/exited the pool via stairs (step through pattern) independently with bilat rail.   Holding onto white barbell:  Walking forward/ backward 5 laps; high knee marching; side stepping (cues to shorten step length) Holding wall:  heel raises x 15; squats x10- cues to engage TrA; hip abdct/ addct x 15;  Single leg clam x 10;  Holding onto white barbell:  marching backwards/ forwards;  forward walking kicks;   Kick board row LE staggered R/L then together. Cues for tightened core. X 10 each TrA set with short blue noodle pull down to LEs x 10 x 2; trunk rotation holding short noodle x 5 x 2 Cycling on noodle 3 widths using  yellow hand buoys for support    Pt requires buoyancy for support and to offload joints with strengthening exercises. Viscosity  of the water is needed for resistance of strengthening; water current perturbations provides challenge to standing balance unsupported, requiring increased core activation.  03/07/22 THERAPEUTIC EXERCISE: to improve flexibility, strength and mobility.  Verbal and tactile cues throughout for technique. Treadmill - 0.6 mph at 2.5  incline x 6 min Seated marching x 10 bil Seated hip ER AROM 2x5 each leg Step ups 8' 2x5 each leg Standing marches 2# 2x5 bil Standing hip flexion 2# 2x5 bil Seated row 20# 2x10 Seated lat pull 20# 2x10 Standing posterior pelvic rotation with foot on low mat table x 10 L side  03/03/22 THERAPEUTIC EXERCISE: to improve flexibility, strength and mobility.  Verbal and tactile cues throughout for technique. Treadmill - 0.6 mph at 2.5 incline x 6 min Standing lumbar ext 5 sec hold x 5 POE x 1 min (pain is almost gone in left thigh) PPU x 5 cues to keep neck in alignment The following exercises were done 5 reps ea in order, then repeated for 2nd set: Bridge + GTB hip ABD isometric 5 x 3", 2 sets - limited lift on 1st set, full lift on 2nd set - no increased pain when reps limited to sets of 5 TrA+ hooklying GTB clam 5 x 3" TrA + hooklying GTB alt bent-knee fallout 5 x 3", 2 sets TrA + hooklying GTB alt bent-knee march 5 x 3", 2 sets  R side-lying L GTB clam 3 x 3", 1 sets pt reports pain in L lateral thigh; then did 6 reps no band until pain came SDLY hip ABD x 5 3" hold Prone lying over pillow hip ext 3" hold x 5 ea (left side more difficult) Prone mule kick R x 5 reps; unable to lift L  Left hip flexor stretch off EOB x 30 sec, with strap x 30 sec  02/27/22 THERAPEUTIC EXERCISE: to improve flexibility, strength and mobility.  Verbal and tactile cues throughout for technique. Treadmill - 0.9 mph x 6 min Seated 3-way lumbar flexion stretch with hands supported on green Pball 2 x 30" each position - pt noting L sciatic-type pain triggered with R lateral stretch Foam rolling to B glutes and piriformis Bridge + GTB hip ABD isometric 5 x 3", 2 sets - limited lift on 1st set, full lift on 2nd set - no increased pain when reps limited to sets of 5 TrA+ hooklying GTB clam 5 x 3" TrA + hooklying GTB alt bent-knee fallout 5 x 3", 2 sets R side-lying L GTB clam 5 x 3", 2 sets TrA + hooklying GTB  alt bent-knee march 5 x 3", 2 sets  SELF CARE: Provided instruction in self-STM techniques to L glutes and piriformis using foam roller.   02/24/22 THERAPEUTIC EXERCISE: to improve flexibility, strength and mobility.  Verbal and tactile cues throughout for technique. Treadmill - 0.9 mph x 6 min  MANUAL THERAPY: To promote normalized muscle tension, improved flexibility, and reduced pain. Skilled palpation and monitoring of soft tissue during DN Trigger Point Dry-Needling  Treatment instructions: Expect mild to moderate muscle soreness.  Patient verbalized understanding of these instructions and education. Patient Consent Given: Yes Education handout provided: Previously provided Muscles treated: L glute medius & minimus Electrical stimulation performed: No Parameters: N/A Treatment response/outcome: Twitch Response Elicited and Palpable Increase in Muscle Length STM/DTM, manual TPR and pin & stretch to muscles addressed with DN MET to correct apparent L anterior innominate rotation of pelvis on sacrum followed by pelvic shotgun - alignment normalized after 2 rounds of  MET STM/DTM and manual TPR to L hip flexors   MODALITIES: IFC to B low back/SIJ/upper buttocks, 80-150 Hz, intensity to patient tolerance x 15 min Moist heat pack to B low back/SIJ/upper buttocks x 15 min   02/20/22 THERAPEUTIC EXERCISE: to improve flexibility, strength and mobility.  Verbal and tactile cues throughout for technique. Rec Bike - L3 x 6 min Seated 3-way lumbar flexion stretch with hands supported on green Pball 2 x 30" each position Seated R/L QL stretch 2 x 30" Bridge + GTB hip ABD isometric 5 x 5" - discontinued d/t increased pain in L upper hip/buttock TrA + GTB alt bent-knee fallout 7 x 3" - - discontinued d/t increased pain in L upper hip/buttock  MANUAL THERAPY: To promote normalized muscle tension, improved flexibility, and reduced pain. STM & manual TPR to L medial glutes  SELF  CARE: Provided instruction in self-STM techniques to glutes/piriformis using tennis ball on wall.  MODALITIES: Mechanical intermittent lumbar traction - neutral pull in hooklying - 60#/40# - 60 sec on/20 sec rest x 12 min (pt weight ~140#)   PATIENT EDUCATION:  Education details: HEP progression Person educated: Patient Education method: Explanation and Handouts Education comprehension: verbalized understanding and returned demonstration   HOME EXERCISE PROGRAM:  Access Code: [email or web address] URL: [email or web address] Date: 03/07/2022 Prepared by: Clarene Essex  Exercises - Seated Flexion Stretch with Swiss Ball  - 2-3 x daily - 7 x weekly - 3 reps - 30 sec hold - Seated Thoracic Flexion and Rotation with Swiss Ball  - 2-3 x daily - 7 x weekly - 3 reps - 30 sec hold - Supine Figure 4 Piriformis Stretch  - 2 x daily - 7 x weekly - 2 sets - 2 reps - 30 sec hold - Supine Lower Trunk Rotation  - 2-3 x daily - 7 x weekly - 5 reps - 10 sec hold - Supine Bridge with Mini Swiss Ball Between Knees  - 1 x daily - 3 x weekly - 3 sets - 5 reps - 5 sec hold - Seated Diagonal Chop with Medicine Ball  - 1 x daily - 3 x weekly - 2 sets - 10 reps - Seated Shoulder Row with Anchored Resistance  - 1 x daily - 3 x weekly - 2 sets - 10 reps - 3-5 hold hold - Seated Shoulder Extension and Scapular Retraction with Resistance  - 1 x daily - 3 x weekly - 2 sets - 10 reps - 3-5 sec hold - Static Prone on Elbows  - 2 x daily - 7 x weekly - 2 sets - 10 reps - 3 sec hold - Prone Press Up  - 2 x daily - 7 x weekly - 2 sets - 10 reps - 2 sec hold - Standing Lumbar Extension with Counter  - 2 x daily - 7 x weekly - 2 sets - 10 reps - 3 sec hold - Standing Anti-Rotation Press with Anchored Resistance  - 1 x daily - 7 x weekly - 3 sets - 10 reps - Prone Hip Extension with Pillow Under Abdomen  - 1 x daily - 7 x weekly - 1-3 sets - 10 reps - Seated Quadratus Lumborum Stretch in Chair  - 2-3 x daily - 7 x  weekly - 3 reps - 30 sec hold - Supine Bridge with Resistance Band  - 1 x daily - 3 x weekly - 3 sets - 5 reps - 5 sec hold - Clam with Resistance  - 1  x daily - 3 x weekly - 3 sets - 5 reps - 3-5 sec hold - Piriformis Mobilization on Foam Roll  - 1 x daily - 7 x weekly - 1-2 reps - 1-2 min hold - Gluteus Mobilization with Foam Roll  - 1 x daily - 7 x weekly - 1-2 reps - 1-2 min hold - Supine Quadriceps Stretch with Strap on Table  - 2 x daily - 7 x weekly - 1 sets - 3 reps - 30-60 sec hold  Patient Education - Posture and Body Mechanics - TENS Therapy     ASSESSMENT:  CLINICAL IMPRESSION: Less symptoms in shoulder and neck with noodle submersion (with TrA set); no pain with side stepping today.  Exercise tolerance in water gradually improving.  She requires minimal cueing for execution of exercises.  Good sitting balance attained on noodle. She reports reduction in pain through session to 0-1/10.  Tolerates progression of therapy well.Progressing towards goals.   OBJECTIVE IMPAIRMENTS: decreased activity tolerance, decreased knowledge of condition, decreased mobility, difficulty walking, decreased strength, hypomobility, increased fascial restrictions, impaired perceived functional ability, increased muscle spasms, impaired flexibility, impaired sensation, improper body mechanics, postural dysfunction, and pain.   ACTIVITY LIMITATIONS: carrying, lifting, bending, sitting, standing, squatting, stairs, and locomotion level  PARTICIPATION LIMITATIONS: meal prep, cleaning, laundry, driving, and occupation  PERSONAL FACTORS: Past/current experiences, Profession, Time since onset of injury/illness/exacerbation, and 3+ comorbidities: GERD, migraines, cervix surgery, hernia repair, orthostatic hypotension, chronic neck and back pain  are also affecting patient's functional outcome.   REHAB POTENTIAL: Good  CLINICAL DECISION MAKING: Stable/uncomplicated  EVALUATION COMPLEXITY:  Low   GOALS: Goals reviewed with patient? Yes  SHORT TERM GOALS: Target date: 12/30/2021   Patient will be independent with initial HEP to improve outcomes and carryover.  Baseline:  Goal status: MET  12/20/21  2.  Patient will report centralization of radicular symptoms.  Baseline:  Goal status: MET  12/23/21 - Pain not typically extending beyond hip at this point  [place]: Target date: 11/202023    Patient will be independent with ongoing/advanced HEP for self-management at home.  Baseline:  Goal status: IN PROGRESS 02/10/22  2.  Patient will report 50-75% improvement in low back and L LE radicular pain to improve QOL.  Baseline:  Goal status: REVISED  01/09/22 - 90% improvement per patient, increased pain since 02/03/22  3.  Patient to demonstrate ability to achieve and maintain good spinal alignment/posturing and body mechanics needed for daily activities. Baseline:  Goal status: IN PROGRESS  02/10/22 - feels some relief with neutral spine  4.  Patient will demonstrate full pain free lumbar ROM to perform ADLs.   Baseline:  Goal status: MET 02/10/22  5.  Patient will demonstrate improved B LE strength to >/= 4+/5 for improved stability and ease of mobility . Baseline:  Goal status: IN PROGRESS 02/10/22  6.  Patient will report 50 on lumbar FOTO to demonstrate improved functional ability.  Baseline: 40 eval; 44 02/10/22 Goal status: IN PROGRESS   7.  Patient will tolerate 30+ min of walking to allow her to resume walking for exercise. Baseline:  Goal status: IN PROGRESS  03/07/22- was able to walk 25 min last week   PLAN: PT FREQUENCY: 2x/week  PT DURATION: 6 weeks  PLANNED INTERVENTIONS: Therapeutic exercises, Therapeutic activity, Neuromuscular re-education, Balance training, Gait training, Patient/Family education, Self Care, Joint mobilization, Aquatic Therapy, Dry Needling, Electrical stimulation, Spinal mobilization, Cryotherapy, Moist heat, Taping, Traction,  Ultrasound, Manual therapy, and  Re-evaluation.  PLAN FOR NEXT SESSION:   reassess SIJ alignment PRN; focus on abdominals, lumbar strength, maintaining neutral spine with ADLS  Kerin Perna, PTA 03/17/22 8:11 AM [facility] [place], [place], [address] Phone: [phone]   Fax:  [phone]

## 2022-03-20 ENCOUNTER — Ambulatory Visit (HOSPITAL_BASED_OUTPATIENT_CLINIC_OR_DEPARTMENT_OTHER): Payer: Medicaid Other | Admitting: Physical Therapy

## 2022-03-20 ENCOUNTER — Encounter (HOSPITAL_BASED_OUTPATIENT_CLINIC_OR_DEPARTMENT_OTHER): Payer: Self-pay | Admitting: Physical Therapy

## 2022-03-20 DIAGNOSIS — M5459 Other low back pain: Secondary | ICD-10-CM | POA: Diagnosis not present

## 2022-03-20 DIAGNOSIS — M6281 Muscle weakness (generalized): Secondary | ICD-10-CM

## 2022-03-20 DIAGNOSIS — R262 Difficulty in walking, not elsewhere classified: Secondary | ICD-10-CM

## 2022-03-20 NOTE — Therapy (Signed)
OUTPATIENT PHYSICAL THERAPY TREATMENT   Patient Name: Carla Hines MRN: [id number] DOB:07-Nov-1965, 56 y.o., female Today's Date: 03/20/2022   PT End of Session - 03/20/22 0953     Visit Number 21    Number of Visits 23    Date for PT Re-Evaluation 03/24/22    Authorization Type [employer]    Authorization Time Period 12/23/21 - 06/21/22    Authorization - Number of Visits 97    PT Start Time 0949    PT Stop Time 1030    PT Time Calculation (min) 41 min    Activity Tolerance Patient tolerated treatment well    Behavior During Therapy [facility] for tasks assessed/performed                     Past Medical History:  Diagnosis Date   Acid reflux    Migraines    Past Surgical History:  Procedure Laterality Date   CERVIX SURGERY     HERNIA REPAIR     There are no problems to display for this patient.   PCP: Leota Jacobsen, MD  REFERRING PROVIDER: Lowella Petties, MD  REFERRING DIAG: M54.50 (ICD-10-CM) - Low back pain  RATIONALE FOR EVALUATION AND TREATMENT: Rehabilitation  THERAPY DIAG:  Other low back pain  Muscle weakness (generalized)  Difficulty in walking, not elsewhere classified  ONSET DATE: 01/2020 (work-related injury)  NEXT MD VISIT: 2 months (just saw him week of 02/03/22)   SUBJECTIVE:                                                                                                                                                                                           SUBJECTIVE STATEMENT: Patient reports low pain this am although did spike yesterday with a lot of activity to 3/10, Laid down x 3 hours and it resolved  PAIN:  Are you having pain? Yes: NPRS scale: 1/10 Pain location: bilat low back Pain description: sharp Alleviating factors: laying down Aggravating factors: walking >20 min  PERTINENT HISTORY: GERD, migraines, cervix surgery, hernia repair, orthostatic hypotension, chronic neck and back pain  PRECAUTIONS:  None  WEIGHT BEARING RESTRICTIONS: No  FALLS:  Has patient fallen in last 6 months? No  LIVING ENVIRONMENT: Lives with: lives with their family and lives with their spouse Lives in: House/apartment Stairs: Yes: External: 1 steps; none (basement present but unusable) Has following equipment at home:  walking stick  OCCUPATION: Applying for disability  PLOF: Independent and Leisure: camping 1x/mo, church, walking qod (30-60 min)     PATIENT GOALS: "To be able to get back to walking 30-60 min and  be able to get back to being able to learn how to swim."   OBJECTIVE:   DIAGNOSTIC FINDINGS:  05/02/21 - Lumbar Spine CT: IMPRESSION:  1. No acute osseous abnormality.  2. Mild degenerative changes at L4-L5    PATIENT SURVEYS:  FOTO Lumbar = 40; predicted = 50 FOTO lumbar = 44 02/10/22  SCREENING FOR RED FLAGS: Bowel or bladder incontinence: No Spinal tumors: No Cauda equina syndrome: No Compression fracture: No Abdominal aneurysm: No  COGNITION:  Overall cognitive status: Within functional limits for tasks assessed     SENSATION: WFL Intermittent pain, numbness and tingling down L LE  MUSCLE LENGTH: Hamstrings: mild tight B ITB: mild tight B Piriformis: mild tight B Hip flexors: mild tight B Quads: mild tight B  POSTURE:  rounded shoulders, forward head, and increased lumbar lordosis  PALPATION: Increased muscle tension and TTP in B lumbar paraspinals and L>R glutes > piriformis.   Lumbar spine hypomobile with CPAs.  LUMBAR ROM:   Active  AROM  eval 12/30/21 02/10/22  Flexion Hands to ankles - tight Hands to ankles - mild pain  WNL  Extension WFL but pain at end ROM [facility]- mild pain WNL  Right lateral flexion [facility]- mild pain WNL  Left lateral flexion [facility] WFL- mild pain WNL  Right rotation [facility] WNL  Left rotation [facility] WFL WNL   (Blank rows = not tested)  LOWER EXTREMITY ROM:    [facility]  LOWER EXTREMITY MMT:    MMT Right eval Left eval Right 01/07/22  Left 01/07/22 Right 02/10/22 Left 02/10/22  Hip flexion 4 4- 4+ 4 4+ 4+  Hip extension 3+ 3+   4+* 4+*  Hip abduction 4 4- 4+ 4 5 4+  Hip adduction 3+ 3+ 4- 4- 5 5  Hip internal rotation 4+ 4+   5 5  Hip external rotation 4- 4- 4+ 4+ 4+ 4+  Knee flexion 4+ 4 4+ 4 4+ 4  Knee extension 4+ 4+   5 5  Ankle dorsiflexion 4+ _0 Ankle plantarflexion 4- 7 SLS HR 4- 8 SLS HR      Ankle inversion        Ankle eversion         (Blank rows = not tested)  LUMBAR SPECIAL TESTS:  Straight leg raise test: Positive bilaterally   TODAY'S TREATMENT: 03/20/22: Pt seen for aquatic therapy today.  Treatment took place in water 3.25-4.8 ft in depth at the [facility] pool. Temp of water was 92.  Pt entered/exited the pool via stairs (step through pattern) independently with bilat rail.   Holding onto white barbell:  Walking forward/ backward 5 laps; high knee marching; side stepping (cues to shorten step length) Holding wall:  heel raises x 15; squats x10- cues to engage TrA; hip abdct/ addct x 15;  Single leg clam x 10;  Holding onto white barbell:  marching backwards/ forwards;  forward walking kicks;   Kick board row LE staggered R/L then together. Cues for tightened core. X 20 each TrA set with short blue noodle pull down to LEs x 10 then with sm blue squoodle x 6; trunk rotation holding short noodle x 5 x 2 Tandem stance leading R/L hold x>20s ea SLS left hold x 20s, R more difficulty after several tries gains position for 15s Cycling on noodle 3 widths using  yellow hand buoys for support. Ue support on corner of wall: add/abd    Pt requires buoyancy  for support and to offload joints with strengthening exercises. Viscosity of the water is needed for resistance of strengthening; water current perturbations provides challenge to standing balance unsupported, requiring increased core activation.   03/17/22: Pt seen for aquatic therapy today.  Treatment took place in water 3.25-4.8 ft  in depth at the [facility] pool. Temp of water was 92.  Pt entered/exited the pool via stairs (step through pattern) independently with bilat rail.   Holding onto white barbell:  Walking forward/ backward 5 laps; high knee marching; side stepping (cues to shorten step length) Holding wall:  heel raises x 15; squats x10- cues to engage TrA; hip abdct/ addct x 15;  Single leg clam x 10;  Holding onto white barbell:  marching backwards/ forwards;  forward walking kicks;   Kick board row LE staggered R/L then together. Cues for tightened core. X 10 each TrA set with short blue noodle pull down to LEs x 10 x 2; trunk rotation holding short noodle x 5 x 2 Cycling on noodle 3 widths using  yellow hand buoys for support    Pt requires buoyancy for support and to offload joints with strengthening exercises. Viscosity of the water is needed for resistance of strengthening; water current perturbations provides challenge to standing balance unsupported, requiring increased core activation.  03/07/22 THERAPEUTIC EXERCISE: to improve flexibility, strength and mobility.  Verbal and tactile cues throughout for technique. Treadmill - 0.6 mph at 2.5 incline x 6 min Seated marching x 10 bil Seated hip ER AROM 2x5 each leg Step ups 8' 2x5 each leg Standing marches 2# 2x5 bil Standing hip flexion 2# 2x5 bil Seated row 20# 2x10 Seated lat pull 20# 2x10 Standing posterior pelvic rotation with foot on low mat table x 10 L side  03/03/22 THERAPEUTIC EXERCISE: to improve flexibility, strength and mobility.  Verbal and tactile cues throughout for technique. Treadmill - 0.6 mph at 2.5 incline x 6 min Standing lumbar ext 5 sec hold x 5 POE x 1 min (pain is almost gone in left thigh) PPU x 5 cues to keep neck in alignment The following exercises were done 5 reps ea in order, then repeated for 2nd set: Bridge + GTB hip ABD isometric 5 x 3", 2 sets - limited lift on 1st set, full lift on 2nd set - no  increased pain when reps limited to sets of 5 TrA+ hooklying GTB clam 5 x 3" TrA + hooklying GTB alt bent-knee fallout 5 x 3", 2 sets TrA + hooklying GTB alt bent-knee march 5 x 3", 2 sets  R side-lying L GTB clam 3 x 3", 1 sets pt reports pain in L lateral thigh; then did 6 reps no band until pain came SDLY hip ABD x 5 3" hold Prone lying over pillow hip ext 3" hold x 5 ea (left side more difficult) Prone mule kick R x 5 reps; unable to lift L  Left hip flexor stretch off EOB x 30 sec, with strap x 30 sec  02/27/22 THERAPEUTIC EXERCISE: to improve flexibility, strength and mobility.  Verbal and tactile cues throughout for technique. Treadmill - 0.9 mph x 6 min Seated 3-way lumbar flexion stretch with hands supported on green Pball 2 x 30" each position - pt noting L sciatic-type pain triggered with R lateral stretch Foam rolling to B glutes and piriformis Bridge + GTB hip ABD isometric 5 x 3", 2 sets - limited lift on 1st set, full lift on 2nd set - no increased  pain when reps limited to sets of 5 TrA+ hooklying GTB clam 5 x 3" TrA + hooklying GTB alt bent-knee fallout 5 x 3", 2 sets R side-lying L GTB clam 5 x 3", 2 sets TrA + hooklying GTB alt bent-knee march 5 x 3", 2 sets  SELF CARE: Provided instruction in self-STM techniques to L glutes and piriformis using foam roller.   02/24/22 THERAPEUTIC EXERCISE: to improve flexibility, strength and mobility.  Verbal and tactile cues throughout for technique. Treadmill - 0.9 mph x 6 min  MANUAL THERAPY: To promote normalized muscle tension, improved flexibility, and reduced pain. Skilled palpation and monitoring of soft tissue during DN Trigger Point Dry-Needling  Treatment instructions: Expect mild to moderate muscle soreness.  Patient verbalized understanding of these instructions and education. Patient Consent Given: Yes Education handout provided: Previously provided Muscles treated: L glute medius & minimus Electrical  stimulation performed: No Parameters: N/A Treatment response/outcome: Twitch Response Elicited and Palpable Increase in Muscle Length STM/DTM, manual TPR and pin & stretch to muscles addressed with DN MET to correct apparent L anterior innominate rotation of pelvis on sacrum followed by pelvic shotgun - alignment normalized after 2 rounds of MET STM/DTM and manual TPR to L hip flexors   MODALITIES: IFC to B low back/SIJ/upper buttocks, 80-150 Hz, intensity to patient tolerance x 15 min Moist heat pack to B low back/SIJ/upper buttocks x 15 min   02/20/22 THERAPEUTIC EXERCISE: to improve flexibility, strength and mobility.  Verbal and tactile cues throughout for technique. Rec Bike - L3 x 6 min Seated 3-way lumbar flexion stretch with hands supported on green Pball 2 x 30" each position Seated R/L QL stretch 2 x 30" Bridge + GTB hip ABD isometric 5 x 5" - discontinued d/t increased pain in L upper hip/buttock TrA + GTB alt bent-knee fallout 7 x 3" - - discontinued d/t increased pain in L upper hip/buttock  MANUAL THERAPY: To promote normalized muscle tension, improved flexibility, and reduced pain. STM & manual TPR to L medial glutes  SELF CARE: Provided instruction in self-STM techniques to glutes/piriformis using tennis ball on wall.  MODALITIES: Mechanical intermittent lumbar traction - neutral pull in hooklying - 60#/40# - 60 sec on/20 sec rest x 12 min (pt weight ~140#)   PATIENT EDUCATION:  Education details: HEP progression Person educated: Patient Education method: Explanation and Handouts Education comprehension: verbalized understanding and returned demonstration   HOME EXERCISE PROGRAM:  Access Code: [email or web address] URL: [email or web address] Date: 03/07/2022 Prepared by: Clarene Essex  Exercises - Seated Flexion Stretch with Swiss Ball  - 2-3 x daily - 7 x weekly - 3 reps - 30 sec hold - Seated Thoracic Flexion and Rotation with Swiss Ball  - 2-3 x daily  - 7 x weekly - 3 reps - 30 sec hold - Supine Figure 4 Piriformis Stretch  - 2 x daily - 7 x weekly - 2 sets - 2 reps - 30 sec hold - Supine Lower Trunk Rotation  - 2-3 x daily - 7 x weekly - 5 reps - 10 sec hold - Supine Bridge with Mini Swiss Ball Between Knees  - 1 x daily - 3 x weekly - 3 sets - 5 reps - 5 sec hold - Seated Diagonal Chop with Medicine Ball  - 1 x daily - 3 x weekly - 2 sets - 10 reps - Seated Shoulder Row with Anchored Resistance  - 1 x daily - 3 x weekly - 2 sets - 10 reps - 3-5  hold hold - Seated Shoulder Extension and Scapular Retraction with Resistance  - 1 x daily - 3 x weekly - 2 sets - 10 reps - 3-5 sec hold - Static Prone on Elbows  - 2 x daily - 7 x weekly - 2 sets - 10 reps - 3 sec hold - Prone Press Up  - 2 x daily - 7 x weekly - 2 sets - 10 reps - 2 sec hold - Standing Lumbar Extension with Counter  - 2 x daily - 7 x weekly - 2 sets - 10 reps - 3 sec hold - Standing Anti-Rotation Press with Anchored Resistance  - 1 x daily - 7 x weekly - 3 sets - 10 reps - Prone Hip Extension with Pillow Under Abdomen  - 1 x daily - 7 x weekly - 1-3 sets - 10 reps - Seated Quadratus Lumborum Stretch in Chair  - 2-3 x daily - 7 x weekly - 3 reps - 30 sec hold - Supine Bridge with Resistance Band  - 1 x daily - 3 x weekly - 3 sets - 5 reps - 5 sec hold - Clam with Resistance  - 1 x daily - 3 x weekly - 3 sets - 5 reps - 3-5 sec hold - Piriformis Mobilization on Foam Roll  - 1 x daily - 7 x weekly - 1-2 reps - 1-2 min hold - Gluteus Mobilization with Foam Roll  - 1 x daily - 7 x weekly - 1-2 reps - 1-2 min hold - Supine Quadriceps Stretch with Strap on Table  - 2 x daily - 7 x weekly - 1 sets - 3 reps - 30-60 sec hold  Patient Education - Posture and Body Mechanics - TENS Therapy     ASSESSMENT:  CLINICAL IMPRESSION: Pt reporting no knee pain since beginning aquatics.  C spine and shoulders without complaints. Progressed core strengthening which she tolerates well.  She  requires short rest periods throughout. Continued VC for TrA engagement . Added some balance activities which are a good challenge for her. Pt putting forth excellent effort. Goals ongoing    OBJECTIVE IMPAIRMENTS: decreased activity tolerance, decreased knowledge of condition, decreased mobility, difficulty walking, decreased strength, hypomobility, increased fascial restrictions, impaired perceived functional ability, increased muscle spasms, impaired flexibility, impaired sensation, improper body mechanics, postural dysfunction, and pain.   ACTIVITY LIMITATIONS: carrying, lifting, bending, sitting, standing, squatting, stairs, and locomotion level  PARTICIPATION LIMITATIONS: meal prep, cleaning, laundry, driving, and occupation  PERSONAL FACTORS: Past/current experiences, Profession, Time since onset of injury/illness/exacerbation, and 3+ comorbidities: GERD, migraines, cervix surgery, hernia repair, orthostatic hypotension, chronic neck and back pain  are also affecting patient's functional outcome.   REHAB POTENTIAL: Good  CLINICAL DECISION MAKING: Stable/uncomplicated  EVALUATION COMPLEXITY: Low   GOALS: Goals reviewed with patient? Yes  SHORT TERM GOALS: Target date: 12/30/2021   Patient will be independent with initial HEP to improve outcomes and carryover.  Baseline:  Goal status: MET  12/20/21  2.  Patient will report centralization of radicular symptoms.  Baseline:  Goal status: MET  12/23/21 - Pain not typically extending beyond hip at this point  [place]: Target date: 11/202023    Patient will be independent with ongoing/advanced HEP for self-management at home.  Baseline:  Goal status: IN PROGRESS 02/10/22  2.  Patient will report 50-75% improvement in low back and L LE radicular pain to improve QOL.  Baseline:  Goal status: REVISED  01/09/22 - 90% improvement per patient,  increased pain since 02/03/22  3.  Patient to demonstrate ability to achieve and  maintain good spinal alignment/posturing and body mechanics needed for daily activities. Baseline:  Goal status: IN PROGRESS  02/10/22 - feels some relief with neutral spine  4.  Patient will demonstrate full pain free lumbar ROM to perform ADLs.   Baseline:  Goal status: MET 02/10/22  5.  Patient will demonstrate improved B LE strength to >/= 4+/5 for improved stability and ease of mobility . Baseline:  Goal status: IN PROGRESS 02/10/22  6.  Patient will report 50 on lumbar FOTO to demonstrate improved functional ability.  Baseline: 40 eval; 44 02/10/22 Goal status: IN PROGRESS   7.  Patient will tolerate 30+ min of walking to allow her to resume walking for exercise. Baseline:  Goal status: IN PROGRESS  03/07/22- was able to walk 25 min last week   PLAN: PT FREQUENCY: 2x/week  PT DURATION: 6 weeks  PLANNED INTERVENTIONS: Therapeutic exercises, Therapeutic activity, Neuromuscular re-education, Balance training, Gait training, Patient/Family education, Self Care, Joint mobilization, Aquatic Therapy, Dry Needling, Electrical stimulation, Spinal mobilization, Cryotherapy, Moist heat, Taping, Traction, Ultrasound, Manual therapy, and Re-evaluation.  PLAN FOR NEXT SESSION:   reassess SIJ alignment PRN; focus on abdominals, lumbar strength, maintaining neutral spine with ADLS  Stanton Kidney Tharon Aquas) Decklyn Hyder MPT 03/20/22 9:55 AM [place], [place], [address] Phone: [phone]   Fax:  [phone]

## 2022-03-24 ENCOUNTER — Ambulatory Visit: Payer: Medicaid Other | Admitting: Physical Therapy

## 2022-03-24 ENCOUNTER — Encounter: Payer: Self-pay | Admitting: Physical Therapy

## 2022-03-24 ENCOUNTER — Ambulatory Visit (HOSPITAL_BASED_OUTPATIENT_CLINIC_OR_DEPARTMENT_OTHER): Payer: Medicaid Other | Admitting: Physical Therapy

## 2022-03-24 DIAGNOSIS — M62830 Muscle spasm of back: Secondary | ICD-10-CM

## 2022-03-24 DIAGNOSIS — M5459 Other low back pain: Secondary | ICD-10-CM

## 2022-03-24 DIAGNOSIS — R262 Difficulty in walking, not elsewhere classified: Secondary | ICD-10-CM

## 2022-03-24 DIAGNOSIS — G8929 Other chronic pain: Secondary | ICD-10-CM

## 2022-03-24 DIAGNOSIS — M6281 Muscle weakness (generalized): Secondary | ICD-10-CM

## 2022-03-24 DIAGNOSIS — M62838 Other muscle spasm: Secondary | ICD-10-CM

## 2022-03-24 DIAGNOSIS — M542 Cervicalgia: Secondary | ICD-10-CM

## 2022-03-24 NOTE — Therapy (Addendum)
OUTPATIENT PHYSICAL THERAPY TREATMENT / PROGRESS NOTE / DISCHARGE SUMMARY   Patient Name: Carla Hines MRN: [id number] DOB:11/09/1965, 56 y.o., female Today's Date: 03/24/2022  Progress Note  Reporting Period 02/10/2022 to 03/24/2022  See note below for Objective Data and Assessment of Progress/Goals.       PT End of Session - 03/24/22 0846     Visit Number 22    Number of Visits 23    Date for PT Re-Evaluation 03/24/22    Authorization Type [employer]    Authorization Time Period 12/23/21 - 06/21/22    Authorization - Number of Visits 50    PT Start Time 0846    PT Stop Time 0926    PT Time Calculation (min) 40 min    Activity Tolerance Patient tolerated treatment well    Behavior During Therapy WFL for tasks assessed/performed                     Past Medical History:  Diagnosis Date   Acid reflux    Migraines    Past Surgical History:  Procedure Laterality Date   CERVIX SURGERY     HERNIA REPAIR     There are no problems to display for this patient.   PCP: Leota Jacobsen, MD  REFERRING PROVIDER: Lowella Petties, MD  REFERRING DIAG: M54.50 (ICD-10-CM) - Low back pain  RATIONALE FOR EVALUATION AND TREATMENT: Rehabilitation  THERAPY DIAG:  Other low back pain  Muscle weakness (generalized)  Difficulty in walking, not elsewhere classified  Cervicalgia  Chronic midline low back pain with bilateral sciatica  Muscle spasm of back  Other muscle spasm  ONSET DATE: 01/2020 (work-related injury)  NEXT MD VISIT: 2 months (just saw him week of 02/03/22)   SUBJECTIVE:                                                                                                                                                                                           SUBJECTIVE STATEMENT: Patient reports the aquatic therapy seems to have helped both her back and her knees.  PAIN:  Are you having pain? Yes: NPRS scale: 1/10 Pain location:  midline low back Pain description: "strong" Aggravating factors: prolonged standing Relieving factors: lay down  PERTINENT HISTORY: GERD, migraines, cervix surgery, hernia repair, orthostatic hypotension, chronic neck and back pain  PRECAUTIONS: None  WEIGHT BEARING RESTRICTIONS: No  FALLS:  Has patient fallen in last 6 months? No  LIVING ENVIRONMENT: Lives with: lives with their family and lives with their spouse Lives in: House/apartment Stairs: Yes: External: 1 steps; none (basement present but unusable) Has following equipment  at home:  walking stick  OCCUPATION: Applying for disability  PLOF: Independent and Leisure: camping 1x/mo, church, walking qod (30-60 min)     PATIENT GOALS: "To be able to get back to walking 30-60 min and be able to get back to being able to learn how to swim."   OBJECTIVE:   DIAGNOSTIC FINDINGS:  05/02/21 - Lumbar Spine CT: IMPRESSION:  1. No acute osseous abnormality.  2. Mild degenerative changes at L4-L5    PATIENT SURVEYS:  FOTO Lumbar = 40; predicted = 50 FOTO lumbar = 44 02/10/22  SCREENING FOR RED FLAGS: Bowel or bladder incontinence: No Spinal tumors: No Cauda equina syndrome: No Compression fracture: No Abdominal aneurysm: No  COGNITION:  Overall cognitive status: Within functional limits for tasks assessed     SENSATION: WFL Intermittent pain, numbness and tingling down L LE  MUSCLE LENGTH: Hamstrings: mild tight B ITB: mild tight B Piriformis: mild tight B Hip flexors: mild tight B Quads: mild tight B  POSTURE:  rounded shoulders, forward head, and increased lumbar lordosis  PALPATION: Increased muscle tension and TTP in B lumbar paraspinals and L>R glutes > piriformis.   Lumbar spine hypomobile with CPAs.  LUMBAR ROM:   Active  AROM  eval 12/30/21 02/10/22 03/24/22  Flexion Hands to ankles - tight Hands to ankles - mild pain  WNL WNL  Extension WFL but pain at end ROM [facility]- mild pain WNL WNL  Right lateral  flexion [facility] WFL- mild pain WNL WNL  Left lateral flexion [facility] WFL- mild pain WNL WNL  Right rotation [facility] WFL WNL WNL  Left rotation [facility] WFL WNL WNL   (Blank rows = not tested)  LOWER EXTREMITY ROM:    [facility]  LOWER EXTREMITY MMT:    MMT Right eval Left eval Right 01/07/22 Left 01/07/22 Right 02/10/22 Left 02/10/22 Right 03/24/22 Left 03/24/22  Hip flexion 4 4- 4+ 4 4+ 4+ 4+ 4+  Hip extension 3+ 3+   4+* 4+* 4+ 4+  Hip abduction 4 4- 4+ 4 5 4+ 5 4+  Hip adduction 3+ 3+ 4- 4- _0 Hip internal rotation 4+ 4+   _1 Hip external rotation 4- 4- 4+ 4+ 4+ 4+ 4+ 4+  Knee flexion 4+ 4 4+ 4 4+ _2 Knee extension 4+ 4+   _3 Ankle dorsiflexion 4+ _4 Ankle plantarflexion 4- 7 SLS HR 4- 8 SLS HR     4+ 14 SLS HR 4+ 14 SLS HR  Ankle inversion          Ankle eversion           (Blank rows = not tested)  LUMBAR SPECIAL TESTS:  Straight leg raise test: Positive bilaterally   TODAY'S TREATMENT:  03/24/22 THERAPEUTIC EXERCISE: to improve flexibility, strength and mobility.  Verbal and tactile cues throughout for technique. Treadmill - 0.6 mph, 2.0 incline x 6 min  THERAPEUTIC ACTIVITIES: ROM MMT FOTO = 47 Goal assessment  SELF CARE: Review of HEP and recommendations for ongoing frequency Options for aquatic-based exercises at [facility] or [facility]   03/20/22: Pt seen for aquatic therapy today.  Treatment took place in water 3.25-4.8 ft in depth at the [facility] pool. Temp of water was 92.  Pt entered/exited the pool via stairs (step through pattern) independently with bilat rail.   Holding onto white barbell:  Walking forward/ backward 5 laps; high  knee marching; side stepping (cues to shorten step length) Holding wall:  heel raises x 15; squats x10- cues to engage TrA; hip abdct/ addct x 15;  Single leg clam x 10;  Holding onto white barbell:  marching backwards/ forwards;  forward walking kicks;   Kick board row LE staggered R/L then together. Cues  for tightened core. X 20 each TrA set with short blue noodle pull down to LEs x 10 then with sm blue squoodle x 6; trunk rotation holding short noodle x 5 x 2 Tandem stance leading R/L hold x>20s ea SLS left hold x 20s, R more difficulty after several tries gains position for 15s Cycling on noodle 3 widths using  yellow hand buoys for support. Ue support on corner of wall: add/abd    Pt requires buoyancy for support and to offload joints with strengthening exercises. Viscosity of the water is needed for resistance of strengthening; water current perturbations provides challenge to standing balance unsupported, requiring increased core activation.   03/17/22: Pt seen for aquatic therapy today.  Treatment took place in water 3.25-4.8 ft in depth at the [facility] pool. Temp of water was 92.  Pt entered/exited the pool via stairs (step through pattern) independently with bilat rail.   Holding onto white barbell:  Walking forward/ backward 5 laps; high knee marching; side stepping (cues to shorten step length) Holding wall:  heel raises x 15; squats x10- cues to engage TrA; hip abdct/ addct x 15;  Single leg clam x 10;  Holding onto white barbell:  marching backwards/ forwards;  forward walking kicks;   Kick board row LE staggered R/L then together. Cues for tightened core. X 10 each TrA set with short blue noodle pull down to LEs x 10 x 2; trunk rotation holding short noodle x 5 x 2 Cycling on noodle 3 widths using  yellow hand buoys for support    Pt requires buoyancy for support and to offload joints with strengthening exercises. Viscosity of the water is needed for resistance of strengthening; water current perturbations provides challenge to standing balance unsupported, requiring increased core activation.   PATIENT EDUCATION:  Education details: HEP progression Person educated: Patient Education method: Theatre stage manager Education comprehension: verbalized understanding  and returned demonstration   HOME EXERCISE PROGRAM:  Access Code: [email or web address] URL: [email or web address] Date: 03/24/2022 Prepared by: Annie Paras  Exercises - Seated Flexion Stretch with Swiss Ball  - 2 x daily - 7 x weekly - 3 reps - 30 sec hold - Seated Thoracic Flexion and Rotation with Swiss Ball  - 2 x daily - 7 x weekly - 3 reps - 30 sec hold - Supine Figure 4 Piriformis Stretch  - 2 x daily - 7 x weekly - 3 reps - 30 sec hold - Supine Quadriceps Stretch with Strap on Table  - 2 x daily - 7 x weekly - 3 reps - 30-60 sec hold - Seated Quadratus Lumborum Stretch in Chair  - 2 x daily - 7 x weekly - 3 reps - 30 sec hold - Supine Lower Trunk Rotation  - 2 x daily - 7 x weekly - 5 reps - 10 sec hold - Static Prone on Elbows  - 2 x daily - 7 x weekly - 2 sets - 10 reps - 3 sec hold - Prone Press Up  - 2 x daily - 7 x weekly - 2 sets - 10 reps - 2 sec hold - Standing Lumbar Extension with Counter  -  2 x daily - 7 x weekly - 2 sets - 10 reps - 3 sec hold - Supine Bridge with Mini Swiss Ball Between Knees  - 1 x daily - 3 x weekly - 3 sets - 5 reps - 5 sec hold - Supine Bridge with Resistance Band  - 1 x daily - 3 x weekly - 3 sets - 5 reps - 5 sec hold - Seated Diagonal Chop with Medicine Ball  - 1 x daily - 3 x weekly - 2 sets - 10 reps - Seated Shoulder Row with Anchored Resistance  - 1 x daily - 3 x weekly - 2 sets - 10 reps - 3-5 hold hold - Seated Shoulder Extension and Scapular Retraction with Resistance  - 1 x daily - 3 x weekly - 2 sets - 10 reps - 3-5 sec hold - Standing Anti-Rotation Press with Anchored Resistance  - 1 x daily - 3 x weekly - 3 sets - 10 reps - Prone Hip Extension with Pillow Under Abdomen  - 1 x daily - 3 x weekly - 1-3 sets - 10 reps - Clam with Resistance  - 1 x daily - 3 x weekly - 3 sets - 5 reps - 3-5 sec hold - Piriformis Mobilization on Foam Roll  - 1 x daily - 7 x weekly - 1-2 reps - 1-2 min hold - Gluteus Mobilization with Foam Roll  - 1 x  daily - 7 x weekly - 1-2 reps - 1-2 min hold  Patient Education - Posture and Body Mechanics - TENS Therapy   ASSESSMENT:  CLINICAL IMPRESSION: Randilyn reports the aquatic PT seemed to help both her back and her knees - knee pain resolved and back pain 80% improved. Her lumbar ROM remains WNL and her overall B LE strength is now 4+ to 5/5. Improvement noted on lumbar FOTO to 47 but remains shy of goal of 50. She has been able to resume walking 2x/wk but only walking for 20 minutes at a time currently. Majority of goals now met with FOTO and walking goals only partially met. HEP was reviewed and options for continued aquatic-based exercises provided. Carla Hines would like to try transitioning to her HEP again, but would like to remain on hold for 30-days in the event that issues arise that may necessitate a return to PT.  OBJECTIVE IMPAIRMENTS: decreased activity tolerance, decreased knowledge of condition, decreased mobility, difficulty walking, decreased strength, hypomobility, increased fascial restrictions, impaired perceived functional ability, increased muscle spasms, impaired flexibility, impaired sensation, improper body mechanics, postural dysfunction, and pain.   ACTIVITY LIMITATIONS: carrying, lifting, bending, sitting, standing, squatting, stairs, and locomotion level  PARTICIPATION LIMITATIONS: meal prep, cleaning, laundry, driving, and occupation  PERSONAL FACTORS: Past/current experiences, Profession, Time since onset of injury/illness/exacerbation, and 3+ comorbidities: GERD, migraines, cervix surgery, hernia repair, orthostatic hypotension, chronic neck and back pain  are also affecting patient's functional outcome.   REHAB POTENTIAL: Good  CLINICAL DECISION MAKING: Stable/uncomplicated  EVALUATION COMPLEXITY: Low   GOALS: Goals reviewed with patient? Yes  SHORT TERM GOALS: Target date: 12/30/2021   Patient will be independent with initial HEP to improve outcomes and carryover.   Baseline:  Goal status: MET  12/20/21  2.  Patient will report centralization of radicular symptoms.  Baseline:  Goal status: MET  12/23/21 - Pain not typically extending beyond hip at this point  [place]: Target date: 11/202023    Patient will be independent with ongoing/advanced HEP for self-management at home.  Baseline:  Goal status: MET 03/24/22  2.  Patient will report 50-75% improvement in low back and L LE radicular pain to improve QOL.  Baseline:  Goal status: MET  03/24/22 - Pt reports 80% improvement  3.  Patient to demonstrate ability to achieve and maintain good spinal alignment/posturing and body mechanics needed for daily activities. Baseline:  Goal status: MET  03/24/22 - Pt feels more comfortable with incorporating good posture and body mechanics into daily activities  4.  Patient will demonstrate full pain free lumbar ROM to perform ADLs.   Baseline:  Goal status: MET 02/10/22  5.  Patient will demonstrate improved B LE strength to >/= 4+/5 for improved stability and ease of mobility . Baseline:  Goal status: MET 03/2022  6.  Patient will report 50 on lumbar FOTO to demonstrate improved functional ability.  Baseline: 40 eval; 44 02/10/22 Goal status: PARTIALLY MET  03/24/22 - FOTO = 47  7.  Patient will tolerate 30+ min of walking to allow her to resume walking for exercise. Baseline:  Goal status: PARTIALLY MET  03/24/22 - Pt currently walking 20 min - 2x/wk   PLAN: PT FREQUENCY: 2x/week  PT DURATION: 6 weeks  PLANNED INTERVENTIONS: Therapeutic exercises, Therapeutic activity, Neuromuscular re-education, Balance training, Gait training, Patient/Family education, Self Care, Joint mobilization, Aquatic Therapy, Dry Needling, Electrical stimulation, Spinal mobilization, Cryotherapy, Moist heat, Taping, Traction, Ultrasound, Manual therapy, and Re-evaluation.  PLAN FOR NEXT SESSION:  transition to HEP + 30-day hold   Percival Spanish, PT,  MPT 03/24/22, 9:40 AM  [facility] [address]  [place], [place], [address] Phone: [phone]   Fax:  [phone]   PHYSICAL THERAPY DISCHARGE SUMMARY  Visits from Start of Care: 22  Current functional level related to goals / functional outcomes:   Refer to above clinical impression and goal assessment for status as of last visit on 03/24/22. Patient was placed on hold for 30 days and has not needed to return to PT, therefore will proceed with discharge from PT for this episode.     Remaining deficits:   As above.    Education / Equipment:   HEP, Biomedical scientist education   Patient agrees to discharge. Patient goals were met or partially met. Patient is being discharged due to being pleased with the current functional level.  Percival Spanish, PT, MPT 05/27/22, 2:10 PM  [facility] [address]  [place], [place], [address] Phone: [phone]   Fax:  [phone]

## 2023-07-12 IMAGING — CT CT ABD-PELV W/ CM
2 of 5 series · 17 of 46 positions shown, 19 images · IV contrast (agent unspecified)
Comparison: June 08, 2015

CLINICAL DATA: Nausea and loose stools.

EXAM:
CT ABDOMEN AND PELVIS WITH CONTRAST
TECHNIQUE: Multidetector CT imaging of the abdomen and pelvis was performed
using the standard protocol following bolus administration of
intravenous contrast.

[Series 2: axial st · axial · 0.92mm/px · z∈[+426,+796]mm · 14 of 86 slices shown, 16 images]
[im 6/86  soft-tissue]
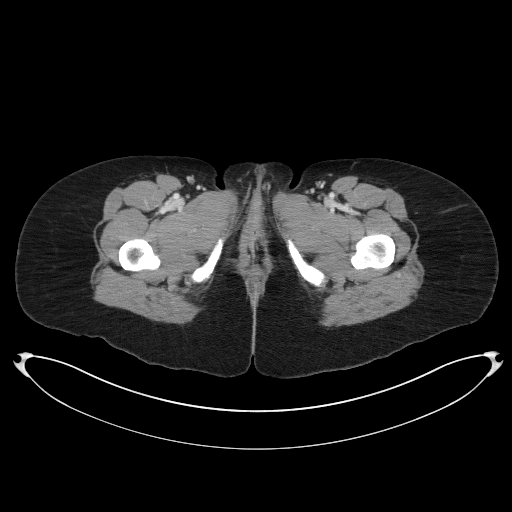
[im 6/86  bone]
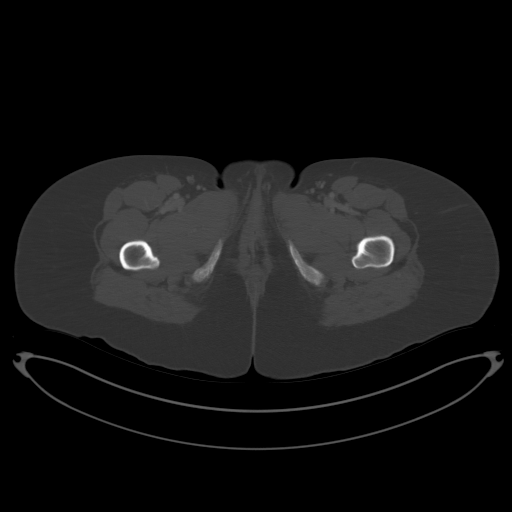
[im 11/86  soft-tissue]
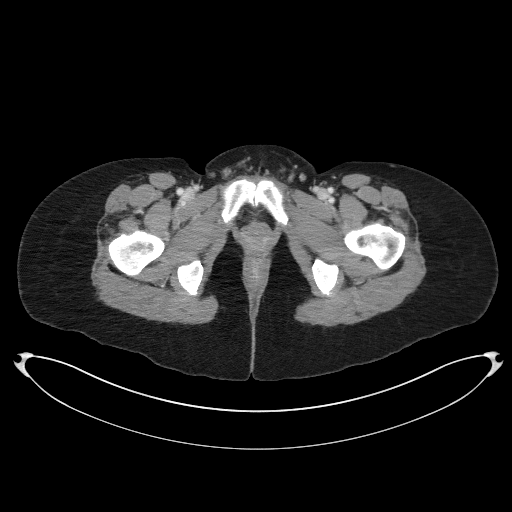
[im 16/86  soft-tissue]
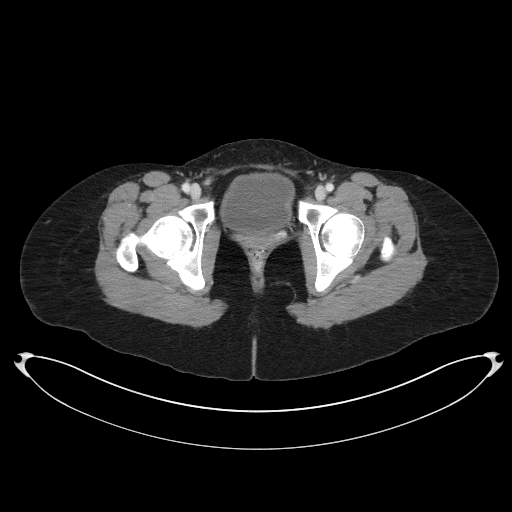
[im 22/86  soft-tissue]
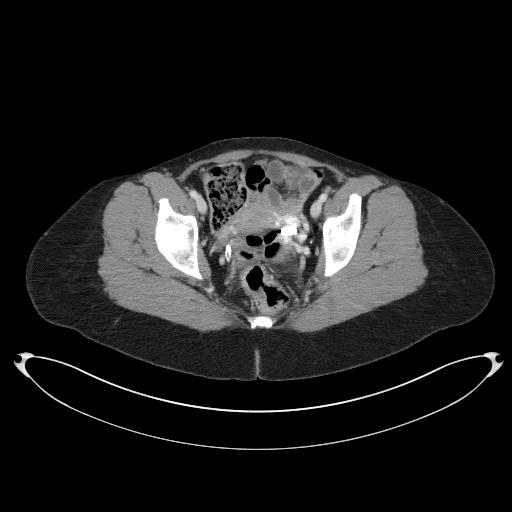
[im 27/86  soft-tissue]
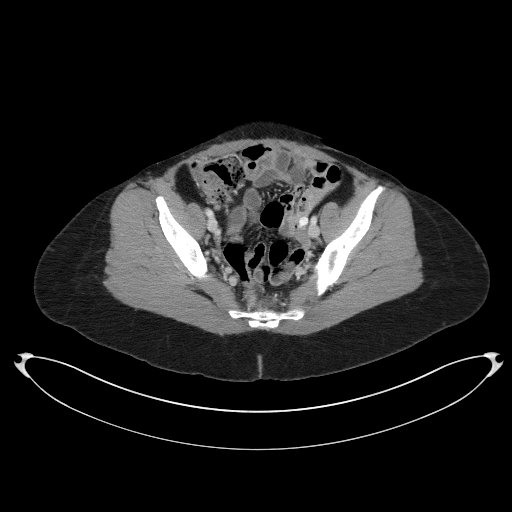
[im 32/86  soft-tissue]
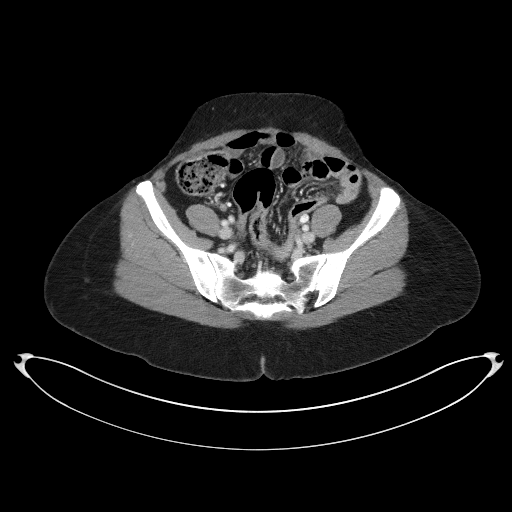
[im 38/86  soft-tissue]
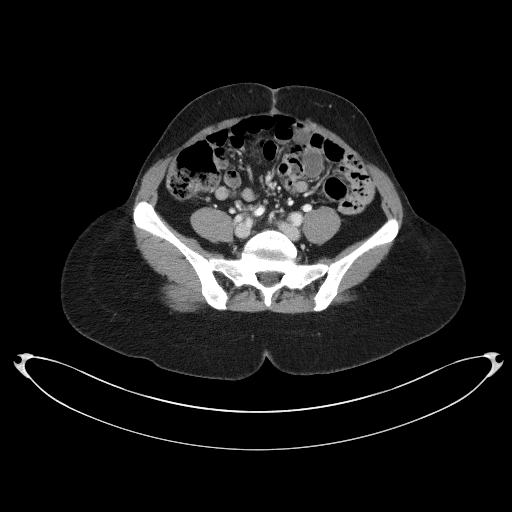
[im 48/86  soft-tissue]
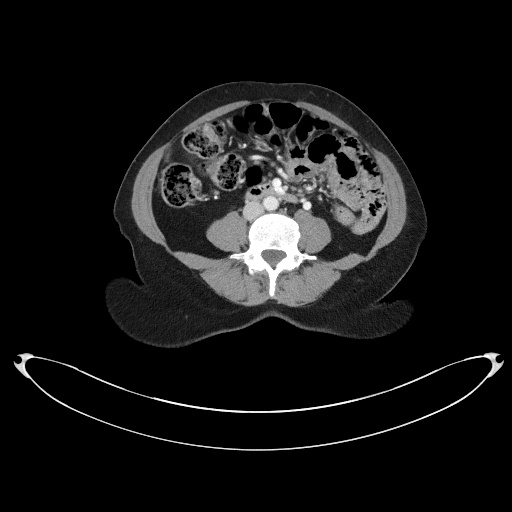
[im 54/86  soft-tissue]
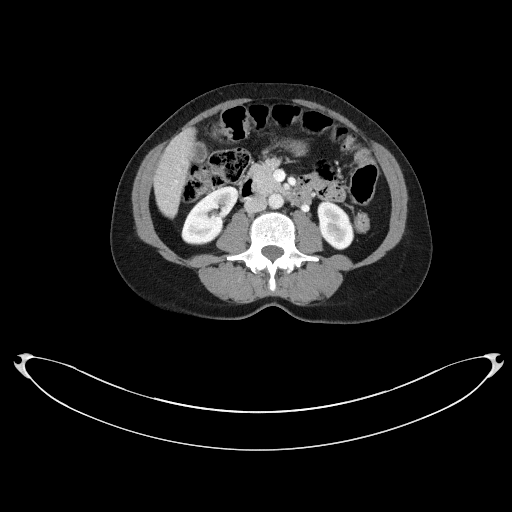
[im 54/86  bone]
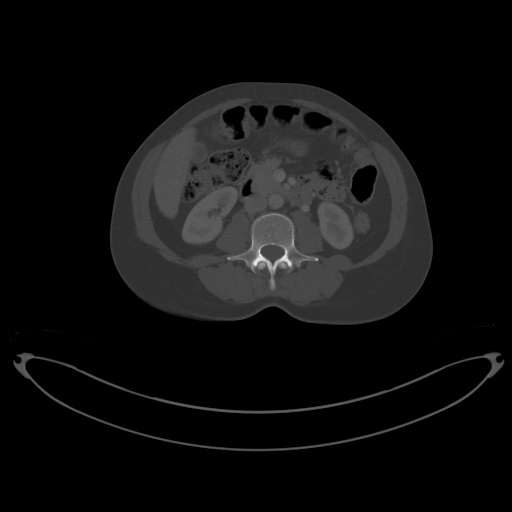
[im 59/86  soft-tissue]
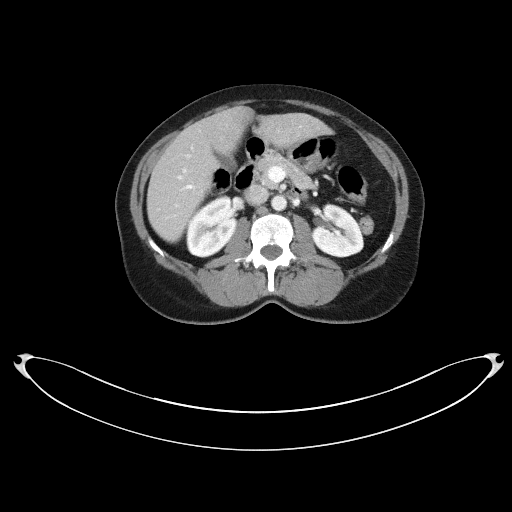
[im 64/86  soft-tissue]
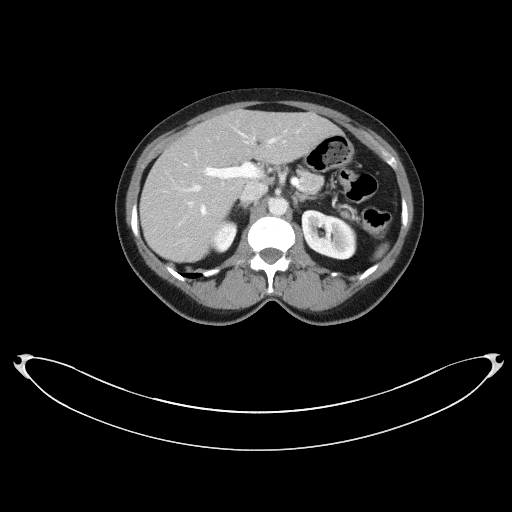
[im 70/86  soft-tissue]
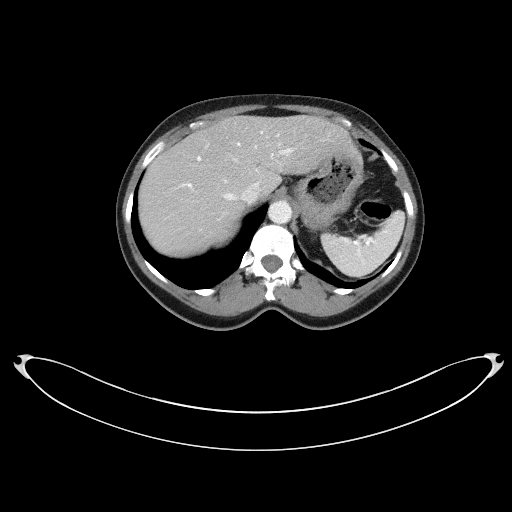
[im 75/86  soft-tissue]
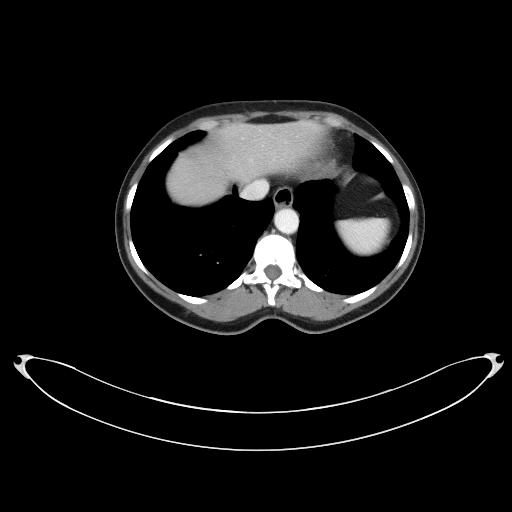
[im 80/86  soft-tissue]
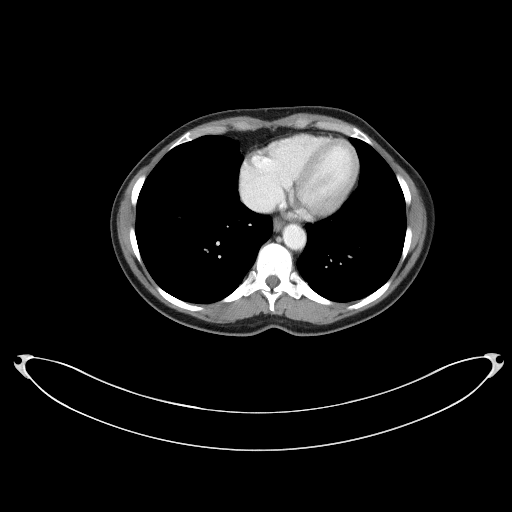

[Series 5: coronal st · coronal · 0.87mm/px · 3 of 106 slices shown]
[im 36/106  soft-tissue]
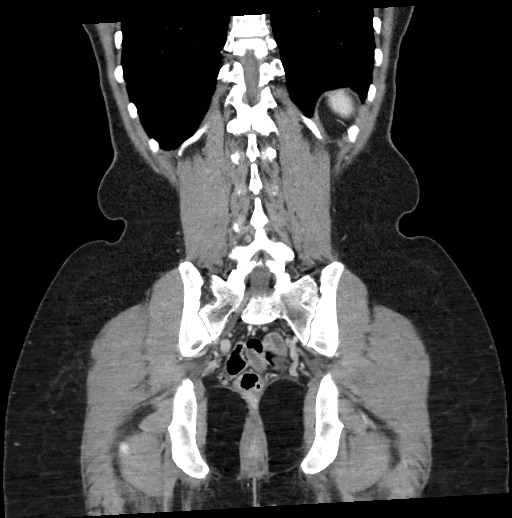
[im 47/106  soft-tissue]
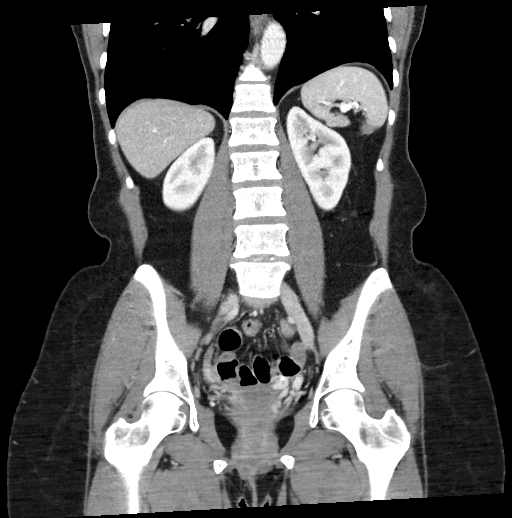
[im 59/106  soft-tissue]
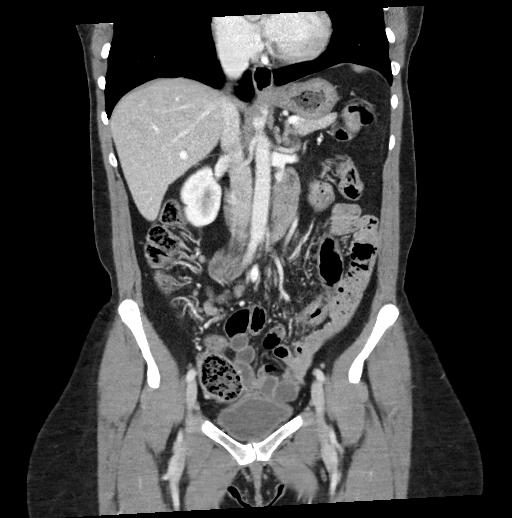

[17 of 46 positions shown; findings below may reference images not displayed]

RADIATION DOSE REDUCTION: This exam was performed according to the
departmental dose-optimization program which includes automated
exposure control, adjustment of the mA and/or kV according to
patient size and/or use of iterative reconstruction technique.

CONTRAST:  100mL OMNIPAQUE IOHEXOL 300 MG/ML  SOLN
FINDINGS: Lower chest: No acute abnormality.

Hepatobiliary: No focal liver abnormality is seen. No gallstones,
gallbladder wall thickening, or biliary dilatation.

Pancreas: Unremarkable. No pancreatic ductal dilatation or
surrounding inflammatory changes.

Spleen: Normal in size without focal abnormality.

Adrenals/Urinary Tract: Adrenal glands are unremarkable. Kidneys are
normal, without renal calculi, focal lesion, or hydronephrosis.
Bladder is unremarkable.

Stomach/Bowel: Stomach is within normal limits. Appendix appears
normal. No evidence of bowel wall thickening, distention, or
inflammatory changes.

Vascular/Lymphatic: No significant vascular findings are present. No
enlarged abdominal or pelvic lymph nodes.

Reproductive: Uterus and bilateral adnexa are unremarkable.
Bilateral tubal ligation clips are seen.

Other: No abdominal wall hernia or abnormality. No abdominopelvic
ascites.

Musculoskeletal: No acute or significant osseous findings.
IMPRESSION: No acute or active process within the abdomen or pelvis.
